# Patient Record
Sex: Male | Born: 1975 | Race: White | Hispanic: No | Marital: Married | Smoking: Never smoker
Health system: Southern US, Community
[De-identification: ages and names within clinical notes are randomized; demographics above are authoritative.]

## PROBLEM LIST (undated history)

## (undated) DIAGNOSIS — G4730 Sleep apnea, unspecified: Secondary | ICD-10-CM

## (undated) DIAGNOSIS — I2510 Atherosclerotic heart disease of native coronary artery without angina pectoris: Secondary | ICD-10-CM

## (undated) DIAGNOSIS — M5412 Radiculopathy, cervical region: Principal | ICD-10-CM

## (undated) DIAGNOSIS — R29898 Other symptoms and signs involving the musculoskeletal system: Secondary | ICD-10-CM

## (undated) DIAGNOSIS — I219 Acute myocardial infarction, unspecified: Secondary | ICD-10-CM

## (undated) DIAGNOSIS — Z87442 Personal history of urinary calculi: Secondary | ICD-10-CM

## (undated) DIAGNOSIS — E785 Hyperlipidemia, unspecified: Secondary | ICD-10-CM

## (undated) DIAGNOSIS — E781 Pure hyperglyceridemia: Secondary | ICD-10-CM

## (undated) DIAGNOSIS — F429 Obsessive-compulsive disorder, unspecified: Secondary | ICD-10-CM

## (undated) DIAGNOSIS — F32A Depression, unspecified: Secondary | ICD-10-CM

## (undated) DIAGNOSIS — K219 Gastro-esophageal reflux disease without esophagitis: Secondary | ICD-10-CM

## (undated) DIAGNOSIS — F329 Major depressive disorder, single episode, unspecified: Secondary | ICD-10-CM

## (undated) DIAGNOSIS — I10 Essential (primary) hypertension: Secondary | ICD-10-CM

## (undated) HISTORY — DX: Pure hyperglyceridemia: E78.1

## (undated) HISTORY — PX: TONSILLECTOMY: SUR1361

## (undated) HISTORY — DX: Hyperlipidemia, unspecified: E78.5

## (undated) HISTORY — DX: Radiculopathy, cervical region: M54.12

## (undated) HISTORY — DX: Other symptoms and signs involving the musculoskeletal system: R29.898

---

## 2014-03-08 ENCOUNTER — Encounter (HOSPITAL_COMMUNITY): Payer: Self-pay | Admitting: Emergency Medicine

## 2014-03-08 ENCOUNTER — Emergency Department (HOSPITAL_COMMUNITY): Payer: BC Managed Care – PPO

## 2014-03-08 ENCOUNTER — Emergency Department (HOSPITAL_COMMUNITY)
Admission: EM | Admit: 2014-03-08 | Discharge: 2014-03-09 | Disposition: A | Discharge status: Home / Self Care | Payer: BC Managed Care – PPO | Attending: Emergency Medicine | Admitting: Emergency Medicine

## 2014-03-08 DIAGNOSIS — Z880 Allergy status to penicillin: Secondary | ICD-10-CM | POA: Insufficient documentation

## 2014-03-08 DIAGNOSIS — N200 Calculus of kidney: Secondary | ICD-10-CM

## 2014-03-08 LAB — URINALYSIS, ROUTINE W REFLEX MICROSCOPIC
Bilirubin Urine: NEGATIVE
GLUCOSE, UA: NEGATIVE mg/dL
Hgb urine dipstick: NEGATIVE
LEUKOCYTES UA: NEGATIVE
NITRITE: NEGATIVE
PROTEIN: NEGATIVE mg/dL
Specific Gravity, Urine: 1.02 (ref 1.005–1.030)
Urobilinogen, UA: 0.2 mg/dL (ref 0.0–1.0)
pH: 7 (ref 5.0–8.0)

## 2014-03-08 LAB — CBC WITH DIFFERENTIAL/PLATELET
BASOS ABS: 0 10*3/uL (ref 0.0–0.1)
Basophils Relative: 0 % (ref 0–1)
EOS ABS: 0.1 10*3/uL (ref 0.0–0.7)
EOS PCT: 1 % (ref 0–5)
HCT: 44.6 % (ref 39.0–52.0)
Hemoglobin: 15.3 g/dL (ref 13.0–17.0)
Lymphocytes Relative: 14 % (ref 12–46)
Lymphs Abs: 1.6 10*3/uL (ref 0.7–4.0)
MCH: 27.6 pg (ref 26.0–34.0)
MCHC: 34.3 g/dL (ref 30.0–36.0)
MCV: 80.4 fL (ref 78.0–100.0)
MONO ABS: 0.5 10*3/uL (ref 0.1–1.0)
Monocytes Relative: 4 % (ref 3–12)
Neutro Abs: 9.2 10*3/uL — ABNORMAL HIGH (ref 1.7–7.7)
Neutrophils Relative %: 81 % — ABNORMAL HIGH (ref 43–77)
PLATELETS: 223 10*3/uL (ref 150–400)
RBC: 5.55 MIL/uL (ref 4.22–5.81)
RDW: 12.8 % (ref 11.5–15.5)
WBC: 11.4 10*3/uL — ABNORMAL HIGH (ref 4.0–10.5)

## 2014-03-08 LAB — COMPREHENSIVE METABOLIC PANEL
ALT: 53 U/L (ref 0–53)
AST: 31 U/L (ref 0–37)
Albumin: 4.5 g/dL (ref 3.5–5.2)
Alkaline Phosphatase: 84 U/L (ref 39–117)
Anion gap: 13 (ref 5–15)
BUN: 15 mg/dL (ref 6–23)
CALCIUM: 9.1 mg/dL (ref 8.4–10.5)
CO2: 25 mEq/L (ref 19–32)
Chloride: 104 mEq/L (ref 96–112)
Creatinine, Ser: 1 mg/dL (ref 0.50–1.35)
GFR calc non Af Amer: >90 mL/min (ref >90)
GLUCOSE: 118 mg/dL — AB (ref 70–99)
Potassium: 4 mEq/L (ref 3.7–5.3)
SODIUM: 142 meq/L (ref 137–147)
TOTAL PROTEIN: 7.1 g/dL (ref 6.0–8.3)
Total Bilirubin: 0.3 mg/dL (ref 0.3–1.2)

## 2014-03-08 LAB — LIPASE, BLOOD: LIPASE: 26 U/L (ref 11–59)

## 2014-03-08 MED ORDER — IOHEXOL 300 MG/ML  SOLN
50.0000 mL | Freq: Once | INTRAMUSCULAR | Status: AC | PRN
  Administered 2014-03-08: 50 mL via ORAL

## 2014-03-08 MED ORDER — ONDANSETRON HCL 4 MG PO TABS: 4.0000 mg | ORAL_TABLET | Freq: Four times a day (QID) | ORAL | Status: DC | PRN

## 2014-03-08 MED ORDER — HYDROMORPHONE HCL PF 1 MG/ML IJ SOLN
1.0000 mg | Freq: Once | INTRAMUSCULAR | Status: AC
  Administered 2014-03-08: 1 mg via INTRAVENOUS
  Filled 2014-03-08: qty 1

## 2014-03-08 MED ORDER — MORPHINE SULFATE 4 MG/ML IJ SOLN
4.0000 mg | Freq: Once | INTRAMUSCULAR | Status: AC
  Administered 2014-03-08: 4 mg via INTRAVENOUS
  Filled 2014-03-08: qty 1

## 2014-03-08 MED ORDER — KETOROLAC TROMETHAMINE 30 MG/ML IJ SOLN
30.0000 mg | Freq: Once | INTRAMUSCULAR | Status: AC
  Administered 2014-03-08: 30 mg via INTRAVENOUS
  Filled 2014-03-08: qty 1

## 2014-03-08 MED ORDER — OXYCODONE-ACETAMINOPHEN 5-325 MG PO TABS
1.0000 | ORAL_TABLET | Freq: Once | ORAL | Status: AC
  Administered 2014-03-09: 1 via ORAL
  Filled 2014-03-08: qty 1

## 2014-03-08 MED ORDER — IOHEXOL 300 MG/ML  SOLN
100.0000 mL | Freq: Once | INTRAMUSCULAR | Status: AC | PRN
  Administered 2014-03-08: 100 mL via INTRAVENOUS

## 2014-03-08 MED ORDER — SODIUM CHLORIDE 0.9 % IV BOLUS (SEPSIS)
1000.0000 mL | Freq: Once | INTRAVENOUS | Status: AC
  Administered 2014-03-08: 1000 mL via INTRAVENOUS

## 2014-03-08 MED ORDER — ONDANSETRON HCL 4 MG/2ML IJ SOLN
4.0000 mg | Freq: Once | INTRAMUSCULAR | Status: AC
  Administered 2014-03-08: 4 mg via INTRAMUSCULAR
  Filled 2014-03-08: qty 2

## 2014-03-08 MED ORDER — OXYCODONE-ACETAMINOPHEN 5-325 MG PO TABS: 1.0000 | ORAL_TABLET | Freq: Four times a day (QID) | ORAL | Status: DC | PRN

## 2014-03-08 NOTE — ED Notes (Signed)
Dr. Horton at bedside at this time.  

## 2014-03-08 NOTE — ED Provider Notes (Signed)
CSN: [id number]     Arrival date & time 03/08/14  2019 History   First MD Initiated Contact with Patient 03/08/14 2128     Chief Complaint  Patient presents with  . Abdominal Pain  . Emesis     (Consider location/radiation/quality/duration/timing/severity/associated sxs/prior Treatment) HPI  Patient presents with 2 hours of right sided flank and RLQ pain.  Patient reports acute onset of sharp, radiating right sided flank and RLQ pain.  Rates pain at 10/10.  Nothing makes it better or worse.  States "I can't get comfortable."  Reports nonbilious, nonbloody emesis.  No diarrhea.  Denies dysuria or hematuria.  Denies fevers.  Hx of kidney stones but thinks this is worse.  History reviewed. No pertinent past medical history. Past Surgical History  Procedure Laterality Date  . Tonsillectomy     History reviewed. No pertinent family history. History  Substance Use Topics  . Smoking status: Never Smoker   . Smokeless tobacco: Not on file  . Alcohol Use: No    Review of Systems  Constitutional: Negative.  Negative for fever.  Respiratory: Negative.  Negative for chest tightness and shortness of breath.   Cardiovascular: Negative.  Negative for chest pain.  Gastrointestinal: Positive for nausea, vomiting and abdominal pain. Negative for diarrhea.  Genitourinary: Positive for flank pain. Negative for dysuria and hematuria.  Musculoskeletal: Negative for back pain.  Neurological: Negative for headaches.  All other systems reviewed and are negative.     Allergies  Amoxicillin and Penicillins  Home Medications   Prior to Admission medications   Medication Sig Start Date End Date Taking? Authorizing Provider  ondansetron (ZOFRAN) 4 MG tablet Take 1 tablet (4 mg total) by mouth every 6 (six) hours as needed for nausea or vomiting. 03/08/14   Shon Baton, MD  oxyCODONE-acetaminophen (PERCOCET/ROXICET) 5-325 MG per tablet Take 1-2 tablets by mouth every 6 (six) hours as needed  for moderate pain or severe pain. 03/08/14   Shon Baton, MD   BP 162/104  Pulse 90  Temp(Src) 97.9 F (36.6 C) (Oral)  Resp 16  Ht 5\' 9"  (1.753 m)  Wt 210 lb (95.255 kg)  BMI 31.00 kg/m2  SpO2 97% Physical Exam  Nursing note and vitals reviewed. Constitutional: He is oriented to person, place, and time. He appears well-developed and well-nourished.  Uncomfortable appearing  HENT:  Head: Normocephalic and atraumatic.  Cardiovascular: Normal rate, regular rhythm and normal heart sounds.   No murmur heard. Pulmonary/Chest: Effort normal and breath sounds normal. No respiratory distress. He has no wheezes.  Abdominal: Soft. Bowel sounds are normal. There is tenderness. There is no rebound and no guarding.  RLQ tenderness  Musculoskeletal: He exhibits no edema.  Lymphadenopathy:    He has no cervical adenopathy.  Neurological: He is alert and oriented to person, place, and time.  Skin: Skin is warm and dry.  Psychiatric: He has a normal mood and affect.    ED Course  Procedures (including critical care time) Labs Review Labs Reviewed  CBC WITH DIFFERENTIAL - Abnormal; Notable for the following:    WBC 11.4 (*)    Neutrophils Relative % 81 (*)    Neutro Abs 9.2 (*)    All other components within normal limits  COMPREHENSIVE METABOLIC PANEL - Abnormal; Notable for the following:    Glucose, Bld 118 (*)    All other components within normal limits  URINALYSIS, ROUTINE W REFLEX MICROSCOPIC - Abnormal; Notable for the following:    Ketones, ur  TRACE (*)    All other components within normal limits  URINE CULTURE  LIPASE, BLOOD    Imaging Review Ct Abdomen Pelvis W Contrast  03/08/2014   CLINICAL DATA:  Sudden onset of right flank pain and right lower quadrant abdominal pain. Vomiting.  EXAM: CT ABDOMEN AND PELVIS WITH CONTRAST  TECHNIQUE: Multidetector CT imaging of the abdomen and pelvis was performed using the standard protocol following bolus administration of  intravenous contrast.  CONTRAST:  100 mL of Omnipaque 300 IV contrast  COMPARISON:  None.  FINDINGS: The visualized lung bases are clear.  There appears to be mild fatty infiltration within the liver, with mild sparing seen anteriorly. The spleen is unremarkable in appearance. The liver is otherwise grossly unremarkable. The gallbladder is within normal limits. The pancreas and adrenal glands are unremarkable.  There is mild right-sided hydronephrosis, with diffuse right-sided perinephric fluid and mildly decreased right renal enhancement, and prominence of the right ureter to the level of an obstructing 5 mm stone in the distal right ureter, just above the right vesicoureteral junction. Decreased renal enhancement raises question for underlying pyelonephritis. The left kidney is unremarkable in appearance. No nonobstructing renal stones are identified.  No free fluid is identified. The small bowel is unremarkable in appearance. The stomach is within normal limits. No acute vascular abnormalities are seen.  The appendix is normal in caliber and contains air, without evidence for appendicitis. The colon is unremarkable in appearance.  The bladder is mildly distended and grossly unremarkable. The prostate remains normal in size. No inguinal lymphadenopathy is seen.  No acute osseous abnormalities are identified.  IMPRESSION: 1. Mild right-sided hydronephrosis, with diffuse right-sided perinephric fluid, and an obstructing 5 mm stone in the distal right ureter, just above the right vesicoureteral junction. 2. Mildly decreased right renal enhancement raises question for underlying pyelonephritis. Suggest clinical correlation. 3. Mild fatty infiltration within the liver.   Electronically Signed   By: Roanna RaiderJeffery  Chang M.D.   On: 03/08/2014 23:23     EKG Interpretation None      MDM   Final diagnoses:  Kidney stones    Patient presents with RLQ and right flank pain.  Uncomfortable on exam but nontoxic.  Given  abrupt onset, hight suspicion for kidney stone.  However, appendicitis also a consideration.  Patient given fluids and pain meds.  Labs reassruing.  CT shows 5mm stone in the right distal ureter.  Patient able to orally hydrate and transitioned to PO pain control.  Will refer to urology and given expectant management.  After history, exam, and medical workup I feel the patient has been appropriately medically screened and is safe for discharge home. Pertinent diagnoses were discussed with the patient. Patient was given return precautions.     Shon Batonourtney F Ceonna Frazzini, MD 03/10/14 [phone]

## 2014-03-08 NOTE — ED Notes (Signed)
Radiology tech at bedside giving instructions on oral contrast

## 2014-03-08 NOTE — ED Notes (Signed)
Pt returned from ct scan

## 2014-03-08 NOTE — ED Notes (Signed)
Patient complaining of right lower abdominal pain that started suddenly approximately an hour ago. Also reports vomited x 2.

## 2014-03-08 NOTE — Discharge Instructions (Signed)

## 2014-03-08 NOTE — ED Notes (Signed)
Pt transported to ct scan at this time.

## 2014-03-10 LAB — URINE CULTURE
Colony Count: NO GROWTH
Culture: NO GROWTH

## 2015-08-21 DIAGNOSIS — I219 Acute myocardial infarction, unspecified: Secondary | ICD-10-CM

## 2015-08-21 DIAGNOSIS — I2510 Atherosclerotic heart disease of native coronary artery without angina pectoris: Secondary | ICD-10-CM

## 2015-08-21 HISTORY — DX: Atherosclerotic heart disease of native coronary artery without angina pectoris: I25.10

## 2015-08-21 HISTORY — DX: Acute myocardial infarction, unspecified: I21.9

## 2015-09-15 ENCOUNTER — Encounter (HOSPITAL_COMMUNITY)
Admission: EM | Disposition: A | Discharge status: Home / Self Care | Payer: BLUE CROSS/BLUE SHIELD | Source: Home / Self Care | Attending: Cardiovascular Disease

## 2015-09-15 ENCOUNTER — Inpatient Hospital Stay (HOSPITAL_COMMUNITY)
Admission: EM | Admit: 2015-09-15 | Discharge: 2015-09-18 | DRG: 247 | Disposition: A | Discharge status: Home / Self Care | Payer: BLUE CROSS/BLUE SHIELD | Attending: Cardiovascular Disease | Admitting: Cardiovascular Disease

## 2015-09-15 ENCOUNTER — Encounter (HOSPITAL_COMMUNITY): Payer: Self-pay

## 2015-09-15 ENCOUNTER — Emergency Department (HOSPITAL_COMMUNITY): Payer: BLUE CROSS/BLUE SHIELD

## 2015-09-15 ENCOUNTER — Encounter (HOSPITAL_COMMUNITY)
Admission: EM | Disposition: A | Discharge status: Home / Self Care | Payer: Self-pay | Source: Home / Self Care | Attending: Cardiovascular Disease

## 2015-09-15 DIAGNOSIS — R7303 Prediabetes: Secondary | ICD-10-CM | POA: Diagnosis present

## 2015-09-15 DIAGNOSIS — I213 ST elevation (STEMI) myocardial infarction of unspecified site: Secondary | ICD-10-CM | POA: Diagnosis present

## 2015-09-15 DIAGNOSIS — Z87442 Personal history of urinary calculi: Secondary | ICD-10-CM | POA: Diagnosis not present

## 2015-09-15 DIAGNOSIS — I10 Essential (primary) hypertension: Secondary | ICD-10-CM | POA: Diagnosis not present

## 2015-09-15 DIAGNOSIS — Z6833 Body mass index (BMI) 33.0-33.9, adult: Secondary | ICD-10-CM

## 2015-09-15 DIAGNOSIS — Z955 Presence of coronary angioplasty implant and graft: Secondary | ICD-10-CM | POA: Insufficient documentation

## 2015-09-15 DIAGNOSIS — I119 Hypertensive heart disease without heart failure: Secondary | ICD-10-CM | POA: Diagnosis present

## 2015-09-15 DIAGNOSIS — E781 Pure hyperglyceridemia: Secondary | ICD-10-CM | POA: Diagnosis present

## 2015-09-15 DIAGNOSIS — I2119 ST elevation (STEMI) myocardial infarction involving other coronary artery of inferior wall: Secondary | ICD-10-CM | POA: Diagnosis not present

## 2015-09-15 DIAGNOSIS — R739 Hyperglycemia, unspecified: Secondary | ICD-10-CM | POA: Diagnosis not present

## 2015-09-15 DIAGNOSIS — Z8249 Family history of ischemic heart disease and other diseases of the circulatory system: Secondary | ICD-10-CM | POA: Diagnosis not present

## 2015-09-15 DIAGNOSIS — I2510 Atherosclerotic heart disease of native coronary artery without angina pectoris: Secondary | ICD-10-CM | POA: Diagnosis present

## 2015-09-15 DIAGNOSIS — I425 Other restrictive cardiomyopathy: Secondary | ICD-10-CM | POA: Diagnosis not present

## 2015-09-15 DIAGNOSIS — I1A0 Resistant hypertension: Secondary | ICD-10-CM | POA: Diagnosis present

## 2015-09-15 DIAGNOSIS — E785 Hyperlipidemia, unspecified: Secondary | ICD-10-CM | POA: Diagnosis not present

## 2015-09-15 DIAGNOSIS — Z880 Allergy status to penicillin: Secondary | ICD-10-CM

## 2015-09-15 DIAGNOSIS — I2111 ST elevation (STEMI) myocardial infarction involving right coronary artery: Secondary | ICD-10-CM | POA: Diagnosis present

## 2015-09-15 DIAGNOSIS — E668 Other obesity: Secondary | ICD-10-CM | POA: Diagnosis present

## 2015-09-15 DIAGNOSIS — R7989 Other specified abnormal findings of blood chemistry: Secondary | ICD-10-CM | POA: Diagnosis present

## 2015-09-15 HISTORY — PX: CARDIAC CATHETERIZATION: SHX172

## 2015-09-15 HISTORY — DX: Atherosclerotic heart disease of native coronary artery without angina pectoris: I25.10

## 2015-09-15 HISTORY — DX: Essential (primary) hypertension: I10

## 2015-09-15 HISTORY — DX: Acute myocardial infarction, unspecified: I21.9

## 2015-09-15 LAB — I-STAT TROPONIN, ED: TROPONIN I, POC: 0.03 ng/mL (ref 0.00–0.08)

## 2015-09-15 LAB — COMPREHENSIVE METABOLIC PANEL WITH GFR
ALT: 54 U/L (ref 17–63)
AST: 36 U/L (ref 15–41)
Albumin: 4 g/dL (ref 3.5–5.0)
Alkaline Phosphatase: 66 U/L (ref 38–126)
Anion gap: 14 (ref 5–15)
BUN: 13 mg/dL (ref 6–20)
CO2: 25 mmol/L (ref 22–32)
Calcium: 9.3 mg/dL (ref 8.9–10.3)
Chloride: 101 mmol/L (ref 101–111)
Creatinine, Ser: 0.84 mg/dL (ref 0.61–1.24)
GFR calc Af Amer: >60 mL/min
GFR calc non Af Amer: >60 mL/min
Glucose, Bld: 144 mg/dL — ABNORMAL HIGH (ref 65–99)
Potassium: 4.5 mmol/L (ref 3.5–5.1)
Sodium: 140 mmol/L (ref 135–145)
Total Bilirubin: 0.8 mg/dL (ref 0.3–1.2)
Total Protein: 6.4 g/dL — ABNORMAL LOW (ref 6.5–8.1)

## 2015-09-15 LAB — RAPID URINE DRUG SCREEN, HOSP PERFORMED
Amphetamines: NOT DETECTED
Barbiturates: NOT DETECTED
Benzodiazepines: POSITIVE — AB
Cocaine: NOT DETECTED
Opiates: NOT DETECTED
Tetrahydrocannabinol: NOT DETECTED

## 2015-09-15 LAB — CBC
HEMATOCRIT: 48.6 % (ref 39.0–52.0)
HEMOGLOBIN: 16.3 g/dL (ref 13.0–17.0)
MCH: 27.4 pg (ref 26.0–34.0)
MCHC: 33.5 g/dL (ref 30.0–36.0)
MCV: 81.8 fL (ref 78.0–100.0)
Platelets: 249 10*3/uL (ref 150–400)
RBC: 5.94 MIL/uL — ABNORMAL HIGH (ref 4.22–5.81)
RDW: 13.6 % (ref 11.5–15.5)
WBC: 14.1 10*3/uL — ABNORMAL HIGH (ref 4.0–10.5)

## 2015-09-15 LAB — TROPONIN I: TROPONIN I: 16.7 ng/mL — AB (ref <0.031)

## 2015-09-15 LAB — POCT ACTIVATED CLOTTING TIME: ACTIVATED CLOTTING TIME: 322 s

## 2015-09-15 LAB — POCT I-STAT 3, ART BLOOD GAS (G3+)
ACID-BASE DEFICIT: 3 mmol/L — AB (ref 0.0–2.0)
Bicarbonate: 22.4 mEq/L (ref 20.0–24.0)
O2 SAT: 99 %
PCO2 ART: 40.3 mmHg (ref 35.0–45.0)
TCO2: 24 mmol/L (ref 0–100)
pH, Arterial: 7.352 (ref 7.350–7.450)
pO2, Arterial: 158 mmHg — ABNORMAL HIGH (ref 80.0–100.0)

## 2015-09-15 LAB — PROTIME-INR
INR: 0.97 (ref 0.00–1.49)
Prothrombin Time: 13.1 s (ref 11.6–15.2)

## 2015-09-15 LAB — MRSA PCR SCREENING: MRSA by PCR: NEGATIVE

## 2015-09-15 LAB — APTT: aPTT: 28 seconds (ref 24–37)

## 2015-09-15 SURGERY — LEFT HEART CATH AND CORONARY ANGIOGRAPHY
Anesthesia: LOCAL

## 2015-09-15 MED ORDER — MORPHINE SULFATE (PF) 2 MG/ML IV SOLN
2.0000 mg | INTRAVENOUS | Status: DC | PRN
  Administered 2015-09-15 (×2): 2 mg via INTRAVENOUS
  Filled 2015-09-15 (×3): qty 1

## 2015-09-15 MED ORDER — METOPROLOL TARTRATE 12.5 MG HALF TABLET: 12.5000 mg | ORAL_TABLET | Freq: Four times a day (QID) | ORAL | Status: DC

## 2015-09-15 MED ORDER — METOPROLOL TARTRATE 12.5 MG HALF TABLET
12.5000 mg | ORAL_TABLET | Freq: Two times a day (BID) | ORAL | Status: DC
  Administered 2015-09-15 (×2): 12.5 mg via ORAL
  Filled 2015-09-15 (×2): qty 1

## 2015-09-15 MED ORDER — SODIUM CHLORIDE 0.9 % IV SOLN
INTRAVENOUS | Status: DC
  Administered 2015-09-15 (×2): via INTRAVENOUS

## 2015-09-15 MED ORDER — LIDOCAINE HCL (PF) 1 % IJ SOLN
INTRAMUSCULAR | Status: AC
  Filled 2015-09-15: qty 30

## 2015-09-15 MED ORDER — NITROGLYCERIN IN D5W 200-5 MCG/ML-% IV SOLN
0.0000 ug/min | INTRAVENOUS | Status: DC
  Administered 2015-09-15: 33 ug/min via INTRAVENOUS
  Administered 2015-09-15: 26 ug/min via INTRAVENOUS
  Administered 2015-09-15: 36 ug/min via INTRAVENOUS
  Administered 2015-09-15: 20 ug/min via INTRAVENOUS
  Filled 2015-09-15: qty 250

## 2015-09-15 MED ORDER — TICAGRELOR 90 MG PO TABS
ORAL_TABLET | ORAL | Status: DC | PRN
  Administered 2015-09-15: 180 mg via ORAL

## 2015-09-15 MED ORDER — MIDAZOLAM HCL 2 MG/2ML IJ SOLN
INTRAMUSCULAR | Status: DC | PRN
  Administered 2015-09-15: 2 mg via INTRAVENOUS

## 2015-09-15 MED ORDER — HEPARIN SODIUM (PORCINE) 5000 UNIT/ML IJ SOLN
4000.0000 [IU] | INTRAMUSCULAR | Status: AC
  Administered 2015-09-15: 4000 [IU] via INTRAVENOUS

## 2015-09-15 MED ORDER — MIDAZOLAM HCL 2 MG/2ML IJ SOLN
INTRAMUSCULAR | Status: AC
  Filled 2015-09-15: qty 2

## 2015-09-15 MED ORDER — SODIUM CHLORIDE 0.9 % IV SOLN: 250.0000 mL | INTRAVENOUS | Status: DC | PRN

## 2015-09-15 MED ORDER — TICAGRELOR 90 MG PO TABS
ORAL_TABLET | ORAL | Status: AC
  Filled 2015-09-15: qty 1

## 2015-09-15 MED ORDER — TICAGRELOR 90 MG PO TABS
90.0000 mg | ORAL_TABLET | Freq: Two times a day (BID) | ORAL | Status: DC
  Administered 2015-09-15 – 2015-09-17 (×4): 90 mg via ORAL
  Filled 2015-09-15 (×4): qty 1

## 2015-09-15 MED ORDER — ASPIRIN EC 81 MG PO TBEC
81.0000 mg | DELAYED_RELEASE_TABLET | Freq: Every day | ORAL | Status: DC
  Administered 2015-09-15 – 2015-09-18 (×4): 81 mg via ORAL
  Filled 2015-09-15 (×4): qty 1

## 2015-09-15 MED ORDER — VERAPAMIL HCL 2.5 MG/ML IV SOLN
INTRAVENOUS | Status: AC
  Filled 2015-09-15: qty 2

## 2015-09-15 MED ORDER — ACETAMINOPHEN 325 MG PO TABS
650.0000 mg | ORAL_TABLET | ORAL | Status: DC | PRN
  Administered 2015-09-16 (×2): 650 mg via ORAL
  Filled 2015-09-15 (×2): qty 2

## 2015-09-15 MED ORDER — FENTANYL CITRATE (PF) 100 MCG/2ML IJ SOLN
INTRAMUSCULAR | Status: AC
  Filled 2015-09-15: qty 2

## 2015-09-15 MED ORDER — NITROGLYCERIN 1 MG/10 ML FOR IR/CATH LAB
INTRA_ARTERIAL | Status: AC
  Filled 2015-09-15: qty 10

## 2015-09-15 MED ORDER — METOPROLOL TARTRATE 1 MG/ML IV SOLN
5.0000 mg | Freq: Once | INTRAVENOUS | Status: AC
  Administered 2015-09-15: 5 mg via INTRAVENOUS
  Filled 2015-09-15: qty 5

## 2015-09-15 MED ORDER — NITROGLYCERIN 0.4 MG SL SUBL
0.4000 mg | SUBLINGUAL_TABLET | SUBLINGUAL | Status: DC | PRN
  Administered 2015-09-15 (×2): 0.4 mg via SUBLINGUAL

## 2015-09-15 MED ORDER — HEPARIN SODIUM (PORCINE) 5000 UNIT/ML IJ SOLN
INTRAMUSCULAR | Status: AC
  Filled 2015-09-15: qty 1

## 2015-09-15 MED ORDER — ATORVASTATIN CALCIUM 80 MG PO TABS
80.0000 mg | ORAL_TABLET | Freq: Every day | ORAL | Status: DC
  Administered 2015-09-15 – 2015-09-17 (×3): 80 mg via ORAL
  Filled 2015-09-15 (×3): qty 1

## 2015-09-15 MED ORDER — LISINOPRIL 2.5 MG PO TABS
2.5000 mg | ORAL_TABLET | Freq: Every day | ORAL | Status: DC
  Administered 2015-09-15 – 2015-09-16 (×2): 2.5 mg via ORAL
  Filled 2015-09-15 (×2): qty 1

## 2015-09-15 MED ORDER — SODIUM CHLORIDE 0.9 % IV SOLN
250.0000 mg | INTRAVENOUS | Status: DC | PRN
  Administered 2015-09-15: 1.75 mg/kg/h via INTRAVENOUS

## 2015-09-15 MED ORDER — SODIUM CHLORIDE 0.9 % IV SOLN
INTRAVENOUS | Status: DC
  Administered 2015-09-15: 999 mL via INTRAVENOUS

## 2015-09-15 MED ORDER — SODIUM CHLORIDE 0.9 % IV SOLN
1.7500 mg/kg/h | INTRAVENOUS | Status: AC
  Filled 2015-09-15: qty 250

## 2015-09-15 MED ORDER — BIVALIRUDIN BOLUS VIA INFUSION - CUPID
INTRAVENOUS | Status: DC | PRN
  Administered 2015-09-15: 76.575 mg via INTRAVENOUS

## 2015-09-15 MED ORDER — VERAPAMIL HCL 2.5 MG/ML IV SOLN
INTRA_ARTERIAL | Status: DC | PRN
  Administered 2015-09-15: 06:00:00 via INTRA_ARTERIAL

## 2015-09-15 MED ORDER — HEPARIN (PORCINE) IN NACL 2-0.9 UNIT/ML-% IJ SOLN
INTRAMUSCULAR | Status: AC
  Filled 2015-09-15: qty 1000

## 2015-09-15 MED ORDER — LIDOCAINE HCL (PF) 1 % IJ SOLN
INTRAMUSCULAR | Status: DC | PRN
  Administered 2015-09-15: 07:00:00

## 2015-09-15 MED ORDER — ONDANSETRON HCL 4 MG/2ML IJ SOLN: 4.0000 mg | Freq: Four times a day (QID) | INTRAMUSCULAR | Status: DC | PRN

## 2015-09-15 MED ORDER — BIVALIRUDIN 250 MG IV SOLR
INTRAVENOUS | Status: AC
  Filled 2015-09-15: qty 250

## 2015-09-15 MED ORDER — NITROGLYCERIN IN D5W 200-5 MCG/ML-% IV SOLN
10.0000 ug/min | INTRAVENOUS | Status: DC
  Administered 2015-09-15: 10 ug/min via INTRAVENOUS

## 2015-09-15 MED ORDER — ASPIRIN 81 MG PO CHEW
324.0000 mg | CHEWABLE_TABLET | Freq: Once | ORAL | Status: DC
Start: 2015-09-15 — End: 2015-09-15

## 2015-09-15 MED ORDER — SODIUM CHLORIDE 0.9% FLUSH
3.0000 mL | Freq: Two times a day (BID) | INTRAVENOUS | Status: DC
  Administered 2015-09-15: 10:00:00 via INTRAVENOUS
  Administered 2015-09-15 – 2015-09-16 (×2): 3 mL via INTRAVENOUS

## 2015-09-15 MED ORDER — NITROGLYCERIN IN D5W 200-5 MCG/ML-% IV SOLN
INTRAVENOUS | Status: AC
  Filled 2015-09-15: qty 250

## 2015-09-15 MED ORDER — FENTANYL CITRATE (PF) 100 MCG/2ML IJ SOLN
INTRAMUSCULAR | Status: DC | PRN
  Administered 2015-09-15 (×2): 50 ug via INTRAVENOUS

## 2015-09-15 MED ORDER — TICAGRELOR 90 MG PO TABS
ORAL_TABLET | ORAL | Status: AC
Start: 2015-09-15 — End: 2015-09-15
  Filled 2015-09-15: qty 1

## 2015-09-15 MED ORDER — SODIUM CHLORIDE 0.9% FLUSH: 3.0000 mL | INTRAVENOUS | Status: DC | PRN

## 2015-09-15 SURGICAL SUPPLY — 20 items
BALLN EUPHORA RX 2.0X12 (BALLOONS) ×2
BALLN ~~LOC~~ TREK RX 3.75X12 (BALLOONS) ×2
BALLOON EUPHORA RX 2.0X12 (BALLOONS) ×1 IMPLANT
BALLOON ~~LOC~~ TREK RX 3.75X12 (BALLOONS) ×1 IMPLANT
CATH INFINITI 5 FR JL3.5 (CATHETERS) ×2 IMPLANT
CATH INFINITI 5FR MULTPACK ANG (CATHETERS) ×2 IMPLANT
CATH VISTA GUIDE 6FR JR4 (CATHETERS) ×2 IMPLANT
DEVICE RAD COMP TR BAND LRG (VASCULAR PRODUCTS) ×2 IMPLANT
GLIDESHEATH SLEND A-KIT 6F 22G (SHEATH) ×2 IMPLANT
KIT ENCORE 26 ADVANTAGE (KITS) ×2 IMPLANT
KIT HEART LEFT (KITS) ×2 IMPLANT
PACK CARDIAC CATHETERIZATION (CUSTOM PROCEDURE TRAY) ×2 IMPLANT
SHEATH PINNACLE 6F 10CM (SHEATH) ×2 IMPLANT
STENT XIENCE ALPINE RX 3.5X15 (Permanent Stent) ×2 IMPLANT
SYR MEDRAD MARK V 150ML (SYRINGE) ×2 IMPLANT
TRANSDUCER W/STOPCOCK (MISCELLANEOUS) ×2 IMPLANT
TUBING CIL FLEX 10 FLL-RA (TUBING) ×2 IMPLANT
WIRE COUGAR XT STRL 190CM (WIRE) ×2 IMPLANT
WIRE EMERALD 3MM-J .035X150CM (WIRE) IMPLANT
WIRE SAFE-T 1.5MM-J .035X260CM (WIRE) ×2 IMPLANT

## 2015-09-15 NOTE — ED Notes (Signed)
Code stemi ([facility] called @ [phone]).

## 2015-09-15 NOTE — Progress Notes (Signed)
Called regarding CP Pt given IV MSO4 and Lopressor IV Pain now almost gone BP 120  HR 110    Lungs CTA   Cardiac  RRR  No rub  No S3 Ext:  Feet warm  No edema  Will continue lopressor  Increase to 12.5 qid   Continue NTG.

## 2015-09-15 NOTE — Progress Notes (Signed)
At shift change, pt complained of 7/10 chest pain. EKG was performed and MD notified. Titrated nitroglycerin and administered morphine. Pt commented decreased pain 3/10. MD called back and recommended metoprolol to be given.

## 2015-09-15 NOTE — ED Notes (Signed)
To cath lab.

## 2015-09-15 NOTE — Care Management Note (Signed)
Case Management Note  Patient Details  Name: WOODIE LINDENMEYER MRN: [id number] Date of Birth: [birth year]  Subjective/Objective:      Adm w mi              Action/Plan: lives at home   Expected Discharge Date:                  Expected Discharge Plan:  Home/Self Care  In-House Referral:     Discharge planning Services  CM Consult, Medication Assistance  Post Acute Care Choice:    Choice offered to:     DME Arranged:    DME Agency:     HH Arranged:    HH Agency:     Status of Service:     Medicare Important Message Given:    Date Medicare IM Given:    Medicare IM give by:    Date Additional Medicare IM Given:    Additional Medicare Important Message give by:     If discussed at Long Length of Stay Meetings, dates discussed:    Additional Comments: ur review done, gave pt 30day free and copay card for brilinta. Pt has bc[employer] ins.  Hanley Hays, RN 09/15/2015, 9:55 AM

## 2015-09-15 NOTE — ED Notes (Signed)
Pt here for cp, arms and neck pain, hx of htn, 205/130 today, pt denies sob, dizzy or nausea ekg reads stemi, md notified.

## 2015-09-15 NOTE — ED Notes (Signed)
Pt reports onset of pain 1.5 hours ago,

## 2015-09-15 NOTE — Progress Notes (Signed)
CRITICAL VALUE ALERT  Critical value received:  Troponin 16.70  Date of notification:  09/15/2015  Time of notification:  1035  Critical value read back:Yes.    Nurse who received alert:  Morrie Sheldon  MD notified (1st page):  Expected results s/p STEMI Cath  Time of first page:  1030  MD notified (2nd page):  Time of second page:  Responding MD:  Expected results s/p STEMI Cath  Time MD responded:  1030

## 2015-09-15 NOTE — H&P (Addendum)
HPI:  40 y/o male with h/o obesity, HTN, hypertriglyceridemia and kidney stones. No known h/o CAD.   Woke up at approximately 345a with severe chest pain radiating to his back and both arms. No N/V or HF symptoms. Presented to [facility] ER and ECG c/w acute inferior STEMI. On arrival, SBP 210/115. Treated with ASA, heparin, NTG. POC troponin 0.03.  Denies recent bleeding.   Review of Systems:     Cardiac Review of Systems: {Y] = yes [ ]  = no  Chest Pain [ y   ]  Resting SOB [   ] Exertional SOB  [  ]  Orthopnea [  ]   Pedal Edema [   ]    Palpitations [  ] Syncope  [  ]   Presyncope [   ]  General Review of Systems: [Y] = yes [  ]=no Constitional: recent weight change [  ]; anorexia [  ]; fatigue [  ]; nausea [  ]; night sweats [  ]; fever [  ]; or chills [  ];                                                                      Dental: poor dentition[  ];   Eye : blurred vision [  ]; diplopia [   ]; vision changes [  ];  Amaurosis fugax[  ]; Resp: cough [  ];  wheezing[  ];  hemoptysis[  ]; shortness of breath[  ]; paroxysmal nocturnal dyspnea[  ]; dyspnea on exertion[  ]; or orthopnea[  ];  GI:  gallstones[  ], vomiting[  ];  dysphagia[  ]; melena[  ];  hematochezia [  ]; heartburn[  ];   GU: kidney stones [  ]; hematuria[  ];   dysuria [  ];  nocturia[  ];               Skin: rash [  ], swelling[  ];, hair loss[  ];  peripheral edema[  ];  or itching[  ]; Musculosketetal: myalgias[  ];  joint swelling[  ];  joint erythema[  ];  joint pain[  ];  back pain[  ];  Heme/Lymph: bruising[  ];  bleeding[  ];  anemia[  ];  Neuro: TIA[  ];  headaches[  ];  stroke[  ];  vertigo[  ];  seizures[  ];   paresthesias[  ];  difficulty walking[  ];  Psych:depression[  ]; anxiety[  ];  Endocrine: diabetes[  ];  thyroid dysfunction[  ];  Other:  Past Medical History  Diagnosis Date  . Hypertension    Home Meds:  - NONE  Allergies  Allergen Reactions  . Amoxicillin Rash  . Penicillins Rash     Social History   Social History  . Marital Status: Single    Spouse Name: N/A  . Number of Children: N/A  . Years of Education: N/A   Occupational History  . Not on file.   Social History Main Topics  . Smoking status: Never Smoker   . Smokeless tobacco: Not on file  . Alcohol Use: No  . Drug Use: No  . Sexual Activity: Not on file   Other Topics Concern  .  Not on file   Social History Narrative    Family History:  +CAD in father (stents)   PHYSICAL EXAM: Filed Vitals:   09/15/15 0450 09/15/15 0452  BP: 181/110 191/121  Pulse: 125 123  Temp:    Resp: 20 13   General: Obese young male uncomfortable from CP HEENT: normal Neck: supple. Thick. No obvious  JVD. Carotids 2+ bilat; no bruits. No lymphadenopathy or thryomegaly appreciated. Cor: PMI nondisplaced. Regular rate & rhythm. No rubs, gallops or murmurs. Lungs: clear Abdomen: obese soft, nontender, nondistended. No hepatosplenomegaly. No bruits or masses. Good bowel sounds. Extremities: no cyanosis, clubbing, rash, edema Neuro: alert & oriented x 3, cranial nerves grossly intact. moves all 4 extremities w/o difficulty. Affect pleasant.  ECG: Sinus tach 115 inferior ST elevation  Results for orders placed or performed during the hospital encounter of 09/15/15 (from the past 24 hour(s))  CBC     Status: Abnormal   Collection Time: 09/15/15  4:43 AM  Result Value Ref Range   WBC 14.1 (H) 4.0 - 10.5 K/uL   RBC 5.94 (H) 4.22 - 5.81 MIL/uL   Hemoglobin 16.3 13.0 - 17.0 g/dL   HCT 16.1 09.6 - 04.5 %   MCV 81.8 78.0 - 100.0 fL   MCH 27.4 26.0 - 34.0 pg   MCHC 33.5 30.0 - 36.0 g/dL   RDW 40.9 81.1 - 91.4 %   Platelets 249 150 - 400 K/uL  I-Stat Troponin, ED (not at [facility])     Status: None   Collection Time: 09/15/15  4:43 AM  Result Value Ref Range   Troponin i, poc 0.03 0.00 - 0.08 ng/mL   Comment 3           Dg Chest Portable 1 View  09/15/2015  CLINICAL DATA:  Acute onset of mid chest pain.   Initial encounter. EXAM: PORTABLE CHEST 1 VIEW COMPARISON:  None. FINDINGS: The lungs are mildly hypoexpanded. Mild vascular crowding and vascular congestion are noted. There is no evidence of focal opacification, pleural effusion or pneumothorax. The cardiomediastinal silhouette is within normal limits. No acute osseous abnormalities are seen. IMPRESSION: Lungs mildly hypoexpanded. Mild vascular congestion noted. Lungs remain grossly clear. Electronically Signed   By: Roanna Raider M.D.   On: 09/15/2015 04:58     ASSESSMENT:  1. Acute inferior ST elevation MI 2. Severe HTN, likely worsened by #1 3. Morbid obesity  PLAN/DISCUSSION:  ECG is consistent with acute inferior STEMI. He has been treated with ASA, heparin and NTG in ER. We will take to cath lab for emergent PCI. Suspect severe HTN due in part to pain, Start IV b-blocker and IV NTG gtt.. Check lipids. Will check UDS. Routine post-STEMI care to follow. With severe HTN, aortic dissection involving RCA also in the differential. Will assess at cath.   Richard Pizzo,MD 5:04 AM     Patient seen and examined. [name] is a 40 year old white male with a history of mild obesity.  In the past, he has been told to have marked upper triglyceridemia with triglycerides >1200, but was not started on medication and he wanted to initially  pursue a change in diet.  With diet change his triglycerides improved to  the 300 range.  He also has a history of hypertension but has not been on any medications.  There is a family history for coronary artery disease with his father having had several stents and as well as paternal grandfather.  He denies any history of  prior chest pain.  This morning approximately 3:30 AM he was awakened with severe substernal chest pressure.  Upon arrival to [facility] emergency room his ECG revealed sinus tachycardia with early ST segment inferior elevation and a code STEMI was activated. In the ER  the patient was treated with IV  nitroglycerin and heparin.  His chest pain improved.  He is taken acutely to the catheterization laboratory for emergent cardiac catheterization.  Social history is notable in that he is married and has 3 children.  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  There is no tobacco history.  Family history is notable that both parents are alive, father at age 18 and mother at 6.  His father had suffered several cardiac events and has had several stents.  The paternal grandfather also had myocardial infarction.  He has one sister age 41 who is alive and well.  ROS are otherwise negative. On physical exam, he was hypertensive on arrival.  He was mildly obese.  HEENT was unremarkable except for a Mallinpatti scale of 3.  His lungs were clear.  Rhythm was regular.  There was no gallop.  There was no murmur.  Abdomen was soft and nontender.  Pulses were 2+.  Distal pulses are 2+.  There was no clubbing cyanosis or edema.  Neurologic exam was nonfocal.  I discussed emergent catheterization with the patient who agrees to his procedure in an emergent fashion.   Lennette Bihari, MD, [facility] 09/15/2015 6:57 AM

## 2015-09-15 NOTE — ED Provider Notes (Signed)
CSN: [id number]     Arrival date & time 09/15/15  0424 History   First MD Initiated Contact with Patient 09/15/15 [phone]     Chief Complaint  Patient presents with  . Code STEMI      Patient is a 40 y.o. male presenting with chest pain. The history is provided by the patient.  Chest Pain Pain location:  Substernal area Pain quality: pressure   Radiates to: back. Pain radiates to the back: yes   Pain severity:  Severe Onset quality:  Sudden Duration:  1 hour Timing:  Constant Progression:  Worsening Chronicity:  New Relieved by:  Nothing Worsened by:  Nothing tried Ineffective treatments:  Aspirin Associated symptoms: shortness of breath   Associated symptoms: no fever   Risk factors: hypertension and male sex   Risk factors: no prior DVT/PE   patient reports he woke up one hour with chest pain.  It seems to radiate into back and he also has pain in both arms.  He has never had this before.    Family history - CAD Past Medical History  Diagnosis Date  . Hypertension    Past Surgical History  Procedure Laterality Date  . Tonsillectomy     History reviewed. No pertinent family history. Social History  Substance Use Topics  . Smoking status: Never Smoker   . Smokeless tobacco: None  . Alcohol Use: No    Review of Systems  Constitutional: Negative for fever.  Respiratory: Positive for shortness of breath.   Cardiovascular: Positive for chest pain.  Gastrointestinal: Negative for blood in stool.  Neurological: Negative for syncope.  All other systems reviewed and are negative.     Allergies  Amoxicillin and Penicillins  Home Medications   Prior to Admission medications   Medication Sig Start Date End Date Taking? Authorizing Provider  ondansetron (ZOFRAN) 4 MG tablet Take 1 tablet (4 mg total) by mouth every 6 (six) hours as needed for nausea or vomiting. 03/08/14   Shon Baton, MD  oxyCODONE-acetaminophen (PERCOCET/ROXICET) 5-325 MG per tablet Take  1-2 tablets by mouth every 6 (six) hours as needed for moderate pain or severe pain. 03/08/14   Shon Baton, MD   BP 205/130 mmHg  Pulse 124  Temp(Src) 98.8 F (37.1 C) (Oral)  Resp 21  Ht 5\' 9"  (1.753 m)  Wt 102.059 kg  BMI 33.21 kg/m2  SpO2 99% Physical Exam CONSTITUTIONAL: Well developed/well nourished, ill appearing HEAD: Normocephalic/atraumatic EYES: EOMI ENMT: Mucous membranes moist NECK: supple no meningeal signs SPINE/BACK:entire spine nontender CV: S1/S2 noted, no murmurs/rubs/gallops noted, tachycardic LUNGS: Lungs are clear to auscultation bilaterally, no apparent distress ABDOMEN: soft, nontender, no rebound or guarding, bowel sounds noted throughout abdomen GU:no cva tenderness NEURO: Pt is awake/alert/appropriate, moves all extremitiesx4.  No facial droop.   EXTREMITIES: pulses normal/equalx4, full ROM, no LE edema or tenderness SKIN: warm, color normal PSYCH: mildly anxious  ED Course  Procedures  CRITICAL CARE Performed by: Joya Gaskins Total critical care time: 35 minutes Critical care time was exclusive of separately billable procedures and treating other patients. Critical care was necessary to treat or prevent imminent or life-threatening deterioration. Critical care was time spent personally by me on the following activities: development of treatment plan with patient and/or surrogate as well as nursing, discussions with consultants, evaluation of patient's response to treatment, examination of patient, obtaining history from patient or surrogate, ordering and performing treatments and interventions, ordering and review of laboratory studies, ordering and review of radiographic  studies, pulse oximetry and re-evaluation of patient's condition.  Labs Review Labs Reviewed  CBC - Abnormal; Notable for the following:    WBC 14.1 (*)    RBC 5.94 (*)    All other components within normal limits  APTT  PROTIME-INR  COMPREHENSIVE METABOLIC PANEL   URINE RAPID DRUG SCREEN, HOSP PERFORMED  I-STAT TROPOININ, ED    Imaging Review Dg Chest Portable 1 View  09/15/2015  CLINICAL DATA:  Acute onset of mid chest pain.  Initial encounter. EXAM: PORTABLE CHEST 1 VIEW COMPARISON:  None. FINDINGS: The lungs are mildly hypoexpanded. Mild vascular crowding and vascular congestion are noted. There is no evidence of focal opacification, pleural effusion or pneumothorax. The cardiomediastinal silhouette is within normal limits. No acute osseous abnormalities are seen. IMPRESSION: Lungs mildly hypoexpanded. Mild vascular congestion noted. Lungs remain grossly clear. Electronically Signed   By: Roanna Raider M.D.   On: 09/15/2015 04:58   I have personally reviewed and evaluated these images and lab results as part of my medical decision-making.   EKG Interpretation   Date/Time:  Thursday September 15 2015 04:35:23 EST Ventricular Rate:  115 PR Interval:  153 QRS Duration: 108 QT Interval:  347 QTC Calculation: 480 R Axis:   96 Text Interpretation:  Sinus or ectopic atrial tachycardia Inferior  infarct, acute (LCx) Borderline ST elevation, anterior leads Lateral leads  are also involved ** ** ACUTE MI / STEMI ** ** Confirmed by Bebe Shaggy  MD,  Dorinda Hill ([id number]) on 09/15/2015 4:38:04 AM     As soon as I saw the EKG I ordered STEMI workup and asked secretary to page code stemi I spoke to Dr Tresa Endo, STEMI physician at 0444am and informed him of this patient  Cath lab has been activated 5:17 AM Pt with some improved in blood pressure and also his CP He is now going off to cardiac cath lab BP 155/103 mmHg  Pulse 117  Temp(Src) 98.8 F (37.1 C) (Oral)  Resp 20  Ht  (1.753 m)  Wt 102.059 kg  BMI 33.21 kg/m2  SpO2 97%   Medications  0.9 %  sodium chloride infusion (999 mLs Intravenous New Bag/Given 09/15/15 0449)  aspirin chewable tablet 324 mg (324 mg Oral Not Given 09/15/15 0450)  nitroGLYCERIN (NITROSTAT) SL tablet 0.4 mg (0.4 mg Sublingual  Given 09/15/15 0447)  nitroGLYCERIN 50 mg in dextrose 5 % 250 mL (0.2 mg/mL) infusion (10 mcg/min Intravenous New Bag/Given 09/15/15 0512)  nitroGLYCERIN 0.2 mg/mL in dextrose 5 % infusion (not administered)  heparin injection 4,000 Units (4,000 Units Intravenous Given 09/15/15 0447)  metoprolol (LOPRESSOR) injection 5 mg (5 mg Intravenous Given 09/15/15 0509)    MDM   Final diagnoses:  ST elevation myocardial infarction (STEMI), unspecified artery [facility])    Nursing notes including past medical history and social history reviewed and considered in documentation xrays/imaging reviewed by myself and considered during evaluation Labs/vital reviewed myself and considered during evaluation     Zadie Rhine, MD 09/15/15 [phone]

## 2015-09-16 ENCOUNTER — Encounter (HOSPITAL_COMMUNITY): Payer: Self-pay | Admitting: Physician Assistant

## 2015-09-16 DIAGNOSIS — R739 Hyperglycemia, unspecified: Secondary | ICD-10-CM

## 2015-09-16 DIAGNOSIS — E785 Hyperlipidemia, unspecified: Secondary | ICD-10-CM | POA: Diagnosis present

## 2015-09-16 LAB — BASIC METABOLIC PANEL
ANION GAP: 13 (ref 5–15)
BUN: 8 mg/dL (ref 6–20)
CHLORIDE: 104 mmol/L (ref 101–111)
CO2: 20 mmol/L — AB (ref 22–32)
Calcium: 8.7 mg/dL — ABNORMAL LOW (ref 8.9–10.3)
Creatinine, Ser: 0.81 mg/dL (ref 0.61–1.24)
GFR calc non Af Amer: >60 mL/min (ref >60)
GLUCOSE: 133 mg/dL — AB (ref 65–99)
Potassium: 3.9 mmol/L (ref 3.5–5.1)
Sodium: 137 mmol/L (ref 135–145)

## 2015-09-16 LAB — CBC
HEMATOCRIT: 40.8 % (ref 39.0–52.0)
HEMOGLOBIN: 13.7 g/dL (ref 13.0–17.0)
MCH: 27.6 pg (ref 26.0–34.0)
MCHC: 33.6 g/dL (ref 30.0–36.0)
MCV: 82.1 fL (ref 78.0–100.0)
Platelets: 223 10*3/uL (ref 150–400)
RBC: 4.97 MIL/uL (ref 4.22–5.81)
RDW: 13.9 % (ref 11.5–15.5)
WBC: 10.9 10*3/uL — ABNORMAL HIGH (ref 4.0–10.5)

## 2015-09-16 LAB — POCT I-STAT, CHEM 8
BUN: 14 mg/dL (ref 6–20)
CALCIUM ION: 1.16 mmol/L (ref 1.12–1.23)
CREATININE: 0.7 mg/dL (ref 0.61–1.24)
Chloride: 105 mmol/L (ref 101–111)
Glucose, Bld: 166 mg/dL — ABNORMAL HIGH (ref 65–99)
HEMATOCRIT: 47 % (ref 39.0–52.0)
Hemoglobin: 16 g/dL (ref 13.0–17.0)
Potassium: 3.9 mmol/L (ref 3.5–5.1)
Sodium: 139 mmol/L (ref 135–145)
TCO2: 22 mmol/L (ref 0–100)

## 2015-09-16 LAB — LIPID PANEL
CHOL/HDL RATIO: 6.8 ratio
Cholesterol: 216 mg/dL — ABNORMAL HIGH (ref 0–200)
HDL: 32 mg/dL — ABNORMAL LOW (ref >40)
LDL Cholesterol: UNDETERMINED mg/dL (ref 0–99)
Triglycerides: 635 mg/dL — ABNORMAL HIGH (ref <150)
VLDL: UNDETERMINED mg/dL (ref 0–40)

## 2015-09-16 MED ORDER — LISINOPRIL 2.5 MG PO TABS
2.5000 mg | ORAL_TABLET | Freq: Once | ORAL | Status: AC
  Administered 2015-09-16: 2.5 mg via ORAL
  Filled 2015-09-16: qty 1

## 2015-09-16 MED ORDER — METOPROLOL TARTRATE 1 MG/ML IV SOLN
2.5000 mg | Freq: Once | INTRAVENOUS | Status: AC
  Administered 2015-09-16: 2.5 mg via INTRAVENOUS
  Filled 2015-09-16: qty 5

## 2015-09-16 MED ORDER — METOPROLOL TARTRATE 25 MG PO TABS
25.0000 mg | ORAL_TABLET | Freq: Four times a day (QID) | ORAL | Status: DC
  Administered 2015-09-16: 25 mg via ORAL

## 2015-09-16 MED ORDER — LISINOPRIL 5 MG PO TABS
5.0000 mg | ORAL_TABLET | Freq: Every day | ORAL | Status: DC
  Administered 2015-09-17: 5 mg via ORAL
  Filled 2015-09-16: qty 1

## 2015-09-16 MED ORDER — ACETAMINOPHEN 325 MG PO TABS: 650.0000 mg | ORAL_TABLET | Freq: Four times a day (QID) | ORAL | Status: DC | PRN

## 2015-09-16 MED ORDER — METOPROLOL TARTRATE 25 MG PO TABS
25.0000 mg | ORAL_TABLET | Freq: Two times a day (BID) | ORAL | Status: DC
  Administered 2015-09-16: 25 mg via ORAL
  Filled 2015-09-16: qty 1

## 2015-09-16 MED ORDER — METOPROLOL TARTRATE 12.5 MG HALF TABLET
12.5000 mg | ORAL_TABLET | Freq: Four times a day (QID) | ORAL | Status: DC
  Administered 2015-09-16: 12.5 mg via ORAL
  Filled 2015-09-16 (×2): qty 1

## 2015-09-16 NOTE — Discharge Instructions (Signed)
IMPORTANT SAFETY INFORMATION FOR BRILINTA (ticagrelor) TABLETS  WARNING: (A) BLEEDING RISK, (B) ASPIRIN DOSE AND BRILINTA EFFECTIVENESS A. BLEEDING RISK BRILINTA, like other antiplatelet agents, can cause significant, sometimes fatal bleeding  Do not use BRILINTA in patients with active pathological bleeding or a history of intracranial hemorrhage  Do not start BRILINTA in patients undergoing urgent coronary artery bypass graft surgery  If possible, manage bleeding without discontinuing BRILINTA. Stopping BRILINTA increases the risk of subsequent cardiovascular events B. ASPIRIN DOSE AND BRILINTA EFFECTIVENESS Maintenance doses of aspirin above 100 mg reduce the effectiveness of BRILINTA and should be avoided CONTRAINDICATIONS BRILINTA is contraindicated in patients with a history of intracranial hemorrhage or active pathological bleeding such as peptic ulcer or intracranial hemorrhage. BRILINTA is also contraindicated in patients with hypersensitivity (eg, angioedema) to ticagrelor or any component of the product WARNINGS AND PRECAUTIONS Dyspnea was reported in about 14% of patients treated with BRILINTA, more frequently than in patients treated with control agents. Dyspnea resulting from BRILINTA is often self-limiting  Discontinuation of BRILINTA will increase the risk of MI, stroke, and death. When possible, interrupt therapy with BRILINTA for 5 days prior to surgery that has a major risk of bleeding. If BRILINTA must be temporarily discontinued, restart as soon as possible  Ticagrelor can cause ventricular pauses. Bradyarrhythmias including AV block have been reported in the post-marketing setting.  Avoid use of BRILINTA in patients with severe hepatic impairment. Severe hepatic impairment is likely to increase serum concentration of ticagrelor and there are no studies of BRILINTA in these patients ADVERSE REACTIONS The most common adverse reactions associated with the use of BRILINTA included  bleeding and dyspnea (shortness of breath) DRUG INTERACTIONS Avoid use with strong CYP3A inhibitors and strong CYP3A inducers. BRILINTA is metabolized by CYP3A4/5. Strong inhibitors substantially increase ticagrelor exposure and so increase the risk of adverse events. Strong inducers substantially reduce ticagrelor exposure and so decrease the efficacy of ticagrelor. Please check with your doctor or pharmacist prior to taking new or over the counter medications. Patients receiving more than 40 mg per day of simvastatin or lovastatin may be at increased risk of statin-related adverse events  Monitor digoxin levels with initiation of, or change in, BRILINTA therapy  INDICATIONS BRILINTA is indicated to reduce the rate of cardiovascular death, myocardial infarction (MI), and stroke in patients with acute coronary syndrome (ACS) or a history of myocardial infarction. For at least the first 12 months following ACS, it is superior to clopidogrel. BRILINTA also reduces the rate of stent thrombosis in patients who have been stented for treatment of ACS.

## 2015-09-16 NOTE — Progress Notes (Addendum)
Patient Name: Richard Hull Date of Encounter: 09/16/2015  Principal Problem:   ST elevation (STEMI) myocardial infarction involving right coronary artery [facility]) Active Problems:   ST elevation myocardial infarction (STEMI) of inferior wall, initial episode of care (HCC)   Hypertension, malignant   Hyperglycemia   Hyperlipidemia LDL goal <70   Primary Cardiologist: New  Patient Profile: 40 yo male w/ hx HTN, hypertriglycerides and nephrolithiasis was admitted 01/26 w/ STEMI, ruptured plaque in RCA w/ distal embolization, s/p DES PDA.  SUBJECTIVE: Chest pain greatly improved, no SOB, no palpitations.   OBJECTIVE Filed Vitals:   09/16/15 0500 09/16/15 0530 09/16/15 0600 09/16/15 0630  BP: 135/86 124/86 137/86 131/83  Pulse: 100 98 94 97  Temp:      TempSrc:      Resp: 26 27 25 19   Height:      Weight:      SpO2: 96% 96% 96% 97%    Intake/Output Summary (Last 24 hours) at 09/16/15 0726 Last data filed at 09/16/15 0600  Gross per 24 hour  Intake 2988.09 ml  Output   2775 ml  Net 213.09 ml   Filed Weights   09/15/15 0440  Weight: 225 lb (102.059 kg)    PHYSICAL EXAM General: Well developed, well nourished, male in no acute distress. Head: Normocephalic, atraumatic.  Neck: Supple without bruits, JVD not elevated. Lungs:  Resp regular and unlabored, CTA. Heart: RRR, S1, S2, no S3, S4, or murmur; no rub. Abdomen: Soft, non-tender, non-distended, BS + x 4.  Extremities: No clubbing, cyanosis, edema. R radial cath site without ecchymosis or hematoma Neuro: Alert and oriented X 3. Moves all extremities spontaneously. Psych: Normal affect.  LABS: CBC:  Recent Labs  09/15/15 0443 09/16/15 0335  WBC 14.1* 10.9*  HGB 16.3 13.7  HCT 48.6 40.8  MCV 81.8 82.1  PLT 249 223   INR:  Recent Labs  09/15/15 0443  INR 0.97   Basic Metabolic Panel:  Recent Labs  13/14/38 0443 09/16/15 0335  NA 140 137  K 4.5 3.9  CL 101 104  CO2 25 20*  GLUCOSE 144*  133*  BUN 13 8  CREATININE 0.84 0.81  CALCIUM 9.3 8.7*   Liver Function Tests:  Recent Labs  09/15/15 0443  AST 36  ALT 54  ALKPHOS 66  BILITOT 0.8  PROT 6.4*  ALBUMIN 4.0   Cardiac Enzymes:  Recent Labs  09/15/15 0855  TROPONINI 16.70*    Recent Labs  09/15/15 0443  TROPIPOC 0.03   Drugs of Abuse     Component Value Date/Time   LABOPIA NONE DETECTED 09/15/2015 1155   COCAINSCRNUR NONE DETECTED 09/15/2015 1155   LABBENZ POSITIVE* 09/15/2015 1155   AMPHETMU NONE DETECTED 09/15/2015 1155   THCU NONE DETECTED 09/15/2015 1155   LABBARB NONE DETECTED 09/15/2015 1155    TELE:   SR, ST w/ 4 bt run NSVT     ECG: 09/16/2015 ST, rate 101 Inferior Q waves  CATH: 09/15/2015  Mid LAD to Dist LAD lesion, 30% stenosed.  Ost RCA to Prox RCA lesion, 30% stenosed.  Dist RCA lesion, 30% stenosed. Post intervention, there is a 0% residual stenosis.  RPDA lesion, 99% stenosed. Post intervention, there is a 5% residual stenosis.  The left ventricular systolic function is normal. Acute inferior ST segment elevation myocardial infarction secondary to an ulcerated plaque in the distal RCA with evidence for distal embolization to the mid PDA vessel. Coronary anatomy demonstrating a normal left main  coronary artery, large LAD vessel with 30% smooth mid narrowing after the second diagonal branch; normal ramus intermediate vessel; normal left circumflex coronary artery; and large dominant RCA with mild 30% narrowing, an ulcerated plaque beyond the acute margin before the PDA takeoff and 99% distal PDA stenosis. Successful acute coronary intervention to the RCA with PTCA of the PDA vessel with a 99% stenosis being reduced to 5%, and primary stenting of the ulcerated plaque being reduced to 0% with insertion of a 3.515 mm Xience Alpine DES stent postdilated to 3.71 mm. RECOMMENDATION: The patient will be on dual antiplatelet therapy for minimum of 1 year. He will need to be treated  with aggressive lipid lowering therapy as well as blood pressure control with beta blocker and ACE-I.   Radiology/Studies: Dg Chest Portable 1 View 09/15/2015  CLINICAL DATA:  Acute onset of mid chest pain.  Initial encounter. EXAM: PORTABLE CHEST 1 VIEW COMPARISON:  None. FINDINGS: The lungs are mildly hypoexpanded. Mild vascular crowding and vascular congestion are noted. There is no evidence of focal opacification, pleural effusion or pneumothorax. The cardiomediastinal silhouette is within normal limits. No acute osseous abnormalities are seen. IMPRESSION: Lungs mildly hypoexpanded. Mild vascular congestion noted. Lungs remain grossly clear. Electronically Signed   By: Roanna Raider M.D.   On: 09/15/2015 04:58     Current Medications:  . aspirin EC  81 mg Oral Daily  . atorvastatin  80 mg Oral q1800  . lisinopril  2.5 mg Oral Daily  . metoprolol tartrate  12.5 mg Oral 4 times per day  . sodium chloride flush  3 mL Intravenous Q12H  . ticagrelor  90 mg Oral BID   . sodium chloride 999 mL (09/15/15 0449)  . sodium chloride Stopped (09/16/15 0500)  . nitroGLYCERIN 36 mcg/min (09/15/15 2304)    ASSESSMENT AND PLAN: Principal Problem:   ST elevation (STEMI) myocardial infarction involving right coronary artery (HCC) - Distal RCA plaque rupture w/ embolization to the PDA, s/p DES - minimal other dz, med rx - on ASA, BB, high-dose statin, Brilinta - had recurrent chest pain last pm, so is on IV NTG, wean and d/c today - cardiac rehab to see  Active Problems:   ST elevation myocardial infarction (STEMI) of inferior wall, initial episode of care (HCC) - see above    Hypertension, malignant - all rx is new - increase metoprolol    Hyperglycemia -  A1c pending    Hyperlipidemia LDL goal <70 - hx elevated triglycerides - needs heart-healthy diet - on high-dose statin - profile pending  Signed, Leanna Battles 7:26 AM 09/16/2015  The patient was seen, examined and  discussed with Theodore Demark, PA-C and I agree with the above.   40 yo male w/ hx HTN, hypertriglycerides and nephrolithiasis was admitted 01/26 w/ STEMI, ruptured plaque in RCA w/ distal embolization, s/p DES PDA. Doing well, chest pain improved, but still ongoing, also chest pain, and overall muscle pain, on NTG drip,we will try to wean off, increase metoprolol to 25 mg po BID for tachycardia and reperfusion nsVTs on telemetry. Continue ASA, Brilinta, atorvastatin, metoprolol, lisinopril. We will keep in CCU for another day.  Lars Masson 09/16/2015

## 2015-09-16 NOTE — Progress Notes (Signed)
CARDIAC REHAB PHASE I   PRE:  Rate/Rhythm: 99 SR    BP: sitting 140/96    SaO2:   MODE:  Ambulation: 350 ft   POST:  Rate/Rhythm: 110 ST    BP: sitting 157/110     SaO2:   Pt c/o sensation in chest, hands and feet when he moves. However he actually felt better getting up and walking. BP very elevated. Ed completed with pt and wife. He generally prefers holistic approach to meds. He was a vegan for 4 years trying to control his triglycerides and cholesterol. It was hard on his family though. Encouraged pt to have good communication with his MD and be serious about meds. Understands importance of Brilinta. Will send referral to [facility]. Pt would eventually like to get back to running.  [phone]   Elissa Lovett Hamlin CES, ACSM 09/16/2015 12:37 PM

## 2015-09-16 NOTE — Plan of Care (Signed)
Problem: Phase I Progression Outcomes Goal: Pain controlled with appropriate interventions Outcome: Progressing Tylenol for HA. Morphine for chest pain. Repositioning non-pharmacological.  Goal: Hemodynamically stable Outcome: Progressing Maintaining SBP < 140 and HR < 110 with meds.

## 2015-09-17 LAB — HEMOGLOBIN A1C
HEMOGLOBIN A1C: 6.2 % — AB (ref 4.8–5.6)
Mean Plasma Glucose: 131 mg/dL

## 2015-09-17 LAB — TSH: TSH: 5.921 u[IU]/mL — ABNORMAL HIGH (ref 0.350–4.500)

## 2015-09-17 MED ORDER — METOPROLOL TARTRATE 50 MG PO TABS
50.0000 mg | ORAL_TABLET | Freq: Two times a day (BID) | ORAL | Status: DC
  Administered 2015-09-17 – 2015-09-18 (×3): 50 mg via ORAL
  Filled 2015-09-17 (×3): qty 1

## 2015-09-17 NOTE — Progress Notes (Signed)
       Patient Name: Richard Hull Date of Encounter: 09/17/2015    SUBJECTIVE: Acute inferior myocardial infarction treated with intervention that included distal RCA stent and PDA angioplasty. Significant underlying history of hypertension. Also known hypertriglyceridemia. Patient does not smoke but has a significant family history of coronary disease. He denies residual chest discomfort and has not had dyspnea. He has no complaints concerning access sites.  TELEMETRY:  Normal sinus rhythm without significant arrhythmia Filed Vitals:   09/17/15 0500 09/17/15 0600 09/17/15 0700 09/17/15 0739  BP: 163/117 154/115 169/114   Pulse:      Temp:    98.9 F (37.2 C)  TempSrc:    Oral  Resp:      Height:      Weight:      SpO2:        Intake/Output Summary (Last 24 hours) at 09/17/15 0803 Last data filed at 09/17/15 0500  Gross per 24 hour  Intake 1084.75 ml  Output   3150 ml  Net -2065.25 ml   LABS: Basic Metabolic Panel:  Recent Labs  63/89/37 0443 09/15/15 0544 09/16/15 0335  NA 140 139 137  K 4.5 3.9 3.9  CL 101 105 104  CO2 25  --  20*  GLUCOSE 144* 166* 133*  BUN 13 14 8   CREATININE 0.84 0.70 0.81  CALCIUM 9.3  --  8.7*   CBC:  Recent Labs  09/15/15 0443 09/15/15 0544 09/16/15 0335  WBC 14.1*  --  10.9*  HGB 16.3 16.0 13.7  HCT 48.6 47.0 40.8  MCV 81.8  --  82.1  PLT 249  --  223   Cardiac Enzymes:  Recent Labs  09/15/15 0855  TROPONINI 16.70*   BNP: Invalid input(s): POCBNP Hemoglobin A1C:  Recent Labs  09/16/15 0335  HGBA1C 6.2*   Fasting Lipid Panel:  Recent Labs  09/16/15 0335  CHOL 216*  HDL 32*  LDLCALC UNABLE TO CALCULATE IF TRIGLYCERIDE OVER 400 mg/dL  TRIG 342*  CHOLHDL 6.8    Radiology/Studies:  Chest x-ray on admission revealed mild vascular congestion.  Physical Exam: Blood pressure 169/114, pulse 99, temperature 98.9 F (37.2 C), temperature source Oral, resp. rate 20, height 5\' 9"  (1.753 m), weight 225 lb  (102.059 kg), SpO2 97 %. Weight change:   Wt Readings from Last 3 Encounters:  09/15/15 225 lb (102.059 kg)  03/08/14 210 lb (95.255 kg)   S4 gallop on cardiac exam Lungs are clear to auscultation and percussion Extremities reveal no edema. Right radial access site is unremarkable HEENT exam is unremarkable Neurological exam is normal  ASSESSMENT:  1. Low risk acute inferior wall myocardial infarction, with excellent postintervention result. 2. Normal left ventricular systolic function 3. Hypertension not well controlled  Plan:  1. Echocardiogram to assess for left ventricular hypertrophy 2. Aggressive blood pressure control 3. Dual antiplatelet therapy 4. Aggressive antilipid therapy 5. Probable discharge in a.m. 6. Cardiac rehabilitation  Signed, Lesleigh Noe 09/17/2015, 8:03 AM

## 2015-09-17 NOTE — Plan of Care (Signed)
Problem: Phase I Progression Outcomes Goal: Hemodynamically stable Outcome: Progressing Continuing to monitor elevated BP.

## 2015-09-18 ENCOUNTER — Inpatient Hospital Stay (HOSPITAL_COMMUNITY): Payer: BLUE CROSS/BLUE SHIELD

## 2015-09-18 DIAGNOSIS — R7989 Other specified abnormal findings of blood chemistry: Secondary | ICD-10-CM | POA: Diagnosis present

## 2015-09-18 DIAGNOSIS — Z955 Presence of coronary angioplasty implant and graft: Secondary | ICD-10-CM | POA: Insufficient documentation

## 2015-09-18 DIAGNOSIS — I425 Other restrictive cardiomyopathy: Secondary | ICD-10-CM

## 2015-09-18 LAB — T4, FREE: Free T4: 0.91 ng/dL (ref 0.61–1.12)

## 2015-09-18 MED ORDER — LISINOPRIL 20 MG PO TABS
20.0000 mg | ORAL_TABLET | Freq: Every day | ORAL | Status: DC
  Administered 2015-09-18: 20 mg via ORAL
  Filled 2015-09-18: qty 1

## 2015-09-18 MED ORDER — TICAGRELOR 90 MG PO TABS: 90.0000 mg | ORAL_TABLET | Freq: Two times a day (BID) | ORAL | Status: DC

## 2015-09-18 MED ORDER — METOPROLOL TARTRATE 50 MG PO TABS: 50.0000 mg | ORAL_TABLET | Freq: Two times a day (BID) | ORAL | Status: DC

## 2015-09-18 MED ORDER — LISINOPRIL 20 MG PO TABS: 20.0000 mg | ORAL_TABLET | Freq: Every day | ORAL | Status: DC

## 2015-09-18 MED ORDER — ASPIRIN 81 MG PO TBEC: 81.0000 mg | DELAYED_RELEASE_TABLET | Freq: Every day | ORAL | Status: AC

## 2015-09-18 MED ORDER — NITROGLYCERIN 0.4 MG SL SUBL: 0.4000 mg | SUBLINGUAL_TABLET | SUBLINGUAL | Status: DC | PRN

## 2015-09-18 MED ORDER — ATORVASTATIN CALCIUM 80 MG PO TABS: 80.0000 mg | ORAL_TABLET | Freq: Every day | ORAL | Status: DC

## 2015-09-18 MED ORDER — TICAGRELOR 90 MG PO TABS
90.0000 mg | ORAL_TABLET | Freq: Two times a day (BID) | ORAL | Status: DC
  Administered 2015-09-18: 90 mg via ORAL
  Filled 2015-09-18: qty 1

## 2015-09-18 NOTE — Progress Notes (Signed)
Echocardiogram 2D Echocardiogram has been performed.  Richard Hull 09/18/2015, 10:16 AM

## 2015-09-18 NOTE — Progress Notes (Signed)
    Subjective: Some chest discomfort from coughing.   Objective: Vital signs in last 24 hours: Temp:  [97.9 F (36.6 C)-98.9 F (37.2 C)] 97.9 F (36.6 C) (01/29 0412) Pulse Rate:  [83-101] 83 (01/29 0412) Resp:  [16-20] 19 (01/29 0412) BP: (135-176)/(79-111) 136/79 mmHg (01/29 0412) SpO2:  [96 %-99 %] 98 % (01/29 0412) Weight:  [229 lb 15 oz (104.3 kg)-230 lb 6.4 oz (104.509 kg)] 229 lb 15 oz (104.3 kg) (01/29 0412) Last BM Date: 09/16/15  Intake/Output from previous day: 01/28 0701 - 01/29 0700 In: 180 [P.O.:180] Out: -  Intake/Output this shift:    Medications Scheduled Meds: . aspirin EC  81 mg Oral Daily  . atorvastatin  80 mg Oral q1800  . lisinopril  5 mg Oral Daily  . metoprolol tartrate  50 mg Oral BID   Continuous Infusions:  PRN Meds:.acetaminophen, nitroGLYCERIN, ondansetron (ZOFRAN) IV  PE: General appearance: alert, cooperative and no distress Lungs: clear to auscultation bilaterally Heart: regular rate and rhythm, S1, S2 normal, no murmur, click, rub or gallop Extremities: No LEE Pulses: 2+ and symmetric Skin: Warm and dry Neurologic: Grossly normal  Lab Results:   Recent Labs  09/16/15 0335  WBC 10.9*  HGB 13.7  HCT 40.8  PLT 223   BMET  Recent Labs  09/16/15 0335  NA 137  K 3.9  CL 104  CO2 20*  GLUCOSE 133*  BUN 8  CREATININE 0.81  CALCIUM 8.7*   PT/INR No results for input(s): LABPROT, INR in the last 72 hours. Cholesterol  Recent Labs  09/16/15 0335  CHOL 216*   Lipid Panel     Component Value Date/Time   CHOL 216* 09/16/2015 0335   TRIG 635* 09/16/2015 0335   HDL 32* 09/16/2015 0335   CHOLHDL 6.8 09/16/2015 0335   VLDL UNABLE TO CALCULATE IF TRIGLYCERIDE OVER 400 mg/dL 45/62/5638 9373   LDLCALC UNABLE TO CALCULATE IF TRIGLYCERIDE OVER 400 mg/dL 42/87/6811 5726     Cardiac Panel (last 3 results)  Recent Labs  09/15/15 0855  TROPONINI 16.70*     Assessment/Plan  Principal Problem:   ST elevation  (STEMI) myocardial infarction involving right coronary artery (HCC)   Hypertension, malignant   Hyperglycemia   Hyperlipidemia LDL goal <70   Elevated TSH  SP PCI to the distal RCA and angioplasty to the RPDA.  ASA, brilinta,  lipitor 80, lisinopril 5 and metoprolol 50 bid.  BP still elevated.  Increase lisinopril to 20mg .  Consider renal artery dopplers and other secondary causes for HTN.  Check Free T3/T4 as TSH is elevated.  Triglycerides 635.  Ambulate with Cardiac Rehab this morning. Prediabetes based on A1C(6.2) dietary modifications discussed.  Echo today.  DC to home after echo.    LOS: 3 days    Reida Hem PA-C 09/18/2015 7:26 AM

## 2015-09-18 NOTE — Discharge Summary (Signed)
Discharge Summary    Patient ID: Richard Hull,  MRN: [id number], DOB/AGE: 39-Jul-1977 39 y.o.  Admit date: 09/15/2015 Discharge date: 09/21/2015  Primary Care Provider: Gwen Pounds Primary Cardiologist: Tresa Endo  Discharge Diagnoses    Principal Problem:   ST elevation (STEMI) myocardial infarction involving right coronary artery [facility]) Active Problems:   Hypertension, malignant   Hyperglycemia   Hyperlipidemia LDL goal <70   Stented coronary artery   Elevated TSH    Allergies Allergies  Allergen Reactions  . Amoxicillin Rash  . Penicillins Rash    Diagnostic Studies/Procedures    Procedures    Coronary Stent Intervention   Left Heart Cath and Coronary Angiography    Conclusion     Mid LAD to Dist LAD lesion, 30% stenosed.  Ost RCA to Prox RCA lesion, 30% stenosed.  Dist RCA lesion, 30% stenosed. Post intervention, there is a 0% residual stenosis.  RPDA lesion, 99% stenosed. Post intervention, there is a 5% residual stenosis.  The left ventricular systolic function is normal.  Acute inferior ST segment elevation myocardial infarction secondary to an ulcerated plaque in the distal RCA with evidence for distal embolization to the mid PDA vessel.  Coronary anatomy demonstrating a normal left main coronary artery, large LAD vessel with 30% smooth mid narrowing after the second diagonal branch; normal ramus intermediate vessel; normal left circumflex coronary artery; and large dominant RCA with mild 30% narrowing, an ulcerated plaque beyond the acute margin before the PDA takeoff and 99% distal PDA stenosis.  Successful acute coronary intervention to the RCA with PTCA of the PDA vessel with a 99% stenosis being reduced to 5%, and primary stenting of the ulcerated plaque being reduced to 0% with insertion of a 3.515 mm Xience Alpine DES stent postdilated to 3.71 mm.  RECOMMENDATION: The patient will be on dual antiplatelet therapy for minimum of 1 year. He  will need to be treated with aggressive lipid lowering therapy as well as blood pressure control with beta blocker and ACE-I.    Diagnostic Diagram                                              Post-Intervention Diagram                   Echocardiogram Study Conclusions  - Left ventricle: The cavity size was normal. There was severe focal basal and moderate concentric hypertrophy of the let ventricle. Systolic function was vigorous. The estimated ejection fraction was in the range of 65% to 70%. Wall motion was normal; there were no regional wall motion abnormalities. Doppler parameters are consistent with abnormal left ventricular relaxation (grade 1 diastolic dysfunction). Doppler parameters are consistent with elevated ventricular end-diastolic filling pressure. - Aortic valve: There was no regurgitation. - Aortic root: The aortic root was normal in size. - Mitral valve: There was no regurgitation. - Left atrium: The atrium was normal in size. - Right ventricle: Systolic function was normal. - Right atrium: The atrium was normal in size. - Tricuspid valve: There was trivial regurgitation. - Pulmonic valve: There was no regurgitation. - Pulmonary arteries: Systolic pressure was within the normal range. - Inferior vena cava: The vessel was normal in size. - Pericardium, extracardiac: There was no pericardial effusion.  Impressions:  - The study more consistent with hypertensive heart disease rather than hypertrophic cardiomyopathy.  _____________   History of Present Illness     40 y/o male with h/o obesity, HTN, hypertriglyceridemia and kidney stones. No known h/o CAD.   Woke up at approximately 345a with severe chest pain radiating to his back and both arms. No N/V or HF symptoms. Presented to [facility] ER and ECG c/w acute inferior STEMI. On arrival, SBP 210/115. Treated with ASA, heparin, NTG. POC troponin 0.03.  Hospital Course       Consultants: Cardiac rehab   Patient was admitted and taken directly to the Cath Lab. It should with aspirin, heparin and nitroglycerin in the emergency room. He was started on IV beta blocker in addition to nitroglycerin for severe hypertension.  He was also started high-dose statin, ACE inhibitor, which was subsequently increased to 20 mg. He was transitioned to by mouth lopressor, which was later increased to 50 mg twice daily.  His left heart catheterization revealed Mid LAD to Dist LAD lesion, 30% stenosed. Ost RCA to Prox RCA lesion, 30% stenosed. Dist RCA lesion, 30% stenosed. He underwent stent placement of the distal ulcerated plaque in the RCA.   Post intervention, there is a 0% residual stenosis.  RPDA lesion, 99% stenosed. Post angioplasty, there was a 5% residual stenosis.  The left ventricular systolic function is normal.  He was seen by cardiac rehabilitation. He did have an echocardiogram to assess the degree of LV hypertrophy.  It revealed ejection fraction of 65-70% with normal wall motion. Severe focal basal and moderate concentric hypertrophy of the LV. Grade 1 diastolic dysfunction. Trivial TR.  He did miss the PM dose of brilinta on 1/28 which was given at 0817hrs the following day.  Dietary modifications were discussed.  The patient reports he was vegan for about five years, but in the last few months was not adhering to the diet.  Patient's TSH was mildly elevated.  We checked a free T3 and T4. The patient was seen by Dr. Katrinka Blazing who felt he was stable for DC home.  Outpatient needs:  LFTs and lipids in 6 weeks  _____________  Discharge Vitals Blood pressure 162/101, pulse 98, temperature 97.9 F (36.6 C), temperature source Oral, resp. rate 19, height 5\' 9"  (1.753 m), weight 229 lb 15 oz (104.3 kg), SpO2 98 %.  Filed Weights   09/15/15 0440 09/17/15 0948 09/18/15 0412  Weight: 225 lb (102.059 kg) 230 lb 6.4 oz (104.509 kg) 229 lb 15 oz (104.3 kg)    Labs & Radiologic Studies       Dg Chest Portable 1 View  09/15/2015  CLINICAL DATA:  Acute onset of mid chest pain.  Initial encounter. EXAM: PORTABLE CHEST 1 VIEW COMPARISON:  None. FINDINGS: The lungs are mildly hypoexpanded. Mild vascular crowding and vascular congestion are noted. There is no evidence of focal opacification, pleural effusion or pneumothorax. The cardiomediastinal silhouette is within normal limits. No acute osseous abnormalities are seen. IMPRESSION: Lungs mildly hypoexpanded. Mild vascular congestion noted. Lungs remain grossly clear. Electronically Signed   By: Roanna Raider M.D.   On: 09/15/2015 04:58    Disposition   Pt is being discharged home today in good condition.  Follow-up Plans & Appointments    Follow-up Information    Follow up with Lennette Bihari, MD.   Specialty:  Cardiology   Why:  The office scheduler will call you with the appt date and time.    Contact information:   [address] [place] [address] [phone]  Discharge Instructions    AMB Referral to Cardiac Rehabilitation - Phase II    Complete by:  As directed   Diagnosis:  Myocardial Infarction     Amb Referral to Cardiac Rehabilitation    Complete by:  As directed   Diagnosis:   Myocardial Infarction PCI       Diet - low sodium heart healthy    Complete by:  As directed      Increase activity slowly    Complete by:  As directed            Discharge Medications   Discharge Medication List as of 09/18/2015 12:23 PM    START taking these medications   Details  aspirin EC 81 MG EC tablet Take 1 tablet (81 mg total) by mouth daily., Starting 09/18/2015, Until Discontinued, No Print    atorvastatin (LIPITOR) 80 MG tablet Take 1 tablet (80 mg total) by mouth daily at 6 PM., Starting 09/18/2015, Until Discontinued, Normal    lisinopril (PRINIVIL,ZESTRIL) 20 MG tablet Take 1 tablet (20 mg total) by mouth daily., Starting 09/18/2015, Until Discontinued, Normal    metoprolol  (LOPRESSOR) 50 MG tablet Take 1 tablet (50 mg total) by mouth 2 (two) times daily., Starting 09/18/2015, Until Discontinued, Normal    nitroGLYCERIN (NITROSTAT) 0.4 MG SL tablet Place 1 tablet (0.4 mg total) under the tongue every 5 (five) minutes as needed for chest pain (CP or SOB)., Starting 09/18/2015, Until Discontinued, Normal      CONTINUE these medications which have CHANGED   Details  ticagrelor (BRILINTA) 90 MG TABS tablet Take 1 tablet (90 mg total) by mouth 2 (two) times daily., Starting 09/18/2015, Until Discontinued, Normal      STOP taking these medications     ondansetron (ZOFRAN) 4 MG tablet      oxyCODONE-acetaminophen (PERCOCET/ROXICET) 5-325 MG per tablet          Aspirin prescribed at discharge?  Yes High Intensity Statin Prescribed? (Lipitor 40-80mg  or Crestor 20-40mg ): Yes Beta Blocker Prescribed? Yes For EF 45% or less, Was ACEI/ARB Prescribed? No: EF normal ADP Receptor Inhibitor Prescribed? (i.e. Plavix etc.-Includes Medically Managed Patients): Yes For EF <40%, Aldosterone Inhibitor Prescribed? No:  Was EF assessed during THIS hospitalization? Yes Was Cardiac Rehab II ordered? (Included Medically managed Patients): Yes   Outstanding Labs/Studies   Free T3, free T4  Duration of Discharge Encounter   Greater than 30 minutes including physician time.  Risa Grill, Zuha Dejonge PAC 09/21/2015, 1:45 PM

## 2015-09-19 LAB — T3, FREE: T3, Free: 2.4 pg/mL (ref 2.0–4.4)

## 2015-09-20 ENCOUNTER — Telehealth: Payer: Self-pay | Admitting: Physician Assistant

## 2015-09-20 MED FILL — Bivalirudin For IV Soln 250 MG: INTRAVENOUS | Qty: 250 | Status: AC

## 2015-09-20 MED FILL — Sodium Chloride IV Soln 0.9%: INTRAVENOUS | Qty: 50 | Status: AC

## 2015-09-20 NOTE — Telephone Encounter (Signed)
New problem    Pt has 7-10 TCM w/Rhonda on 2.7.17.per staff message.

## 2015-09-20 NOTE — Telephone Encounter (Signed)
Patient contacted regarding discharge from  [facility] on January 29,2017.   Patient understands to follow up with provider  Yes. Noralyn Pick, PA on 09/27/15 at 9:00 at [facility] office.  Patient understands discharge instructions?  yes Patient understands medications and regiment?  yes Patient understands to bring all medications to this visit?  yes

## 2015-09-27 ENCOUNTER — Encounter: Payer: Self-pay | Admitting: Physician Assistant

## 2015-09-27 ENCOUNTER — Ambulatory Visit (INDEPENDENT_AMBULATORY_CARE_PROVIDER_SITE_OTHER): Payer: BLUE CROSS/BLUE SHIELD | Admitting: Physician Assistant

## 2015-09-27 ENCOUNTER — Other Ambulatory Visit: Payer: Self-pay | Admitting: Internal Medicine

## 2015-09-27 VITALS — BP 130/90 | HR 80 | Ht 69.0 in | Wt 231.0 lb

## 2015-09-27 DIAGNOSIS — R29898 Other symptoms and signs involving the musculoskeletal system: Secondary | ICD-10-CM

## 2015-09-27 DIAGNOSIS — I10 Essential (primary) hypertension: Secondary | ICD-10-CM

## 2015-09-27 DIAGNOSIS — I159 Secondary hypertension, unspecified: Secondary | ICD-10-CM | POA: Diagnosis not present

## 2015-09-27 DIAGNOSIS — E785 Hyperlipidemia, unspecified: Secondary | ICD-10-CM

## 2015-09-27 DIAGNOSIS — I2119 ST elevation (STEMI) myocardial infarction involving other coronary artery of inferior wall: Secondary | ICD-10-CM

## 2015-09-27 NOTE — Patient Instructions (Signed)
Medication Instructions:  Your physician recommends that you continue on your current medications as directed. Please refer to the Current Medication list given to you today.   Labwork: Your physician recommends that you return for lab work in: Before next appt FASTING (lipid/liver) The lab can be found on the FIRST FLOOR of out building in [address]   Testing/Procedures: none  Follow-Up: Your physician recommends that you schedule a follow-up appointment in: 3 months with Dr. Tresa Endo   Any Other Special Instructions Will Be Listed Below (If Applicable).     If you need a refill on your cardiac medications before your next appointment, please call your pharmacy.

## 2015-09-27 NOTE — Progress Notes (Signed)
Cardiology Office Note   Date:  09/27/2015   ID:  Richard Hull, DOB [birth year], MRN [id number]  PCP:  Gwen Pounds, MD  Cardiologist:  Dr Venora Maples, PA-C   History of Present Illness: Richard Hull is a 40 y.o. male with a history of STEMI w/ DES RCA (d/c 01/29), HTN, HLD w/ elev trig, elev TSH (mild)  Richard Hull presents for posthospital follow-up.  Since discharge from the hospital, Mr. Richard Hull has improved. For the first few days, all he did was sleep. He never felt rested. That improved and now he feels pretty normal. He does not feel that his activity level is back up to where it was, but he is doing better. He is tolerating his medications well. He has not had chest pain. He has not had shortness of breath. He is not lightheaded or dizzy. He is trying to be compliant with low cholesterol diet. He was no fecal increase his activity. He wants to know when he can return to work. He was to know when he can drive.  He has an issue that involves his left arm (cath was done from the right radial). His left arm has been weak and movement is decreased as well since the cath. When he tries to lift over 10 pounds, he just cannot do it. He has seen his primary care physician for this, and MRI is planned to rule out embolic cause.  He has also had a dry cough. He was advised by his primary care physician that this could be secondary to the lisinopril. It is not bothering him very much right now and he will follow-up with Dr. Kortland Lasso if he feels he needs to change that medication.   Past Medical History  Diagnosis Date  . Hypertension   . Coronary artery disease 08/2015  . Myocardial infarction (HCC) 08/2015  . Hyperlipidemia LDL goal <70   . Hypertriglyceridemia     Past Surgical History  Procedure Laterality Date  . Tonsillectomy    . Cardiac catheterization N/A 09/15/2015    Procedure: Left Heart Cath and Coronary Angiography;  Surgeon: Lennette Bihari, MD; LAD 30%,  pRCA 30%, dRCA 30%, RPDA 99%, nl LV function   . Cardiac catheterization N/A 09/15/2015    Procedure: Coronary Stent Intervention;  Surgeon: Lennette Bihari, MD; 3.515 mm Xience Alpine DES to dRCA/RPDA>> 5% residual       Current Outpatient Prescriptions  Medication Sig Dispense Refill  . aspirin EC 81 MG EC tablet Take 1 tablet (81 mg total) by mouth daily.    Marland Kitchen atorvastatin (LIPITOR) 80 MG tablet Take 1 tablet (80 mg total) by mouth daily at 6 PM. 30 tablet 11  . lisinopril (PRINIVIL,ZESTRIL) 20 MG tablet Take 1 tablet (20 mg total) by mouth daily. 30 tablet 11  . metoprolol (LOPRESSOR) 50 MG tablet Take 1 tablet (50 mg total) by mouth 2 (two) times daily. 60 tablet 11  . nitroGLYCERIN (NITROSTAT) 0.4 MG SL tablet Place 1 tablet (0.4 mg total) under the tongue every 5 (five) minutes as needed for chest pain (CP or SOB). 25 tablet 12  . Omega-3 Fatty Acids (FISH OIL) 1000 MG CAPS Take 1,000 mg by mouth 2 (two) times daily.    . Saw Palmetto, Serenoa repens, (SAW PALMETTO PO) Take 150 mg by mouth daily.    . ticagrelor (BRILINTA) 90 MG TABS tablet Take 1 tablet (90 mg total) by mouth 2 (two) times daily. 60  tablet 11  . vitamin C (ASCORBIC ACID) 500 MG tablet Take 500 mg by mouth 2 (two) times daily.     No current facility-administered medications for this visit.    Allergies:   Amoxicillin and Penicillins    Social History:  The patient  reports that he has never smoked. He does not have any smokeless tobacco history on file. He reports that he does not drink alcohol or use illicit drugs.   Family History:  The patient's family history includes Heart attack (age of onset: 41) in his father; Hypertension in his father, maternal grandfather, maternal grandmother, paternal grandfather, and paternal grandmother; Stroke in his father and paternal grandmother.    ROS:  Please see the history of present illness. All other systems are reviewed and negative.    PHYSICAL EXAM: VS:  BP 130/90  mmHg  Pulse 80  Ht 5\' 9"  (1.753 m)  Wt 231 lb (104.781 kg)  BMI 34.10 kg/m2 , BMI Body mass index is 34.1 kg/(m^2). GEN: Well nourished, well developed, male in no acute distress HEENT: normal for age  Neck: no JVD, no carotid bruit, no masses Cardiac: RRR; no murmur, no rubs, or gallops Respiratory:  clear to auscultation bilaterally, normal work of breathing GI: soft, nontender, nondistended, + BS MS: no deformity or atrophy; no edema; distal pulses are 2+ in all 4 extremities  Skin: warm and dry, no rash Neuro:  Strength and sensation are intact, except in the left arm which has weakness. Psych: euthymic mood, full affect   EKG:  EKG is ordered today. The ekg ordered today demonstrates sinus rhythm, inferior Q waves are noted Inferior T waves are inverted, consistent with recent MI  ECHO: 09/18/2015 - Left ventricle: The cavity size was normal. There was severe focal basal and moderate concentric hypertrophy of the let ventricle. Systolic function was vigorous. The estimated ejection fraction was in the range of 65% to 70%. Wall motion was normal; there were no regional wall motion abnormalities. Doppler parameters are consistent with abnormal left ventricular relaxation (grade 1 diastolic dysfunction). Doppler parameters are consistent with elevated ventricular end-diastolic filling pressure. - Aortic valve: There was no regurgitation. - Aortic root: The aortic root was normal in size. - Mitral valve: There was no regurgitation. - Left atrium: The atrium was normal in size. - Right ventricle: Systolic function was normal. - Right atrium: The atrium was normal in size. - Tricuspid valve: There was trivial regurgitation. - Pulmonic valve: There was no regurgitation. - Pulmonary arteries: Systolic pressure was within the normal range. - Inferior vena cava: The vessel was normal in size. - Pericardium, extracardiac: There was no pericardial  effusion. Impressions: - The study more consistent with hypertensive heart disease rather than hypertrophic cardiomyopathy.  CATH: 09/15/2015  Mid LAD to Dist LAD lesion, 30% stenosed.  Ost RCA to Prox RCA lesion, 30% stenosed.  Dist RCA lesion, 30% stenosed. Post intervention, there is a 0% residual stenosis.  RPDA lesion, 99% stenosed. Post intervention, there is a 5% residual stenosis.  The left ventricular systolic function is normal. Acute inferior ST segment elevation myocardial infarction secondary to an ulcerated plaque in the distal RCA with evidence for distal embolization to the mid PDA vessel. Coronary anatomy demonstrating a normal left main coronary artery, large LAD vessel with 30% smooth mid narrowing after the second diagonal branch; normal ramus intermediate vessel; normal left circumflex coronary artery; and large dominant RCA with mild 30% narrowing, an ulcerated plaque beyond the acute margin before  the PDA takeoff and 99% distal PDA stenosis. Successful acute coronary intervention to the RCA with PTCA of the PDA vessel with a 99% stenosis being reduced to 5%, and primary stenting of the ulcerated plaque being reduced to 0% with insertion of a 3.515 mm Xience Alpine DES stent postdilated to 3.71 mm. RECOMMENDATION: The patient will be on dual antiplatelet therapy for minimum of 1 year. He will need to be treated with aggressive lipid lowering therapy as well as blood pressure control with beta blocker and ACE-I.   Recent Labs: 09/15/2015: ALT 54 09/16/2015: BUN 8; Creatinine, Ser 0.81; Hemoglobin 13.7; Platelets 223; Potassium 3.9; Sodium 137 09/17/2015: TSH 5.921*    Lipid Panel    Component Value Date/Time   CHOL 216* 09/16/2015 0335   TRIG 635* 09/16/2015 0335   HDL 32* 09/16/2015 0335   CHOLHDL 6.8 09/16/2015 0335   VLDL UNABLE TO CALCULATE IF TRIGLYCERIDE OVER 400 mg/dL 86/57/8469 6295   LDLCALC UNABLE TO CALCULATE IF TRIGLYCERIDE OVER 400 mg/dL  28/41/3244 0102     Wt Readings from Last 3 Encounters:  09/27/15 231 lb (104.781 kg)  09/18/15 229 lb 15 oz (104.3 kg)  03/08/14 210 lb (95.255 kg)     Other studies Reviewed: Additional studies/ records that were reviewed today include: Hospital records and testing.  ASSESSMENT AND PLAN:  1.  Inferior STEMI, subsequent episode of care: Mr. Yandell is recovering well from a cardiac standpoint. He is encouraged to continue the aspirin, statin, Brilinta, ACE inhibitor and beta blocker. He is encouraged to increase his activity as directed by inpatient cardiac rehabilitation and follow-up with outpatient cardiac rehabilitation.  History op involves a lot of walking around and he regularly has to lift boxes weighing approximately 50 pounds. Because of this, we will keep him out of work another 3 weeks. In the meantime, he is to work with cardiac rehabilitation on increasing his strength.  2. Left arm weakness: Dr. Anurag Lasso was evaluating him for possible embolic event related to his cardiac cath.  3. Hypertension: His systolic pressures under pretty good control. His diastolic pressure is generally 80-90. I suspect it runs higher when he is up and around and active. He is encouraged to follow a healthy lifestyle, losing weight and becoming fit. If he does this, he may not need any medication changes. Otherwise, consider adding amlodipine or diuretic to decrease his diastolic pressure.  4. Hyperlipidemia with hypertriglyceridemia: He is to remain on the medication and get a lipid profile prior to following up with Dr. Tresa Endo in 3 months. If dietary changes and a medications do not improve his profile, he may need fenofibrate.   Current medicines are reviewed at length with the patient today.  The patient does not have concerns regarding medicines.  The following changes have been made:  no change  Labs/ tests ordered today include:  Orders Placed This Encounter  Procedures  . Hepatic function  panel  . Lipid panel  . EKG 12-Lead     Disposition:   FU with Dr. Tresa Endo  Signed, Leanna Battles  09/27/2015 9:48 AM    [facility] [address] [place], [place], [place]  [address] Phone: [phone]; Fax: [phone]

## 2015-10-03 ENCOUNTER — Telehealth (HOSPITAL_COMMUNITY): Payer: Self-pay | Admitting: *Deleted

## 2015-10-03 NOTE — Telephone Encounter (Signed)
Received Referral for cardiac rehab. Noted in epic pt requested Richard Hull phase II cardiac rehab program.  Faxed referral form to [facility]. Alanson Aly, BSN

## 2015-10-05 ENCOUNTER — Ambulatory Visit
Admission: RE | Admit: 2015-10-05 | Discharge: 2015-10-05 | Disposition: A | Discharge status: Home / Self Care | Payer: BLUE CROSS/BLUE SHIELD | Source: Ambulatory Visit | Attending: Internal Medicine | Admitting: Internal Medicine

## 2015-10-05 ENCOUNTER — Other Ambulatory Visit: Payer: BLUE CROSS/BLUE SHIELD

## 2015-10-05 ENCOUNTER — Other Ambulatory Visit: Payer: Self-pay | Admitting: Internal Medicine

## 2015-10-05 DIAGNOSIS — R29898 Other symptoms and signs involving the musculoskeletal system: Secondary | ICD-10-CM

## 2015-10-05 MED ORDER — GADOBENATE DIMEGLUMINE 529 MG/ML IV SOLN
20.0000 mL | Freq: Once | INTRAVENOUS | Status: AC | PRN
  Administered 2015-10-05: 20 mL via INTRAVENOUS

## 2015-11-02 ENCOUNTER — Encounter (HOSPITAL_COMMUNITY): Payer: Self-pay | Admitting: Occupational Therapy

## 2015-11-02 ENCOUNTER — Ambulatory Visit (HOSPITAL_COMMUNITY): Payer: BLUE CROSS/BLUE SHIELD | Attending: Internal Medicine | Admitting: Occupational Therapy

## 2015-11-02 DIAGNOSIS — M25612 Stiffness of left shoulder, not elsewhere classified: Secondary | ICD-10-CM | POA: Diagnosis present

## 2015-11-02 DIAGNOSIS — M629 Disorder of muscle, unspecified: Secondary | ICD-10-CM | POA: Insufficient documentation

## 2015-11-02 DIAGNOSIS — M6289 Other specified disorders of muscle: Secondary | ICD-10-CM

## 2015-11-02 DIAGNOSIS — M25512 Pain in left shoulder: Secondary | ICD-10-CM

## 2015-11-02 DIAGNOSIS — R29898 Other symptoms and signs involving the musculoskeletal system: Secondary | ICD-10-CM | POA: Insufficient documentation

## 2015-11-02 NOTE — Patient Instructions (Signed)
  1) Flexion Wall Stretch    Face wall, place affected handon wall in front of you. Slide hand up the wall  and lean body in towards the wall. Hold for 5 seconds. Repeat 10 times. 1-2 times/day.     2) Towel Stretch with Internal Rotation   Gently pull up your affected arm  behind your back with the assist of a towel            3) Corner Stretch    Stand at a corner of a wall, place your arms on the walls with elbows bent. Lean into the corner until a stretch is felt along the front of your chest and/or shoulders. Hold for 5 seconds. Repeat 10X, 1-2 times/day.    4) Posterior Capsule Stretch    Bring the involved arm across chest. Grasp elbow and pull toward chest until you feel a stretch in the back of the upper arm and shoulder. Hold 5 seconds. Repeat 10X. Complete 1-2 times/day.    5) Scapular Retraction    Tuck chin back as you pinch shoulder blades together.  Hold 5 seconds. Repeat 10X. Complete 1-2 times/day.    6) External Rotation Stretch:     Place your affected hand on the wall with the elbow bent and gently turn your body the opposite direction until a stretch is felt.  

## 2015-11-02 NOTE — Therapy (Signed)
[facility] [address] [place], [place], [address] Phone: [phone]   Fax:  [phone]  Occupational Therapy Evaluation  Patient Details  Name: Richard Hull MRN: [id number] Date of Birth: 09-22-1975 Referring Provider: Dr. [name]  Encounter Date: 11/02/2015      OT End of Session - 11/02/15 1628    Visit Number 1   Number of Visits 12   Date for OT Re-Evaluation 12/17/15  mini reassess 12/02/2014   Authorization Type BCBS    Authorization Time Period 60 visit limit-OT/PT/SLP   Authorization - Visit Number 1   Authorization - Number of Visits 60   OT Start Time 1300   OT Stop Time 1344   OT Time Calculation (min) 44 min   Activity Tolerance Patient tolerated treatment well   Behavior During Therapy [facility] for tasks assessed/performed      Past Medical History  Diagnosis Date  . Hypertension   . Coronary artery disease 08/2015  . Myocardial infarction (HCC) 08/2015  . Hyperlipidemia LDL goal <70   . Hypertriglyceridemia     Past Surgical History  Procedure Laterality Date  . Tonsillectomy    . Cardiac catheterization N/A 09/15/2015    Procedure: Left Heart Cath and Coronary Angiography;  Surgeon: Lennette Bihari, MD; LAD 30%, pRCA 30%, dRCA 30%, RPDA 99%, nl LV function   . Cardiac catheterization N/A 09/15/2015    Procedure: Coronary Stent Intervention;  Surgeon: Lennette Bihari, MD; 3.515 mm Xience Alpine DES to dRCA/RPDA>> 5% residual       There were no vitals filed for this visit.  Visit Diagnosis:  Weakness of left upper extremity  Pain in left shoulder  Tight fascia  Shoulder stiffness, left      Subjective Assessment - 11/02/15 1355    Subjective  S: I can't lift a 10 pound weight.    Pertinent History Pt is a 40 y/o male presenting with left arm weakness that began in mid-January. Pt experiences intermittent pain in his neck and left shoulder, also tension headaches at times. Pt uses heat and has  recently started using ice for pain management as well. Dr. [name] referred pt to occupational therapy for evaluation and treatment.    Special Tests FOTO Score: 47/100 (53% impairment)   Patient Stated Goals To be able to lift weight normally    Currently in Pain? Yes   Pain Score 3    Pain Location Shoulder   Pain Orientation Left   Pain Descriptors / Indicators Aching   Pain Type Acute pain   Pain Radiating Towards neck to elbow   Pain Onset More than a month ago   Pain Frequency Intermittent   Aggravating Factors  use, lifting   Pain Relieving Factors heat, rest   Effect of Pain on Daily Activities limited ability to use LUE during work and daily tasks.    Multiple Pain Sites No           [facility] OT Assessment - 11/02/15 1257    Assessment   Diagnosis left arm weakness   Referring Provider Dr. [name]   Onset Date 09/04/15   Prior Therapy None   Precautions   Precautions None   Restrictions   Weight Bearing Restrictions No   Balance Screen   Has the patient fallen in the past 6 months No   Has the patient had a decrease in activity level because of a fear of falling?  No   Is  the patient reluctant to leave their home because of a fear of falling?  No   Prior Function   Level of Independence Independent with basic ADLs   Vocation Full time employment   Vocation Requirements [employer]-lifting, reaching   Leisure garden, play guitar, paint   ADL   ADL comments Pt is having difficulty reaching for a seatebelt, moving arm behind back to tuck in shirts, scratching back, putting on suspenders, reaching objects on high shelves, lifting weight above 5 pounds.    Written Expression   Dominant Hand Right   Vision - History   Baseline Vision No visual deficits   Cognition   Overall Cognitive Status Within Functional Limits for tasks assessed   ROM / Strength   AROM / PROM / Strength AROM;Strength;PROM   Palpation   Palpation comment Pt has mod fascial restrictions  along elbow, upper arm, trapezius, serratus anterior, and scapularis regions   AROM   Overall AROM Comments Assessed in sitting, ER/IR adducted    AROM Assessment Site Shoulder;Elbow   Right/Left Shoulder Left   Left Shoulder Flexion 162 Degrees   Left Shoulder ABduction 148 Degrees   Left Shoulder Internal Rotation 90 Degrees   Left Shoulder External Rotation 60 Degrees   Right/Left Elbow --   PROM   Overall PROM Comments Assessed supine, ER/IR adducted   PROM Assessment Site Shoulder   Right/Left Shoulder Left   Left Shoulder Flexion 180 Degrees   Left Shoulder ABduction 180 Degrees   Left Shoulder Internal Rotation 90 Degrees   Left Shoulder External Rotation 80 Degrees   Strength   Overall Strength Comments Assessed seated, ER/IR adducted   Strength Assessment Site Shoulder;Elbow   Right/Left Shoulder Left   Left Shoulder Flexion 4-/5   Left Shoulder ABduction 3+/5   Left Shoulder Internal Rotation 3+/5   Left Shoulder External Rotation 4-/5   Right/Left Elbow Left   Left Elbow Flexion 4-/5   Left Elbow Extension 4-/5                         OT Education - 11/02/15 1329    Education provided Yes   Education Details shoulder stretches   Person(s) Educated Patient   Methods Explanation;Demonstration;Handout   Comprehension Verbalized understanding;Returned demonstration          OT Short Term Goals - 11/02/15 1634    OT SHORT TERM GOAL #1   Title Pt will be educated on and independent in HEP.    Time 6   Period Weeks   Status New   OT SHORT TERM GOAL #2   Title Pt will return to prior level of functioning and independence in ADL and work tasks.    Time 6   Period Weeks   Status New   OT SHORT TERM GOAL #3   Title Pt will decrease fascial restrictions in LUE from mod to min amounts or less to increase LUE mobility.    Time 6   Period Weeks   Status New   OT SHORT TERM GOAL #4   Title Pt will decrease pain in LUE to 2/10 or less during  daily tasks.    Time 6   Period Weeks   Status New   OT SHORT TERM GOAL #5   Title Pt will increase LUE A/ROM to WNL to increase ability to reach overhead shelves at home and work.    Time 6   Period Weeks   Status New  Additional Short Term Goals   Additional Short Term Goals Yes   OT SHORT TERM GOAL #6   Title Pt will increase LUE strength to 4+/5 to increase ability to lift weighted containers and boxes at work.    Time 6   Period Weeks   Status New                  Plan - 11/02/15 1629    Clinical Impression Statement A: Pt is a 40 y/o male presenting with increase pain and fascial restrictions, decreased range of motion, strength, and functional use of LUE present since mid-January 2017, impacting the pt's ability to complete daily tasks at home and work at his highest functional level. During evaluation pt notes concern over elbow strength-was able to complete 10# bicep curl with gravity eliminated, and against gravity once elbow assisted to 90 degress. Provided pt with shoulder stretches for HEP.    Pt will benefit from skilled therapeutic intervention in order to improve on the following deficits (Retired) Decreased strength;Pain;Impaired UE functional use;Decreased range of motion;Increased fascial restricitons;Impaired flexibility   Rehab Potential Good   OT Frequency 2x / week   OT Duration 6 weeks   OT Treatment/Interventions Self-care/ADL training;DME and/or AE instruction;Passive range of motion;Patient/family education;Cryotherapy;Electrical Stimulation;Moist Heat;Therapeutic exercise;Manual Therapy;Therapeutic activities   Plan P: Pt will benefit from skilled OT services to decrease pain and fascial restrictions, increase range of motion and strength, and increase functional use of LUE during daily and work tasks. Treatment plan: myofascial release, manual stretching, P/ROM, A/ROM, LUE strengthening, scapular stability and strengthening, patient education   OT  Home Exercise Plan shoulder stretches   Consulted and Agree with Plan of Care Patient        Problem List Patient Active Problem List   Diagnosis Date Noted  . Elevated TSH 09/18/2015  . Stented coronary artery   . Hyperglycemia 09/16/2015  . Hyperlipidemia LDL goal <70 09/16/2015  . Hypertension, malignant 09/15/2015  . ST elevation (STEMI) myocardial infarction involving right coronary artery [facility]) 09/15/2015    Ezra Sites, OTR/L  [phone]  11/02/2015, 4:39 PM  [facility] [address] [place], [place], [address] Phone: [phone]   Fax:  [phone]  Name: Richard Hull MRN: [id number] Date of Birth: [birth year]

## 2015-11-08 ENCOUNTER — Ambulatory Visit (HOSPITAL_COMMUNITY): Payer: BLUE CROSS/BLUE SHIELD

## 2015-11-08 ENCOUNTER — Encounter (HOSPITAL_COMMUNITY): Payer: Self-pay

## 2015-11-08 DIAGNOSIS — M25612 Stiffness of left shoulder, not elsewhere classified: Secondary | ICD-10-CM

## 2015-11-08 DIAGNOSIS — R29898 Other symptoms and signs involving the musculoskeletal system: Secondary | ICD-10-CM | POA: Diagnosis not present

## 2015-11-08 DIAGNOSIS — M25512 Pain in left shoulder: Secondary | ICD-10-CM

## 2015-11-08 DIAGNOSIS — M629 Disorder of muscle, unspecified: Secondary | ICD-10-CM

## 2015-11-08 DIAGNOSIS — M6289 Other specified disorders of muscle: Secondary | ICD-10-CM

## 2015-11-08 NOTE — Patient Instructions (Signed)

## 2015-11-08 NOTE — Therapy (Signed)
[facility] [address] [place], [place], [address] Phone: [phone]   Fax:  [phone]  Occupational Therapy Treatment  Patient Details  Name: Richard Hull MRN: [id number] Date of Birth: [birth year] Referring Provider: Dr. [name]  Encounter Date: 11/08/2015      OT End of Session - 11/08/15 1430    Visit Number 2   Number of Visits 12   Date for OT Re-Evaluation 12/17/15   Authorization Type BCBS    Authorization Time Period 60 visit limit-OT/PT/SLP   Authorization - Visit Number 2   Authorization - Number of Visits 60   OT Start Time 1345   OT Stop Time 1430   OT Time Calculation (min) 45 min   Activity Tolerance Patient tolerated treatment well   Behavior During Therapy [facility] for tasks assessed/performed      Past Medical History  Diagnosis Date  . Hypertension   . Coronary artery disease 08/2015  . Myocardial infarction (HCC) 08/2015  . Hyperlipidemia LDL goal <70   . Hypertriglyceridemia     Past Surgical History  Procedure Laterality Date  . Tonsillectomy    . Cardiac catheterization N/A 09/15/2015    Procedure: Left Heart Cath and Coronary Angiography;  Surgeon: Lennette Bihari, MD; LAD 30%, pRCA 30%, dRCA 30%, RPDA 99%, nl LV function   . Cardiac catheterization N/A 09/15/2015    Procedure: Coronary Stent Intervention;  Surgeon: Lennette Bihari, MD; 3.515 mm Xience Alpine DES to dRCA/RPDA>> 5% residual       There were no vitals filed for this visit.  Visit Diagnosis:  Weakness of left upper extremity  Pain in left shoulder  Tight fascia  Shoulder stiffness, left      Subjective Assessment - 11/08/15 1353    Subjective  After my last session my bicep was really tight.   Pain Score 0-No pain            [facility] OT Assessment - 11/08/15 1351    Assessment   Diagnosis left arm weakness   Referring Provider Dr. [name]   Precautions   Precautions None                  OT  Treatments/Exercises (OP) - 11/08/15 1351    Exercises   Exercises Shoulder   Shoulder Exercises: Supine   Protraction AROM;10 reps   Horizontal ABduction PROM;5 reps;AROM;10 reps   External Rotation PROM;5 reps;AROM;10 reps   Internal Rotation PROM;5 reps;AROM;10 reps   Flexion PROM;5 reps;AROM;10 reps   ABduction PROM;5 reps;AROM;10 reps   Shoulder Exercises: Prone   Other Prone Exercises [place] Exercises 10X   Shoulder Exercises: Sidelying   External Rotation Strengthening;10 reps   External Rotation Weight (lbs) 1   Internal Rotation Strengthening;10 reps   Internal Rotation Weight (lbs) 1   Flexion Strengthening;10 reps   Flexion Weight (lbs) 1   ABduction Strengthening;10 reps   ABduction Weight (lbs) 1   Shoulder Exercises: Standing   Protraction Strengthening;10 reps   Protraction Weight (lbs) 1   Horizontal ABduction Strengthening;10 reps   Horizontal ABduction Weight (lbs) 1   External Rotation Strengthening;10 reps   External Rotation Weight (lbs) 1   Internal Rotation Strengthening;10 reps   Internal Rotation Weight (lbs) 1   Flexion Strengthening;10 reps   Shoulder Flexion Weight (lbs) 1   ABduction Strengthening;10 reps   Shoulder ABduction Weight (lbs) 1   Extension Theraband;12 reps   Theraband Level (Shoulder Extension) Level 2 (Red)  Row Theraband;12 reps   Theraband Level (Shoulder Row) Level 2 (Red)   Retraction Theraband;12 reps   Theraband Level (Shoulder Retraction) Level 2 (Red)   Shoulder Exercises: ROM/Strengthening   Proximal Shoulder Strengthening, Supine 10X 1# weight   Manual Therapy   Manual Therapy Myofascial release   Manual therapy comments Manual therapy completed prior to exercises.   Myofascial Release Myofascial release and manual stretching to left upper arm, trapezius, and scapularis region to decrease fascial restrictions and increase joint mobility in a pain free zone.                OT Education - 11/08/15 1429     Education provided Yes   Education Details Gave patient prinout of eval and reviewed goals. Added scapular exercises with theraband to HEP.    Person(s) Educated Patient   Methods Explanation;Demonstration;Handout;Verbal cues;Tactile cues   Comprehension Verbalized understanding;Returned demonstration          OT Short Term Goals - 11/08/15 1436    OT SHORT TERM GOAL #1   Title Pt will be educated on and independent in HEP.    Time 6   Period Weeks   Status On-going   OT SHORT TERM GOAL #2   Title Pt will return to prior level of functioning and independence in ADL and work tasks.    Time 6   Period Weeks   Status On-going   OT SHORT TERM GOAL #3   Title Pt will decrease fascial restrictions in LUE from mod to min amounts or less to increase LUE mobility.    Time 6   Period Weeks   Status On-going   OT SHORT TERM GOAL #4   Title Pt will decrease pain in LUE to 2/10 or less during daily tasks.    Time 6   Period Weeks   Status On-going   OT SHORT TERM GOAL #5   Title Pt will increase LUE A/ROM to WNL to increase ability to reach overhead shelves at home and work.    Time 6   Period Weeks   Status On-going   OT SHORT TERM GOAL #6   Title Pt will increase LUE strength to 4+/5 to increase ability to lift weighted containers and boxes at work.    Time 6   Period Weeks   Status On-going                  Plan - 11/08/15 1431    Clinical Impression Statement A: Initiated myofascial release, manual stretching, A/ROM supine, progressed to strengthening standing. Foscused on scapular strength. Therapist provided verbal cues for form and technique. Overall, patient did really well with reports of muscle fatigue.   Plan P: Follw up on updated HEP. Focus on form when standing, eliminate compensatory movements. Add X to V arms and W arms next session.         Problem List Patient Active Problem List   Diagnosis Date Noted  . Elevated TSH 09/18/2015  . Stented  coronary artery   . Hyperglycemia 09/16/2015  . Hyperlipidemia LDL goal <70 09/16/2015  . Hypertension, malignant 09/15/2015  . ST elevation (STEMI) myocardial infarction involving right coronary artery [facility]) 09/15/2015    [name], OTR/L,CBIS  [phone]  11/08/2015, 2:38 PM  [facility] [address] [place], [place], [address] Phone: [phone]   Fax:  [phone]  Name: ELDA SMUCKER MRN: [id number] Date of Birth: 09-28-1975

## 2015-11-10 ENCOUNTER — Ambulatory Visit (HOSPITAL_COMMUNITY): Payer: BLUE CROSS/BLUE SHIELD | Admitting: Specialist

## 2015-11-10 DIAGNOSIS — R29898 Other symptoms and signs involving the musculoskeletal system: Secondary | ICD-10-CM | POA: Diagnosis not present

## 2015-11-10 DIAGNOSIS — M25512 Pain in left shoulder: Secondary | ICD-10-CM

## 2015-11-10 DIAGNOSIS — M25612 Stiffness of left shoulder, not elsewhere classified: Secondary | ICD-10-CM

## 2015-11-10 NOTE — Therapy (Signed)
[facility] [address] [place], [place], [address] Phone: [phone]   Fax:  [phone]  Occupational Therapy Treatment  Patient Details  Name: Richard Hull MRN: [id number] Date of Birth: Jan 22, 1976 Referring Provider: Dr. [name]  Encounter Date: 11/10/2015      OT End of Session - 11/10/15 1514    Visit Number 3   Number of Visits 12   Date for OT Re-Evaluation 12/17/15   Authorization Type BCBS    Authorization Time Period 60 visit limit-OT/PT/SLP   Authorization - Visit Number 3   Authorization - Number of Visits 60   OT Start Time 1345   OT Stop Time 1435   OT Time Calculation (min) 50 min   Activity Tolerance Patient tolerated treatment well   Behavior During Therapy [facility] for tasks assessed/performed      Past Medical History  Diagnosis Date  . Hypertension   . Coronary artery disease 08/2015  . Myocardial infarction (HCC) 08/2015  . Hyperlipidemia LDL goal <70   . Hypertriglyceridemia     Past Surgical History  Procedure Laterality Date  . Tonsillectomy    . Cardiac catheterization N/A 09/15/2015    Procedure: Left Heart Cath and Coronary Angiography;  Surgeon: Lennette Bihari, MD; LAD 30%, pRCA 30%, dRCA 30%, RPDA 99%, nl LV function   . Cardiac catheterization N/A 09/15/2015    Procedure: Coronary Stent Intervention;  Surgeon: Lennette Bihari, MD; 3.515 mm Xience Alpine DES to dRCA/RPDA>> 5% residual       There were no vitals filed for this visit.  Visit Diagnosis:  Weakness of left upper extremity  Pain in left shoulder  Shoulder stiffness, left      Subjective Assessment - 11/10/15 1459    Subjective  S:  My shoulder is feeling better but still painful, but my bicep os sore   Currently in Pain? Yes   Pain Score 3    Pain Location Shoulder   Pain Orientation Left   Pain Descriptors / Indicators Aching   Pain Type Acute pain            [facility] OT Assessment - 11/10/15 0001    Assessment    Diagnosis left arm weakness   Prior Therapy None   Precautions   Precautions None   Restrictions   Weight Bearing Restrictions No                  OT Treatments/Exercises (OP) - 11/10/15 0001    Exercises   Exercises Shoulder;Elbow   Shoulder Exercises: Supine   Protraction PROM;10 reps   Horizontal ABduction PROM;10 reps   External Rotation PROM;10 reps   Internal Rotation PROM;10 reps   Flexion PROM;10 reps   ABduction PROM;10 reps   Shoulder Exercises: Seated   Protraction AROM;10 reps   Horizontal ABduction AROM;10 reps   External Rotation AROM;10 reps   Internal Rotation AROM;10 reps   Flexion AROM;10 reps   Abduction AROM;10 reps   Shoulder Exercises: ROM/Strengthening   UBE (Upper Arm Bike) 3' forward and 3' reverse at 1.0 resistance   Elbow Exercises   Theraband Level (Elbow Flexion) Level 2 (Red)   Elbow Flexion Limitations 10 times with supinated, pronated and hammer curl   Elbow Extension Strengthening;10 reps;Theraband   Theraband Level (Elbow Extension) Level 2 (Red)   Forearm Supination Strengthening;10 reps  5 pounds   Forearm Pronation Strengthening;10 reps  5 pounds   Other elbow exercises max lift rep for  elbow flexion with 5 pounds was 4 repetitions with supinated and hammer position.     Other elbow exercises completed 0 to 90 elbow flexion with 5# 3 times before total fatigue and 6 times 90 to 150 before fatigued   Additional Elbow Exercises   Wall Pushups/Modified Pushups 10 times   Manual Therapy   Manual Therapy Myofascial release   Manual therapy comments Manual therapy completed prior to exercises.   Myofascial Release Myofascial release and manual stretching to left upper arm, trapezius, and scapularis region to decrease fascial restrictions and increase joint mobility in a pain free zone.                OT Education - 11/10/15 1513    Education provided Yes   Education Details elbow flexion with supinated, pronated,  and hammer forearm position with green tband, completing elbow flexion with 5# recording max reps he can complete befoore arm fatigues          OT Short Term Goals - 11/08/15 1436    OT SHORT TERM GOAL #1   Title Pt will be educated on and independent in HEP.    Time 6   Period Weeks   Status On-going   OT SHORT TERM GOAL #2   Title Pt will return to prior level of functioning and independence in ADL and work tasks.    Time 6   Period Weeks   Status On-going   OT SHORT TERM GOAL #3   Title Pt will decrease fascial restrictions in LUE from mod to min amounts or less to increase LUE mobility.    Time 6   Period Weeks   Status On-going   OT SHORT TERM GOAL #4   Title Pt will decrease pain in LUE to 2/10 or less during daily tasks.    Time 6   Period Weeks   Status On-going   OT SHORT TERM GOAL #5   Title Pt will increase LUE A/ROM to WNL to increase ability to reach overhead shelves at home and work.    Time 6   Period Weeks   Status On-going   OT SHORT TERM GOAL #6   Title Pt will increase LUE strength to 4+/5 to increase ability to lift weighted containers and boxes at work.    Time 6   Period Weeks   Status On-going                  Plan - 11/10/15 1515    Clinical Impression Statement A:  Patient fatigues with elbow flexion with 5 pounds after 4 repetitions, weaker from full extension to 90 degrees flexion than 90 degrees flexion to full flexion.  Patient is also having difficulty and increased pain with full internal rotation with shoulder abducted.    Plan P:  Follow up on HEP, improve internal rotation in order to reach mid back to fasten suspenders, improve biceps strength in supinated, pronated, and hammer position through full range.        Problem List Patient Active Problem List   Diagnosis Date Noted  . Elevated TSH 09/18/2015  . Stented coronary artery   . Hyperglycemia 09/16/2015  . Hyperlipidemia LDL goal <70 09/16/2015  . Hypertension,  malignant 09/15/2015  . ST elevation (STEMI) myocardial infarction involving right coronary artery [facility]) 09/15/2015    Shirlean Mylar, OTR/L [phone]  11/10/2015, 3:18 PM  [facility] [address] [place], [place], [address] Phone: [phone]  Fax:  [phone]  Name: Richard Hull MRN: [id number] Date of Birth: 1976/01/25

## 2015-11-15 ENCOUNTER — Encounter (HOSPITAL_COMMUNITY): Payer: BLUE CROSS/BLUE SHIELD | Admitting: Occupational Therapy

## 2015-11-16 ENCOUNTER — Ambulatory Visit (HOSPITAL_COMMUNITY): Payer: BLUE CROSS/BLUE SHIELD | Admitting: Specialist

## 2015-11-16 DIAGNOSIS — M25612 Stiffness of left shoulder, not elsewhere classified: Secondary | ICD-10-CM

## 2015-11-16 DIAGNOSIS — R29898 Other symptoms and signs involving the musculoskeletal system: Secondary | ICD-10-CM | POA: Diagnosis not present

## 2015-11-16 DIAGNOSIS — M25512 Pain in left shoulder: Secondary | ICD-10-CM

## 2015-11-16 NOTE — Therapy (Signed)
[facility] [address] [place], [place], [address] Phone: [phone]   Fax:  [phone]  Occupational Therapy Treatment  Patient Details  Name: Richard Hull MRN: [id number] Date of Birth: Mar 15, 1976 Referring Provider: Dr. [name]  Encounter Date: 11/16/2015      OT End of Session - 11/16/15 1522    Visit Number 4   Number of Visits 12   Date for OT Re-Evaluation 12/17/15   Authorization Type BCBS    Authorization Time Period 60 visit limit-OT/PT/SLP   Authorization - Visit Number 4   Authorization - Number of Visits 60   OT Start Time 1440   OT Stop Time 1520   OT Time Calculation (min) 40 min   Activity Tolerance Patient tolerated treatment well   Behavior During Therapy [facility] for tasks assessed/performed      Past Medical History  Diagnosis Date  . Hypertension   . Coronary artery disease 08/2015  . Myocardial infarction (HCC) 08/2015  . Hyperlipidemia LDL goal <70   . Hypertriglyceridemia     Past Surgical History  Procedure Laterality Date  . Tonsillectomy    . Cardiac catheterization N/A 09/15/2015    Procedure: Left Heart Cath and Coronary Angiography;  Surgeon: Lennette Bihari, MD; LAD 30%, pRCA 30%, dRCA 30%, RPDA 99%, nl LV function   . Cardiac catheterization N/A 09/15/2015    Procedure: Coronary Stent Intervention;  Surgeon: Lennette Bihari, MD; 3.515 mm Xience Alpine DES to dRCA/RPDA>> 5% residual       There were no vitals filed for this visit.  Visit Diagnosis:  Weakness of left upper extremity  Pain in left shoulder  Shoulder stiffness, left      Subjective Assessment - 11/16/15 1513    Subjective  S:  After last session I sat through an entire lecture without any pain in my shoulder and neck   Currently in Pain? No/denies            [facility] OT Assessment - 11/16/15 0001    Assessment   Diagnosis left arm weakness   Precautions   Precautions None   Restrictions   Weight Bearing  Restrictions No                  OT Treatments/Exercises (OP) - 11/16/15 0001    Exercises   Exercises Shoulder;Elbow   Shoulder Exercises: Supine   Protraction PROM;10 reps   Horizontal ABduction PROM;10 reps   External Rotation PROM;10 reps   Internal Rotation PROM;10 reps   Flexion PROM;10 reps   ABduction PROM;10 reps   Shoulder Exercises: Prone   Retraction Strengthening;10 reps   Retraction Weight (lbs) 5   Extension Strengthening;10 reps   Extension Weight (lbs) 5   External Rotation Strengthening;10 reps   External Rotation Weight (lbs) 5   Internal Rotation Strengthening;10 reps   Internal Rotation Weight (lbs) 5   Horizontal ABduction 1 Strengthening;10 reps   Horizontal ABduction 1 Weight (lbs) 5   Horizontal ABduction 2 Strengthening;10 reps   Horizontal ABduction 2 Weight (lbs) 5   Shoulder Exercises: ROM/Strengthening   UBE (Upper Arm Bike) 3' forward and 3' reverse at 2.0 resistance   Other ROM/Strengthening Exercises segmented movements needed for reaching into internal rotation to fasten suspenders:  with 3# weight abducted shoulder to 90 10 times, then with shoulder abducted to 90, extended shoulder 10 times, then with shoulder abducted to 90 and extended, internally rotated 10 times iwth max difficulty  Elbow Exercises   Other elbow exercises max lift rep for elbow flexion with 5 pounds was 8 repetitions with supinated and hammer position.     Other elbow exercises 5# elbow curls:  cross body hammer curl, hammer curl, reverse grip barbell curl 10 times each   Manual Therapy   Manual Therapy Myofascial release   Manual therapy comments Manual therapy completed prior to exercises.   Myofascial Release Myofascial release and manual stretching to left upper arm, trapezius, and scapularis region to decrease fascial restrictions and increase joint mobility in a pain free zone.  added manual cervical traction and myofascial release to cervical region.                     OT Short Term Goals - 11/08/15 1436    OT SHORT TERM GOAL #1   Title Pt will be educated on and independent in HEP.    Time 6   Period Weeks   Status On-going   OT SHORT TERM GOAL #2   Title Pt will return to prior level of functioning and independence in ADL and work tasks.    Time 6   Period Weeks   Status On-going   OT SHORT TERM GOAL #3   Title Pt will decrease fascial restrictions in LUE from mod to min amounts or less to increase LUE mobility.    Time 6   Period Weeks   Status On-going   OT SHORT TERM GOAL #4   Title Pt will decrease pain in LUE to 2/10 or less during daily tasks.    Time 6   Period Weeks   Status On-going   OT SHORT TERM GOAL #5   Title Pt will increase LUE A/ROM to WNL to increase ability to reach overhead shelves at home and work.    Time 6   Period Weeks   Status On-going   OT SHORT TERM GOAL #6   Title Pt will increase LUE strength to 4+/5 to increase ability to lift weighted containers and boxes at work.    Time 6   Period Weeks   Status On-going                  Plan - 11/16/15 1523    Clinical Impression Statement A:  Patient able to complete 8 repetitions with 5# prior to fatiguing with biceps curl.  horizontal abduction in prone  with pinky leading is most painful and difficulty movement for patient.  segmented internal rotation and reaching behind back to strengthen each portion of each movement.    Plan P:  add partial elbow fleixon 0-90, 90-150 and reverse with bicep curl, hammer curl, and reverse hand bicep curl.  Focus on posterior deltoid strengthening.         Problem List Patient Active Problem List   Diagnosis Date Noted  . Elevated TSH 09/18/2015  . Stented coronary artery   . Hyperglycemia 09/16/2015  . Hyperlipidemia LDL goal <70 09/16/2015  . Hypertension, malignant 09/15/2015  . ST elevation (STEMI) myocardial infarction involving right coronary artery [facility]) 09/15/2015    Shirlean Mylar, OTR/L [phone]  11/16/2015, 3:30 PM   [facility] [address] [place], [place], [address] Phone: [phone]   Fax:  [phone]  Name: Richard Hull MRN: [id number] Date of Birth: 1976-03-28

## 2015-11-17 ENCOUNTER — Encounter (HOSPITAL_COMMUNITY): Payer: Self-pay

## 2015-11-17 ENCOUNTER — Telehealth: Payer: Self-pay

## 2015-11-17 ENCOUNTER — Ambulatory Visit (HOSPITAL_COMMUNITY): Payer: BLUE CROSS/BLUE SHIELD

## 2015-11-17 DIAGNOSIS — M6289 Other specified disorders of muscle: Secondary | ICD-10-CM

## 2015-11-17 DIAGNOSIS — M25512 Pain in left shoulder: Secondary | ICD-10-CM

## 2015-11-17 DIAGNOSIS — R29898 Other symptoms and signs involving the musculoskeletal system: Secondary | ICD-10-CM

## 2015-11-17 DIAGNOSIS — M25612 Stiffness of left shoulder, not elsewhere classified: Secondary | ICD-10-CM

## 2015-11-17 DIAGNOSIS — M629 Disorder of muscle, unspecified: Secondary | ICD-10-CM

## 2015-11-17 NOTE — Telephone Encounter (Signed)
Theodore Demark PA completed attending provider statement.Form faxed to [facility] at fax # [phone].Copy mailed to patient.

## 2015-11-17 NOTE — Therapy (Signed)
[facility] [address] [place], [place], [address] Phone: [phone]   Fax:  [phone]  Occupational Therapy Treatment  Patient Details  Name: Richard Hull MRN: [id number] Date of Birth: October 18, 1975 Referring Provider: Dr. [name]  Encounter Date: 11/17/2015      OT End of Session - 11/17/15 1623    Visit Number 5   Number of Visits 12   Date for OT Re-Evaluation 12/17/15  Mini reassess 12/02/15   Authorization Type BCBS    Authorization Time Period 60 visit limit-OT/PT/SLP   Authorization - Visit Number 5   Authorization - Number of Visits 60   OT Start Time 1520   OT Stop Time 1615   OT Time Calculation (min) 55 min   Activity Tolerance Patient tolerated treatment well   Behavior During Therapy [facility] for tasks assessed/performed      Past Medical History  Diagnosis Date  . Hypertension   . Coronary artery disease 08/2015  . Myocardial infarction (HCC) 08/2015  . Hyperlipidemia LDL goal <70   . Hypertriglyceridemia     Past Surgical History  Procedure Laterality Date  . Tonsillectomy    . Cardiac catheterization N/A 09/15/2015    Procedure: Left Heart Cath and Coronary Angiography;  Surgeon: Lennette Bihari, MD; LAD 30%, pRCA 30%, dRCA 30%, RPDA 99%, nl LV function   . Cardiac catheterization N/A 09/15/2015    Procedure: Coronary Stent Intervention;  Surgeon: Lennette Bihari, MD; 3.515 mm Xience Alpine DES to dRCA/RPDA>> 5% residual       There were no vitals filed for this visit.  Visit Diagnosis:  Weakness of left upper extremity  Pain in left shoulder  Shoulder stiffness, left  Tight fascia      Subjective Assessment - 11/17/15 1525    Subjective  S: More pain yesterday than today.   Currently in Pain? Yes   Pain Score 2    Pain Location Shoulder   Pain Orientation Left   Pain Descriptors / Indicators Aching   Pain Type Acute pain   Pain Onset Yesterday   Pain Frequency Intermittent    Aggravating Factors  Use, lifting   Pain Relieving Factors Rest   Effect of Pain on Daily Activities Limited ability to use LUE during work and daily tasks.   Multiple Pain Sites No            [facility] OT Assessment - 11/17/15 1529    Assessment   Diagnosis left arm weakness   Precautions   Precautions None                  OT Treatments/Exercises (OP) - 11/17/15 1528    Exercises   Exercises Shoulder;Elbow   Shoulder Exercises: Supine   Protraction PROM;10 reps   Horizontal ABduction PROM;10 reps   External Rotation PROM;10 reps   Internal Rotation PROM;10 reps   Flexion PROM;10 reps   ABduction PROM;10 reps   Shoulder Exercises: Prone   Retraction Strengthening;10 reps   Retraction Weight (lbs) 5   Extension Strengthening;10 reps   Extension Weight (lbs) 5   External Rotation Strengthening;10 reps   External Rotation Weight (lbs) 5   Internal Rotation Strengthening;10 reps   Internal Rotation Weight (lbs) 5   Horizontal ABduction 1 Strengthening;10 reps   Horizontal ABduction 1 Weight (lbs) 5   Horizontal ABduction 2 Strengthening;10 reps   Horizontal ABduction 2 Weight (lbs) 5   Shoulder Exercises: Standing   Other Standing Exercises 10  reps of elbow flexion from 0-90, 90-150, and 0-150 with 5lb weight pronated and supinated; 10 hammer curls; 10 reverse bicep curls   Elbow Exercises   Forearm Supination Strengthening;10 reps  5#   Forearm Pronation Strengthening;10 reps  5#   Modalities   Modalities Immunologist Parameters 40mA   Electrical Stimulation Goals Neuromuscular facilitation   Manual Therapy   Manual Therapy Myofascial release   Manual therapy comments Manual therapy completed prior to exercises.   Myofascial Release Myofascial release and manual stretching to left upper arm, trapezius, and scapularis  region to decrease fascial restrictions and increase joint mobility in a pain free zone.  added manual cervical traction and myofascial release to cervical region.                  OT Education - 11/17/15 1622    Education provided Yes   Education Details Explained reasons for use of ES as well as precautions and contraindications.   Person(s) Educated Patient   Methods Explanation   Comprehension Verbalized understanding          OT Short Term Goals - 11/08/15 1436    OT SHORT TERM GOAL #1   Title Pt will be educated on and independent in HEP.    Time 6   Period Weeks   Status On-going   OT SHORT TERM GOAL #2   Title Pt will return to prior level of functioning and independence in ADL and work tasks.    Time 6   Period Weeks   Status On-going   OT SHORT TERM GOAL #3   Title Pt will decrease fascial restrictions in LUE from mod to min amounts or less to increase LUE mobility.    Time 6   Period Weeks   Status On-going   OT SHORT TERM GOAL #4   Title Pt will decrease pain in LUE to 2/10 or less during daily tasks.    Time 6   Period Weeks   Status On-going   OT SHORT TERM GOAL #5   Title Pt will increase LUE A/ROM to WNL to increase ability to reach overhead shelves at home and work.    Time 6   Period Weeks   Status On-going   OT SHORT TERM GOAL #6   Title Pt will increase LUE strength to 4+/5 to increase ability to lift weighted containers and boxes at work.    Time 6   Period Weeks   Status On-going                  Plan - 11/17/15 1624    Clinical Impression Statement A: Introduced ES this session on [place] setting to increase bicep strength. Introduced elbow flexion from 0-90 and 90-150 as well as hammer curls, and reverse hand bicep curls.    Plan P: Follow up on ES, continue if effective. Focus on form during exercises, complete in sitting if necessary. Continue to strengthen bicep and deltoid for use in daily tasks.        Problem  List Patient Active Problem List   Diagnosis Date Noted  . Elevated TSH 09/18/2015  . Stented coronary artery   . Hyperglycemia 09/16/2015  . Hyperlipidemia LDL goal <70 09/16/2015  . Hypertension, malignant 09/15/2015  . ST elevation (STEMI) myocardial infarction involving right coronary artery (HCC) 09/15/2015   Herbert Moors, OTA student  [phone]. 11/17/2015, 4:33 PM  [facility] [address] [place], [place], [address] Phone: [phone]   Fax:  [phone]  Name: DELRICO MINEHART MRN: [id number] Date of Birth: January 31, 1976  [name], OTR/L,CBIS  [phone]  Note reviewed by clinical instructor and accurately reflects treatment session.

## 2015-11-22 ENCOUNTER — Ambulatory Visit (HOSPITAL_COMMUNITY): Payer: BLUE CROSS/BLUE SHIELD | Attending: Internal Medicine

## 2015-11-22 ENCOUNTER — Encounter (HOSPITAL_COMMUNITY): Payer: Self-pay

## 2015-11-22 DIAGNOSIS — M25512 Pain in left shoulder: Secondary | ICD-10-CM | POA: Insufficient documentation

## 2015-11-22 DIAGNOSIS — M25522 Pain in left elbow: Secondary | ICD-10-CM | POA: Insufficient documentation

## 2015-11-22 DIAGNOSIS — R29898 Other symptoms and signs involving the musculoskeletal system: Secondary | ICD-10-CM | POA: Insufficient documentation

## 2015-11-22 DIAGNOSIS — M25612 Stiffness of left shoulder, not elsewhere classified: Secondary | ICD-10-CM | POA: Diagnosis present

## 2015-11-22 DIAGNOSIS — M79602 Pain in left arm: Secondary | ICD-10-CM

## 2015-11-22 DIAGNOSIS — M6281 Muscle weakness (generalized): Secondary | ICD-10-CM

## 2015-11-22 NOTE — Therapy (Signed)
[facility] [address] [place], [place], [address] Phone: [phone]   Fax:  [phone]  Occupational Therapy Treatment  Patient Details  Name: Richard Hull MRN: [id number] Date of Birth: Apr 10, 1976 Referring Provider: Dr. [name]  Encounter Date: 11/22/2015      OT End of Session - 11/22/15 1608    Visit Number 6   Number of Visits 12   Date for OT Re-Evaluation 12/17/15  Mini reassess 12/02/15   Authorization Type BCBS    Authorization Time Period 60 visit limit-OT/PT/SLP   Authorization - Visit Number 6   Authorization - Number of Visits 60   OT Start Time 1515   OT Stop Time 1600   OT Time Calculation (min) 45 min   Activity Tolerance Patient tolerated treatment well   Behavior During Therapy [facility] for tasks assessed/performed      Past Medical History  Diagnosis Date  . Hypertension   . Coronary artery disease 08/2015  . Myocardial infarction (HCC) 08/2015  . Hyperlipidemia LDL goal <70   . Hypertriglyceridemia     Past Surgical History  Procedure Laterality Date  . Tonsillectomy    . Cardiac catheterization N/A 09/15/2015    Procedure: Left Heart Cath and Coronary Angiography;  Surgeon: Lennette Bihari, MD; LAD 30%, pRCA 30%, dRCA 30%, RPDA 99%, nl LV function   . Cardiac catheterization N/A 09/15/2015    Procedure: Coronary Stent Intervention;  Surgeon: Lennette Bihari, MD; 3.515 mm Xience Alpine DES to dRCA/RPDA>> 5% residual       There were no vitals filed for this visit.  Visit Diagnosis:  Muscle weakness (generalized)  Pain in left elbow  Pain In Left Arm      Subjective Assessment - 11/22/15 1515    Subjective  S: I had a 5 hour meeting I had to sit through today.   Currently in Pain? Yes   Pain Score 2    Pain Location Shoulder   Pain Orientation Left   Pain Descriptors / Indicators Aching   Pain Type Acute pain   Pain Onset Today   Pain Frequency Intermittent   Aggravating Factors   Sitting for long periods of time   Pain Relieving Factors Resting it   Effect of Pain on Daily Activities Limited ability to use LUE during work tasks.            [facility] OT Assessment - 11/22/15 1517    Assessment   Diagnosis left arm weakness   Precautions   Precautions None                  OT Treatments/Exercises (OP) - 11/22/15 0001    Exercises   Exercises Shoulder;Elbow   Shoulder Exercises: Supine   Protraction PROM;5 reps   Horizontal ABduction PROM;5 reps   External Rotation PROM;5 reps   Internal Rotation PROM;5 reps   Flexion PROM;5 reps   ABduction PROM;5 reps   Shoulder Exercises: Prone   Retraction Strengthening;10 reps   Retraction Weight (lbs) 5   Extension Strengthening;12 reps   Extension Weight (lbs) 5   External Rotation Strengthening;12 reps   External Rotation Weight (lbs) 5   Internal Rotation Strengthening;12 reps   Internal Rotation Weight (lbs) 5   Horizontal ABduction 1 Strengthening;12 reps   Horizontal ABduction 1 Weight (lbs) 5   Horizontal ABduction 2 Strengthening;12 reps   Horizontal ABduction 2 Weight (lbs) 5   Shoulder Exercises: Standing   Other Standing Exercises 10  reps of elbow flexion from 0-90, 90-150, and 0-150 with 5lb weight overhanded and underhanded; 10 hammer curls; 10 reverse bicep curls   Shoulder Exercises: ROM/Strengthening   UBE (Upper Arm Bike) 3' forward and 3' reverse at 3.0 resistance   Elbow Exercises   Forearm Supination Strengthening;10 reps  5lb   Forearm Pronation Strengthening;10 reps  5lb   Manual Therapy   Manual Therapy Myofascial release;Taping   Manual therapy comments Manual therapy completed prior to exercises.   Myofascial Release Myofascial release and manual stretching to left upper arm, trapezius, and scapularis region to decrease fascial restrictions and increase joint mobility in a pain free zone.  added manual cervical traction and myofascial release to cervical region.      Kinesiotex --  To treat trigger point pain in right forearm.                OT Education - 11/22/15 1605    Education provided Yes   Education Details Explained use of kinesiology tape and trigger point pain. Encouraged patient to do things that relieve his pain (ice, rest, positioning). Discussed precautions of kinesiology tape, how to remove, allergic reaction, etc.   Person(s) Educated Patient   Methods Explanation   Comprehension Verbalized understanding          OT Short Term Goals - 11/08/15 1436    OT SHORT TERM GOAL #1   Title Pt will be educated on and independent in HEP.    Time 6   Period Weeks   Status On-going   OT SHORT TERM GOAL #2   Title Pt will return to prior level of functioning and independence in ADL and work tasks.    Time 6   Period Weeks   Status On-going   OT SHORT TERM GOAL #3   Title Pt will decrease fascial restrictions in LUE from mod to min amounts or less to increase LUE mobility.    Time 6   Period Weeks   Status On-going   OT SHORT TERM GOAL #4   Title Pt will decrease pain in LUE to 2/10 or less during daily tasks.    Time 6   Period Weeks   Status On-going   OT SHORT TERM GOAL #5   Title Pt will increase LUE A/ROM to WNL to increase ability to reach overhead shelves at home and work.    Time 6   Period Weeks   Status On-going   OT SHORT TERM GOAL #6   Title Pt will increase LUE strength to 4+/5 to increase ability to lift weighted containers and boxes at work.    Time 6   Period Weeks   Status On-going                  Plan - 11/22/15 1608    Clinical Impression Statement A: Introduced kinesiology tape this session. Continued with exercises from last session. Patient is unable to complete full set of bicep curls, finished at 9 reps due to muscle activating but not producing the movement. Patient sees his doctor prior to his next session.   Plan P: Follow up on kinesiology tape and patient's visit to doctor.  Complete isometric exercises prior to strengthning to increase muscle activation.         Problem List Patient Active Problem List   Diagnosis Date Noted  . Elevated TSH 09/18/2015  . Stented coronary artery   . Hyperglycemia 09/16/2015  . Hyperlipidemia LDL goal <70 09/16/2015  .  Hypertension, malignant 09/15/2015  . ST elevation (STEMI) myocardial infarction involving right coronary artery [facility]) 09/15/2015    [name], OTR/L,CBIS  [phone]  11/22/2015, 4:20 PM  [facility] [address] [place], [place], [address] Phone: [phone]   Fax:  [phone]  Name: Richard Hull MRN: [id number] Date of Birth: Oct 23, 1975

## 2015-11-24 ENCOUNTER — Ambulatory Visit (HOSPITAL_COMMUNITY): Payer: BLUE CROSS/BLUE SHIELD

## 2015-11-24 ENCOUNTER — Encounter (HOSPITAL_COMMUNITY): Payer: Self-pay

## 2015-11-24 DIAGNOSIS — M25512 Pain in left shoulder: Secondary | ICD-10-CM

## 2015-11-24 DIAGNOSIS — M6281 Muscle weakness (generalized): Secondary | ICD-10-CM

## 2015-11-24 DIAGNOSIS — M25612 Stiffness of left shoulder, not elsewhere classified: Secondary | ICD-10-CM

## 2015-11-24 DIAGNOSIS — M25522 Pain in left elbow: Secondary | ICD-10-CM

## 2015-11-24 NOTE — Therapy (Signed)
[place] [facility] [address] [place], [place], [address] Phone: [phone]   Fax:  [phone]  Occupational Therapy Treatment  Patient Details  Name: Richard Hull MRN: [id number] Date of Birth: 19-Apr-1976 Referring Provider: Dr. [name]  Encounter Date: 11/24/2015      OT End of Session - 11/24/15 1650    Visit Number 7   Number of Visits 12   Date for OT Re-Evaluation 12/17/15  Mini reassess 12/02/15   Authorization Type BCBS    Authorization Time Period 60 visit limit-OT/PT/SLP   Authorization - Visit Number 7   Authorization - Number of Visits 60   OT Start Time 1520   OT Stop Time 1605   OT Time Calculation (min) 45 min   Activity Tolerance Patient tolerated treatment well   Behavior During Therapy [facility] for tasks assessed/performed      Past Medical History  Diagnosis Date  . Hypertension   . Coronary artery disease 08/2015  . Myocardial infarction (HCC) 08/2015  . Hyperlipidemia LDL goal <70   . Hypertriglyceridemia     Past Surgical History  Procedure Laterality Date  . Tonsillectomy    . Cardiac catheterization N/A 09/15/2015    Procedure: Left Heart Cath and Coronary Angiography;  Surgeon: Lennette Bihari, MD; LAD 30%, pRCA 30%, dRCA 30%, RPDA 99%, nl LV function   . Cardiac catheterization N/A 09/15/2015    Procedure: Coronary Stent Intervention;  Surgeon: Lennette Bihari, MD; 3.515 mm Xience Alpine DES to dRCA/RPDA>> 5% residual       There were no vitals filed for this visit.  Visit Diagnosis:  Muscle weakness (generalized)  Pain in left elbow  Shoulder stiffness, left  Pain in left shoulder      Subjective Assessment - 11/24/15 1520    Subjective  S: I was off today.   Currently in Pain? No/denies            [facility] OT Assessment - 11/24/15 1523    Assessment   Diagnosis left arm weakness   Precautions   Precautions None                  OT Treatments/Exercises (OP) - 11/24/15  1523    Exercises   Exercises Shoulder;Elbow   Shoulder Exercises: Supine   Protraction PROM;5 reps   Horizontal ABduction PROM;5 reps   External Rotation PROM;5 reps   Internal Rotation PROM;5 reps   Flexion PROM;5 reps   ABduction PROM;5 reps   Shoulder Exercises: Standing   Other Standing Exercises Zottman curls- 10# 1X, 8# 3X, 5# 6X; Half Hammer Curls- 10# 1X, 8# 5X, 5# 4X; Cross Body Hammer Curls- 8# 2X, 5# 6X; Overhand Curls- 10# 1X, 8# 1X, 5# 8X.   Shoulder Exercises: ROM/Strengthening   UBE (Upper Arm Bike) 2' forward and 2' reverse at 4.0 resistance   Cybex Press 10 reps  10 plate   Cybex Row 10 reps  7 plate   Shoulder Exercises: Isometric Strengthening   Flexion 3X3"  seated   Extension 3X3"  seated   External Rotation 3X3"  seated   Internal Rotation 3X3"  seated   ABduction 3X3"  seated   ADduction 3X3"  seated   Manual Therapy   Manual Therapy Myofascial release   Manual therapy comments Manual therapy completed prior to exercises.   Myofascial Release Myofascial release and manual stretching to left upper arm, trapezius, and scapularis region to decrease fascial restrictions and increase joint mobility in  a pain free zone.  added manual cervical traction and myofascial release to cervical region.                  OT Education - 11/24/15 1519    Education Details Provided HEP and explanation of bracialis muscle weakness.   Person(s) Educated Patient   Methods Explanation;Demonstration;Handout;Verbal cues;Tactile cues   Comprehension Verbalized understanding;Returned demonstration;Verbal cues required;Tactile cues required          OT Short Term Goals - 11/08/15 1436    OT SHORT TERM GOAL #1   Title Pt will be educated on and independent in HEP.    Time 6   Period Weeks   Status On-going   OT SHORT TERM GOAL #2   Title Pt will return to prior level of functioning and independence in ADL and work tasks.    Time 6   Period Weeks   Status  On-going   OT SHORT TERM GOAL #3   Title Pt will decrease fascial restrictions in LUE from mod to min amounts or less to increase LUE mobility.    Time 6   Period Weeks   Status On-going   OT SHORT TERM GOAL #4   Title Pt will decrease pain in LUE to 2/10 or less during daily tasks.    Time 6   Period Weeks   Status On-going   OT SHORT TERM GOAL #5   Title Pt will increase LUE A/ROM to WNL to increase ability to reach overhead shelves at home and work.    Time 6   Period Weeks   Status On-going   OT SHORT TERM GOAL #6   Title Pt will increase LUE strength to 4+/5 to increase ability to lift weighted containers and boxes at work.    Time 6   Period Weeks   Status On-going                  Plan - 11/24/15 1651    Clinical Impression Statement A: Provided education on brachialis muscle with HEP. Completed isometric exercises prior to strengthning. Initiated exercises with 10lb dumbell but patient was unable to complete more than 1 rep. Therapist decreased weight to 8lb and patient demonstrated increased success but was unable to finish set. Therapist decreased  weight to 5lbs and patient was successful in completing the sets.   Plan P: Continue with exercises added this session to isolate and strengthen bracialis muscle for increased use with daily tasks.        Problem List Patient Active Problem List   Diagnosis Date Noted  . Elevated TSH 09/18/2015  . Stented coronary artery   . Hyperglycemia 09/16/2015  . Hyperlipidemia LDL goal <70 09/16/2015  . Hypertension, malignant 09/15/2015  . ST elevation (STEMI) myocardial infarction involving right coronary artery (HCC) 09/15/2015    Essenmacher, Charisse March OTA student 11/24/2015, 5:03 PM  [place] [facility] [address] [place], [place], [address] Phone: [phone]   Fax:  [phone]  Name: Richard Hull MRN: [id number] Date of Birth: 1976-07-30  [name],  OTR/L,CBIS  [phone]  Note reviewed by clinical instructor and accurately reflects treatment session.

## 2015-11-24 NOTE — Patient Instructions (Signed)
The Brachialis - Often Neglected, And Limiting Factor in Arm Development [employer]  The brachialis is a relatively small elbow flexor that, when developed, adds a third dimension to one's upper arm development (along with the lateral head of the triceps), very much the same way a pair of developed rear deltoids give the shoulders a three dimensional appearance. When relaxed and viewed from the front, the brachialis ads thickness and width to the upper arm more so than any other local muscle (especially noticeable in bodybuilders in the front relaxed pose). Reigning Mr. Ivan Croft, and former [place] competitor and bodybuilding fan-favorite Nani Skillern, are perfect examples of this as they each display a pair of developed brachialis muscles that seemingly pop out of their arms in virtually every single pose.  Muscle Building Essentials To specifically target a muscle, with the goal of building it up, it's important to know a few critical pieces of information to provide direction when structuring a plan, and they are:  . Is the muscle fast twitch dominant, slow twitch dominant, or of a mixed fiber type? The answer to this should dictate the loading parameters used to build the muscle.  . What other muscles contribute to the same relative movement, and how can their contribution be limited, so by default the targeted muscle takes on the greatest percentage of the load? The answer to this should dictate how the body is positioned so that the synergistic muscle groups are limited in their contribution.  . What joint angles/ranges of motion place the targeted area under the greatest stress, and which place it under the least stress? The answer to this should dictate how the exercise is executed.  As it relates to the brachialis, in most cases it's predominantly fast twitch, and the primary muscles which contribute alongside it to create elbow flexion are both heads of the biceps, but they're  contribution can be limited by performing arm curl exercises with the elbow positioned in front of the body, or with a neutral/overhand grip. In terms of elbow flexion itself, the brachialis has a slightly better angle of pull in comparison to the biceps the more the elbow is extended (through the first 20-30 degrees of elbow flexion), after which the biceps take over as their angle of pull begins to improve. Therefore, the best way to prioritize the brachialis in most cases is with heavy weights, lower reps, higher sets to make up for the lack of volume per set, while performing movements with the elbows placed in front of the body, with the palms facing each other, or the floor, and by performing only the first half of the movement.  TIP: Regardless of whether the hand is supinated, pronated, or neutral when performing arm curls, pausing during the concentric phase of the lift between the beginning and middle phase (basically 90 degrees or less of elbow flexion) of the lift will increase the amount of work done by the brachialis, since this is the range in which it is most active.  The 15% Advantage Because the sum of the muscles responsible for elbow flexion is greatest when using a neutral grip, you should be able to lift roughly 15% more when performing a hammer curl per se, in comparison to using an underhand (supinated) grip. If this is not possible, it may suggest that there is a relative imbalance between the brachialis and the biceps, which can be further identified by comparing overhand (pronated) curl strength, to underhand (supinated) curl strength. If you  can't curl at least 80% with an overhand grip, as you can with an underhand grip, this would suggest that the brachialis is weak, and by correcting this imbalance through training, the potential for both strength and size gains is elevated.  TIP: If the elbows flare out when performing overhand curls, this indicates that the brachialis is  struggling to handle the load, and may be weak.   Exercises  Zottman Curls (Standing or Seated/Incline)   Hammer Curls (Standing or Seated/Incline)   Cross-Body Hammer Curls (leaning in, performed uni-laterally by curling the dumbell across the body towards the opposite shoulder)   Reverse-Grip Standing Barbell Curl   Single-Arm Overhand Curl _

## 2015-11-29 ENCOUNTER — Encounter (HOSPITAL_COMMUNITY): Payer: Self-pay

## 2015-11-29 ENCOUNTER — Ambulatory Visit (HOSPITAL_COMMUNITY): Payer: BLUE CROSS/BLUE SHIELD

## 2015-11-29 DIAGNOSIS — R29898 Other symptoms and signs involving the musculoskeletal system: Secondary | ICD-10-CM

## 2015-11-29 DIAGNOSIS — M6281 Muscle weakness (generalized): Secondary | ICD-10-CM | POA: Diagnosis not present

## 2015-11-29 NOTE — Therapy (Signed)
[place] [facility] [address] [place], [place], [address] Phone: [phone]   Fax:  [phone]  Occupational Therapy Treatment  Patient Details  Name: Richard Hull MRN: [id number] Date of Birth: 12/27/1975 Referring Provider: Dr. [name]  Encounter Date: 11/29/2015      OT End of Session - 11/29/15 1612    Visit Number 8   Number of Visits 12   Date for OT Re-Evaluation 12/17/15  Mini reassess 12/02/15   Authorization Type BCBS    Authorization Time Period 60 visit limit-OT/PT/SLP   Authorization - Visit Number 8   Authorization - Number of Visits 60   OT Start Time 1515   OT Stop Time 1600   OT Time Calculation (min) 45 min   Activity Tolerance Patient tolerated treatment well   Behavior During Therapy [facility] for tasks assessed/performed      Past Medical History  Diagnosis Date  . Hypertension   . Coronary artery disease 08/2015  . Myocardial infarction (HCC) 08/2015  . Hyperlipidemia LDL goal <70   . Hypertriglyceridemia     Past Surgical History  Procedure Laterality Date  . Tonsillectomy    . Cardiac catheterization N/A 09/15/2015    Procedure: Left Heart Cath and Coronary Angiography;  Surgeon: Lennette Bihari, MD; LAD 30%, pRCA 30%, dRCA 30%, RPDA 99%, nl LV function   . Cardiac catheterization N/A 09/15/2015    Procedure: Coronary Stent Intervention;  Surgeon: Lennette Bihari, MD; 3.515 mm Xience Alpine DES to dRCA/RPDA>> 5% residual       There were no vitals filed for this visit.          [facility] OT Assessment - 11/29/15 1520    Assessment   Diagnosis left arm weakness   Precautions   Precautions None                  OT Treatments/Exercises (OP) - 11/29/15 1519    Shoulder Exercises: Seated   Other Seated Exercises Bicep curls with elbow on table 10X 2 sets   Shoulder Exercises: ROM/Strengthening   Other ROM/Strengthening Exercises Shoulder abduction with elbow flexed at 90 degrees 10X   Other ROM/Strengthening Exercises 10 pushups with elbows in modified on knees.   Modalities   Modalities Immunologist Parameters 40 MaCC   Electrical Stimulation Goals Neuromuscular facilitation                  OT Short Term Goals - 11/08/15 1436    OT SHORT TERM GOAL #1   Title Pt will be educated on and independent in HEP.    Time 6   Period Weeks   Status On-going   OT SHORT TERM GOAL #2   Title Pt will return to prior level of functioning and independence in ADL and work tasks.    Time 6   Period Weeks   Status On-going   OT SHORT TERM GOAL #3   Title Pt will decrease fascial restrictions in LUE from mod to min amounts or less to increase LUE mobility.    Time 6   Period Weeks   Status On-going   OT SHORT TERM GOAL #4   Title Pt will decrease pain in LUE to 2/10 or less during daily tasks.    Time 6   Period Weeks   Status On-going   OT SHORT TERM  GOAL #5   Title Pt will increase LUE A/ROM to WNL to increase ability to reach overhead shelves at home and work.    Time 6   Period Weeks   Status On-going   OT SHORT TERM GOAL #6   Title Pt will increase LUE strength to 4+/5 to increase ability to lift weighted containers and boxes at work.    Time 6   Period Weeks   Status On-going                  Plan - 11/29/15 1612    Clinical Impression Statement A: Applied ES on [place] setting to facilitate bicep muscle contraction for seated bicep curls with 10lb weight. Introduced pushups with elbows in this session with good success.    Plan P: Complete manual therapy PRN. Continue with pushups. Add Cybex row and press as well as Cybex tower exercises.       Patient will benefit from skilled therapeutic intervention in order to improve the following deficits and impairments:     Visit Diagnosis: Other symptoms  and signs involving the musculoskeletal system    Problem List Patient Active Problem List   Diagnosis Date Noted  . Elevated TSH 09/18/2015  . Stented coronary artery   . Hyperglycemia 09/16/2015  . Hyperlipidemia LDL goal <70 09/16/2015  . Hypertension, malignant 09/15/2015  . ST elevation (STEMI) myocardial infarction involving right coronary artery (HCC) 09/15/2015    Herbert Moors OTA student 11/29/2015, 4:16 PM  [place] [facility] [address] [place], [place], [address] Phone: [phone]   Fax:  [phone]  Name: Richard Hull MRN: [id number] Date of Birth: [birth year]  [name], OTR/L,CBIS  [phone]  Note reviewed by clinical instructor and accurately reflects treatment session.

## 2015-12-01 ENCOUNTER — Ambulatory Visit (HOSPITAL_COMMUNITY): Payer: BLUE CROSS/BLUE SHIELD

## 2015-12-01 ENCOUNTER — Encounter (HOSPITAL_COMMUNITY): Payer: Self-pay

## 2015-12-01 DIAGNOSIS — M25512 Pain in left shoulder: Secondary | ICD-10-CM

## 2015-12-01 DIAGNOSIS — M25612 Stiffness of left shoulder, not elsewhere classified: Secondary | ICD-10-CM

## 2015-12-01 DIAGNOSIS — M25522 Pain in left elbow: Secondary | ICD-10-CM

## 2015-12-01 DIAGNOSIS — R29898 Other symptoms and signs involving the musculoskeletal system: Secondary | ICD-10-CM

## 2015-12-01 DIAGNOSIS — M6281 Muscle weakness (generalized): Secondary | ICD-10-CM | POA: Diagnosis not present

## 2015-12-01 NOTE — Therapy (Addendum)
[place], [place], [address] Phone: [phone]   Fax:  [phone]  Occupational Therapy Treatment  Patient Details  Name: Richard Hull MRN: [id number] Date of Birth: 1976/02/15 Referring Provider: Dr. Shon Baton  Encounter Date: 12/01/2015      OT End of Session - 12/01/15 1559    Visit Number 9   Number of Visits 12   Date for OT Re-Evaluation 12/17/15   Authorization Type BCBS    Authorization Time Period 7 visit limit-OT/PT/SLP   Authorization - Visit Number 9   Authorization - Number of Visits 60   OT Start Time 1520   OT Stop Time 1600   OT Time Calculation (min) 40 min   Activity Tolerance Patient tolerated treatment well   Behavior During Therapy [facility] for tasks assessed/performed      Past Medical History  Diagnosis Date  . Hypertension   . Coronary artery disease 08/2015  . Myocardial infarction ([place]) 08/2015  . Hyperlipidemia LDL goal <70   . Hypertriglyceridemia     Past Surgical History  Procedure Laterality Date  . Tonsillectomy    . Cardiac catheterization N/A 09/15/2015    Procedure: Left Heart Cath and Coronary Angiography;  Surgeon: Troy Sine, MD; LAD 30%, pRCA 30%, dRCA 30%, RPDA 99%, nl LV function   . Cardiac catheterization N/A 09/15/2015    Procedure: Coronary Stent Intervention;  Surgeon: Troy Sine, MD; 3.515 mm Xience Alpine DES to dRCA/RPDA>> 5% residual       There were no vitals filed for this visit.      Subjective Assessment - 12/01/15 1516    Subjective  S: I've beensitting most of the day, that's when it (the pain) happens.   Currently in Pain? Yes   Pain Score 2    Pain Location Shoulder   Pain Orientation Left   Pain Descriptors / Indicators Aching   Pain Type Acute pain   Pain Onset Today   Pain Frequency Constant   Aggravating Factors  Sitting   Pain Relieving Factors Movement/stretching   Effect of Pain on Daily Activities Limited ability  to use Lue during work tasks.            [facility] OT Assessment - 12/01/15 1521    Assessment   Diagnosis left arm weakness   Precautions   Precautions None   Sensation   Semmes Weinstein Monofilament Scale Normal  Patient cannot feel medial anterior aspect of wrist   AROM   AROM Assessment Site Shoulder  assessed seated, IR/er assessed with shoulder adducted   Right/Left Shoulder Left   Left Shoulder Flexion 180 Degrees  previous 162   Left Shoulder ABduction 160 Degrees  previous 148   Left Shoulder Internal Rotation 90 Degrees  previous 90   Left Shoulder External Rotation 75 Degrees  previous 60   Strength   Overall Strength Comments Assessed seated, er/IR adducted   Strength Assessment Site Shoulder;Hand;Elbow;Forearm   Right/Left Shoulder Left   Left Shoulder Flexion 4-/5   Left Shoulder ABduction 4-/5   Left Shoulder Internal Rotation 5/5   Left Shoulder External Rotation 4/5   Right/Left Elbow Left   Left Elbow Flexion 5/5   Left Elbow Extension 5/5   Right/Left hand Right;Left   Right Hand Grip (lbs) 130   Left Hand Grip (lbs) 80                  OT Treatments/Exercises (OP) - 12/01/15  1659    Exercises   Exercises Shoulder;Elbow   Shoulder Exercises: ROM/Strengthening   Cybex Press 10 reps  7 plate   Cybex Row 10 reps  5 plate                OT Education - 12/01/15 1653    Education provided Yes   Education Details Reviewed HEP, discontinued some exercises due to progression of program. Discussed following up with doctor to see if EMG or nerve conduction test would be appropriate due to therapy not increasing strength in brachialis muscle.   Person(s) Educated Patient   Methods Explanation   Comprehension Verbalized understanding          OT Short Term Goals - 12/01/15 1538    OT SHORT TERM GOAL #1   Title Pt will be educated on and independent in HEP.    Time 6   Period Weeks   Status Achieved   OT SHORT TERM GOAL #2    Title Pt will return to prior level of functioning and independence in ADL and work tasks.    Time 6   Period Weeks   Status On-going   OT SHORT TERM GOAL #3   Title Pt will decrease fascial restrictions in LUE from mod to min amounts or less to increase LUE mobility.    Time 6   Period Weeks   Status Achieved   OT SHORT TERM GOAL #4   Title Pt will decrease pain in LUE to 2/10 or less during daily tasks.    Time 6   Period Weeks   Status On-going   OT SHORT TERM GOAL #5   Title Pt will increase LUE A/ROM to WNL to increase ability to reach overhead shelves at home and work.    Time 6   Period Weeks   Status Achieved   OT SHORT TERM GOAL #6   Title Pt will increase LUE strength to 4+/5 to increase ability to lift weighted containers and boxes at work.    Time 6   Period Weeks   Status On-going                  Plan - 12/01/15 1609    Clinical Impression Statement A: Mini reassessment this date. Patient has increased ROM to WNL and met 3/6 STGs. Therapist suggests asking doctor for EMG and nerve conduction test because therapy has increased ROM and decreased pain but has not had an effect on his strength.   Plan P: Complete manual therapy PRN. Continue with pushups and Cybex row/press exercises. Introduce Cybex tower.      Patient will benefit from skilled therapeutic intervention in order to improve the following deficits and impairments:  Decreased strength, Pain, Impaired UE functional use, Decreased range of motion, Increased fascial restricitons, Impaired flexibility  Visit Diagnosis: Other symptoms and signs involving the musculoskeletal system  Pain in left elbow  Shoulder stiffness, left  Pain in left shoulder    Problem List Patient Active Problem List   Diagnosis Date Noted  . Elevated TSH 09/18/2015  . Stented coronary artery   . Hyperglycemia 09/16/2015  . Hyperlipidemia LDL goal <70 09/16/2015  . Hypertension, malignant 09/15/2015  . ST  elevation (STEMI) myocardial infarction involving right coronary artery ([place]) 09/15/2015    Marijo Conception OTA student 12/01/2015, 5:06 PM  [place] [address] [place], [place], [address] Phone: [phone]   Fax:  [phone]  Name: Richard Hull  MRN: [id number] Date of Birth: 1976-03-11  Ailene Ravel, OTR/L,CBIS  [phone]  Note reviewed by clinical instructor and accurately reflects treatment session.

## 2015-12-06 ENCOUNTER — Encounter (HOSPITAL_COMMUNITY): Payer: Self-pay

## 2015-12-06 ENCOUNTER — Ambulatory Visit (HOSPITAL_COMMUNITY): Payer: BLUE CROSS/BLUE SHIELD

## 2015-12-06 DIAGNOSIS — M6281 Muscle weakness (generalized): Secondary | ICD-10-CM | POA: Diagnosis not present

## 2015-12-06 DIAGNOSIS — M25522 Pain in left elbow: Secondary | ICD-10-CM

## 2015-12-06 DIAGNOSIS — M25512 Pain in left shoulder: Secondary | ICD-10-CM

## 2015-12-06 DIAGNOSIS — R29898 Other symptoms and signs involving the musculoskeletal system: Secondary | ICD-10-CM

## 2015-12-06 DIAGNOSIS — M25612 Stiffness of left shoulder, not elsewhere classified: Secondary | ICD-10-CM

## 2015-12-06 NOTE — Therapy (Signed)
[facility] [address] [place], [place], [address] Phone: [phone]   Fax:  [phone]  Occupational Therapy Treatment  Patient Details  Name: Richard Hull MRN: [id number] Date of Birth: 1976/01/18 Referring Provider: Dr. [name]  Encounter Date: 12/06/2015      OT End of Session - 12/06/15 1611    Visit Number 10   Number of Visits 12   Date for OT Re-Evaluation 12/17/15   Authorization Type BCBS    Authorization Time Period 60 visit limit-OT/PT/SLP   Authorization - Visit Number 10   Authorization - Number of Visits 60   OT Start Time 1510   OT Stop Time 1600   OT Time Calculation (min) 50 min   Activity Tolerance Patient tolerated treatment well   Behavior During Therapy [facility] for tasks assessed/performed      Past Medical History  Diagnosis Date  . Hypertension   . Coronary artery disease 08/2015  . Myocardial infarction (HCC) 08/2015  . Hyperlipidemia LDL goal <70   . Hypertriglyceridemia     Past Surgical History  Procedure Laterality Date  . Tonsillectomy    . Cardiac catheterization N/A 09/15/2015    Procedure: Left Heart Cath and Coronary Angiography;  Surgeon: Lennette Bihari, MD; LAD 30%, pRCA 30%, dRCA 30%, RPDA 99%, nl LV function   . Cardiac catheterization N/A 09/15/2015    Procedure: Coronary Stent Intervention;  Surgeon: Lennette Bihari, MD; 3.515 mm Xience Alpine DES to dRCA/RPDA>> 5% residual       There were no vitals filed for this visit.      Subjective Assessment - 12/06/15 1511    Subjective  S: I was fine until I sat here just now. I'll be at work until [employer].   Currently in Pain? No/denies                      OT Treatments/Exercises (OP) - 12/06/15 1522    Exercises   Exercises Shoulder;Elbow   Shoulder Exercises: ROM/Strengthening   Cybex Press 15 reps;25 reps;20 reps  6 plate   Cybex Row 20 reps;25 reps;15 reps  7 plate   Other ROM/Strengthening  Exercises 10 pushups with elbows in modified on knees.  3 sets   Manual Therapy   Manual Therapy Myofascial release   Manual therapy comments Manual therapy completed prior to exercises.   Myofascial Release Myofascial release and manual stretching to left upper arm, trapezius, and scapularis region to decrease fascial restrictions and increase joint mobility in a pain free zone.                     OT Short Term Goals - 12/01/15 1538    OT SHORT TERM GOAL #1   Title Pt will be educated on and independent in HEP.    Time 6   Period Weeks   Status On going   OT SHORT TERM GOAL #2   Title Pt will return to prior level of functioning and independence in ADL and work tasks.    Time 6   Period Weeks   Status On-going   OT SHORT TERM GOAL #3   Title Pt will decrease fascial restrictions in LUE from mod to min amounts or less to increase LUE mobility.    Time 6   Period Weeks   Status On-Going   OT SHORT TERM GOAL #4   Title Pt will decrease pain in LUE to 2/10  or less during daily tasks.    Time 6   Period Weeks   Status On-going   OT SHORT TERM GOAL #5   Title Pt will increase LUE A/ROM to WNL to increase ability to reach overhead shelves at home and work.    Time 6   Period Weeks   Status On-Going   OT SHORT TERM GOAL #6   Title Pt will increase LUE strength to 4+/5 to increase ability to lift weighted containers and boxes at work.    Time 6   Period Weeks   Status On-going                  Plan - 12/06/15 1612    Clinical Impression Statement A: Completed manual therapy at end of session to derease fascial restrictions caused by exercise with good results. Patient completed reps on Cybex press and row as well as pushups this session. Patient was able to complete more pushups this date than last session.   Plan P: Complete manual therapy at end of session. Continue with pushups and Cybex row/press exercises. Introduce Cybex tower if possible.       Patient will benefit from skilled therapeutic intervention in order to improve the following deficits and impairments:  Decreased strength, Pain, Impaired UE functional use, Decreased range of motion, Increased fascial restricitons, Impaired flexibility  Visit Diagnosis: Other symptoms and signs involving the musculoskeletal system  Pain in left elbow  Shoulder stiffness, left  Pain in left shoulder    Problem List Patient Active Problem List   Diagnosis Date Noted  . Elevated TSH 09/18/2015  . Stented coronary artery   . Hyperglycemia 09/16/2015  . Hyperlipidemia LDL goal <70 09/16/2015  . Hypertension, malignant 09/15/2015  . ST elevation (STEMI) myocardial infarction involving right coronary artery (HCC) 09/15/2015    Herbert Moors OTA student 12/06/2015, 4:14 PM  [place] [facility] [address] [place], [place], [address] Phone: [phone]   Fax:  [phone]  Name: Richard Hull MRN: [id number] Date of Birth: October 07, 1975  [name], OTR/L,CBIS  [phone] Note reviewed by clinical instructor and accurately reflects treatment session.

## 2015-12-08 ENCOUNTER — Ambulatory Visit (HOSPITAL_COMMUNITY): Payer: BLUE CROSS/BLUE SHIELD

## 2015-12-08 ENCOUNTER — Encounter (HOSPITAL_COMMUNITY): Payer: Self-pay

## 2015-12-08 DIAGNOSIS — M25512 Pain in left shoulder: Secondary | ICD-10-CM

## 2015-12-08 DIAGNOSIS — M6281 Muscle weakness (generalized): Secondary | ICD-10-CM | POA: Diagnosis not present

## 2015-12-08 DIAGNOSIS — M25522 Pain in left elbow: Secondary | ICD-10-CM

## 2015-12-08 DIAGNOSIS — R29898 Other symptoms and signs involving the musculoskeletal system: Secondary | ICD-10-CM

## 2015-12-08 DIAGNOSIS — M25612 Stiffness of left shoulder, not elsewhere classified: Secondary | ICD-10-CM

## 2015-12-08 NOTE — Therapy (Signed)
[facility] [address] [place], [place], [address] Phone: [phone]   Fax:  [phone]  Occupational Therapy Treatment  Patient Details  Name: Richard Hull MRN: [id number] Date of Birth: [birth year] Referring Provider: Dr. [name]  Encounter Date: 12/08/2015      OT End of Session - 12/08/15 1652    Visit Number 11   Number of Visits 13   Date for OT Re-Evaluation 12/17/15   Authorization Type BCBS    Authorization Time Period 60 visit limit-OT/PT/SLP   Authorization - Visit Number 11   Authorization - Number of Visits 60   OT Start Time 1515   OT Stop Time 1600   OT Time Calculation (min) 45 min   Activity Tolerance Patient tolerated treatment well   Behavior During Therapy [facility] for tasks assessed/performed      Past Medical History  Diagnosis Date  . Hypertension   . Coronary artery disease 08/2015  . Myocardial infarction (HCC) 08/2015  . Hyperlipidemia LDL goal <70   . Hypertriglyceridemia     Past Surgical History  Procedure Laterality Date  . Tonsillectomy    . Cardiac catheterization N/A 09/15/2015    Procedure: Left Heart Cath and Coronary Angiography;  Surgeon: Lennette Bihari, MD; LAD 30%, pRCA 30%, dRCA 30%, RPDA 99%, nl LV function   . Cardiac catheterization N/A 09/15/2015    Procedure: Coronary Stent Intervention;  Surgeon: Lennette Bihari, MD; 3.515 mm Xience Alpine DES to dRCA/RPDA>> 5% residual       There were no vitals filed for this visit.      Subjective Assessment - 12/08/15 1646    Subjective  S: I am in so much pain from my last session, my whole body hurts.   Currently in Pain? Yes   Pain Score 3    Pain Location Shoulder   Pain Orientation Left   Pain Descriptors / Indicators Aching;Sore   Pain Type Acute pain   Pain Onset In the past 7 days   Pain Frequency Intermittent   Aggravating Factors  Overuse   Pain Relieving Factors Resting it   Effect of Pain on Daily Activities  Limited ability to use LUE during work tasks.   Multiple Pain Sites No                      OT Treatments/Exercises (OP) - 12/08/15 0001    Exercises   Exercises Shoulder;Elbow   Shoulder Exercises: Standing   Row Strengthening;15 reps   Row Weight (lbs) 5   Other Standing Exercises Patient completes the following exercises with a 5# dumbell: 25 hammer curls, 9 cross body hammer curls, 27 reverse grip hammer curls, 13 Zottman curls, 13 1/2 hammer curls, and 8 1/2 hammer curls from 90 degrees of elbow flexion.   Shoulder Exercises: ROM/Strengthening   Pushups Other (comment)  17   Pushups Limitations on counter   "W" Arms 10X   Anterior Glide 10 second hold 3X   Shoulder Exercises: Stretch   Internal Rotation Stretch 3 reps  10 seconds   Elbow Exercises   Forearm Supination Strengthening;15 reps  5lb   Forearm Pronation Strengthening;15 reps  5lb   Manual Therapy   Manual Therapy Myofascial release   Manual therapy comments Manual therapy completed prior to exercises.   Myofascial Release Myofascial release and manual stretching to left upper arm, trapezius, and scapularis region to decrease fascial restrictions and increase joint mobility in a pain  free zone.  added manual cervical traction and myofascial release to cervical region.                    OT Short Term Goals - 12/01/15 1538    OT SHORT TERM GOAL #1   Title Pt will be educated on and independent in HEP.    Time 6   Period Weeks   Status Achieved   OT SHORT TERM GOAL #2   Title Pt will return to prior level of functioning and independence in ADL and work tasks.    Time 6   Period Weeks   Status On-going   OT SHORT TERM GOAL #3   Title Pt will decrease fascial restrictions in LUE from mod to min amounts or less to increase LUE mobility.    Time 6   Period Weeks   Status Achieved   OT SHORT TERM GOAL #4   Title Pt will decrease pain in LUE to 2/10 or less during daily tasks.    Time  6   Period Weeks   Status On-going   OT SHORT TERM GOAL #5   Title Pt will increase LUE A/ROM to WNL to increase ability to reach overhead shelves at home and work.    Time 6   Period Weeks   Status Achieved   OT SHORT TERM GOAL #6   Title Pt will increase LUE strength to 4+/5 to increase ability to lift weighted containers and boxes at work.    Time 6   Period Weeks   Status On-going                  Plan - 12/08/15 1652    Clinical Impression Statement A: Patient was able to complete dumbell exercises with decreased difficulty this session despite arriving to session with increased pain.   Plan P: Continue with manual therapy, pushups, Cybex row, and Cybex press if tolerated.       Patient will benefit from skilled therapeutic intervention in order to improve the following deficits and impairments:  Decreased strength, Pain, Impaired UE functional use, Decreased range of motion, Increased fascial restricitons, Impaired flexibility  Visit Diagnosis: Other symptoms and signs involving the musculoskeletal system  Pain in left elbow  Shoulder stiffness, left  Pain in left shoulder    Problem List Patient Active Problem List   Diagnosis Date Noted  . Elevated TSH 09/18/2015  . Stented coronary artery   . Hyperglycemia 09/16/2015  . Hyperlipidemia LDL goal <70 09/16/2015  . Hypertension, malignant 09/15/2015  . ST elevation (STEMI) myocardial infarction involving right coronary artery (HCC) 09/15/2015    Richard Hull OTA student 12/08/2015, 4:56 PM  [facility] [address] [place], [place], [address] Phone: [phone]   Fax:  [phone]  Name: Richard Hull MRN: [id number] Date of Birth: Jan 13, 1976  Richard Hull, OTR/L,CBIS  [phone] Note reviewed by clinical instructor and accurately reflects treatment session.

## 2015-12-13 ENCOUNTER — Ambulatory Visit (HOSPITAL_COMMUNITY): Payer: BLUE CROSS/BLUE SHIELD

## 2015-12-13 ENCOUNTER — Encounter (HOSPITAL_COMMUNITY): Payer: Self-pay

## 2015-12-13 DIAGNOSIS — M6281 Muscle weakness (generalized): Secondary | ICD-10-CM | POA: Diagnosis not present

## 2015-12-13 DIAGNOSIS — R29898 Other symptoms and signs involving the musculoskeletal system: Secondary | ICD-10-CM

## 2015-12-13 DIAGNOSIS — M25512 Pain in left shoulder: Secondary | ICD-10-CM

## 2015-12-13 DIAGNOSIS — M25612 Stiffness of left shoulder, not elsewhere classified: Secondary | ICD-10-CM

## 2015-12-13 DIAGNOSIS — M25522 Pain in left elbow: Secondary | ICD-10-CM

## 2015-12-13 NOTE — Therapy (Signed)
[facility] [address] [place], [place], [address] Phone: [phone]   Fax:  [phone]  Occupational Therapy Treatment  Patient Details  Name: Richard Hull MRN: [id number] Date of Birth: [birth year] Referring Provider: Dr. [name]  Encounter Date: 12/13/2015      OT End of Session - 12/13/15 1704    Visit Number 12   Number of Visits 13   Date for OT Re-Evaluation 12/17/15   Authorization Type BCBS    Authorization Time Period 60 visit limit-OT/PT/SLP   Authorization - Visit Number 12   Authorization - Number of Visits 60   OT Start Time 1605   OT Stop Time 1650   OT Time Calculation (min) 45 min   Activity Tolerance Patient tolerated treatment well   Behavior During Therapy [facility] for tasks assessed/performed      Past Medical History  Diagnosis Date  . Hypertension   . Coronary artery disease 08/2015  . Myocardial infarction (HCC) 08/2015  . Hyperlipidemia LDL goal <70   . Hypertriglyceridemia     Past Surgical History  Procedure Laterality Date  . Tonsillectomy    . Cardiac catheterization N/A 09/15/2015    Procedure: Left Heart Cath and Coronary Angiography;  Surgeon: Lennette Bihari, MD; LAD 30%, pRCA 30%, dRCA 30%, RPDA 99%, nl LV function   . Cardiac catheterization N/A 09/15/2015    Procedure: Coronary Stent Intervention;  Surgeon: Lennette Bihari, MD; 3.515 mm Xience Alpine DES to dRCA/RPDA>> 5% residual       There were no vitals filed for this visit.      Subjective Assessment - 12/13/15 1608    Subjective  S: I am laregley pain free since the last time I was here.   Currently in Pain? No/denies                      OT Treatments/Exercises (OP) - 12/13/15 1609    Exercises   Exercises Shoulder;Elbow   Manual Therapy   Manual Therapy Myofascial release   Manual therapy comments Manual therapy completed prior to exercises.   Myofascial Release Myofascial release and manual stretching  to left upper arm, trapezius, and scapularis region to decrease fascial restrictions and increase joint mobility in a pain free zone.  added manual cervical traction and myofascial release to cervical region.                    OT Short Term Goals - 12/08/15 1704    OT SHORT TERM GOAL #1   Title Pt will be educated on and independent in HEP.    Time 6   Period Weeks   OT SHORT TERM GOAL #2   Title Pt will return to prior level of functioning and independence in ADL and work tasks.    Time 6   Period Weeks   Status On-going   OT SHORT TERM GOAL #3   Title Pt will decrease fascial restrictions in LUE from mod to min amounts or less to increase LUE mobility.    Time 6   Period Weeks   OT SHORT TERM GOAL #4   Title Pt will decrease pain in LUE to 2/10 or less during daily tasks.    Time 6   Period Weeks   Status On-going   OT SHORT TERM GOAL #5   Title Pt will increase LUE A/ROM to WNL to increase ability to reach overhead shelves at home and work.  Time 6   Period Weeks   OT SHORT TERM GOAL #6   Title Pt will increase LUE strength to 4+/5 to increase ability to lift weighted containers and boxes at work.    Time 6   Period Weeks   Status On-going                  Plan - 12/13/15 1704    Clinical Impression Statement A: Patient presents with max fascial restrictions in brachioradialis, bicep, and anterior deltoid. Focused treatment session on decreasing fascial restrictions and decreasing pain.   Plan P: Reassess. Continue with manual therapy, pushups, Cybex row and press, Cybex tower, and dumbell exercises.      Patient will benefit from skilled therapeutic intervention in order to improve the following deficits and impairments:  Decreased strength, Pain, Impaired UE functional use, Decreased range of motion, Increased fascial restricitons, Impaired flexibility  Visit Diagnosis: Other symptoms and signs involving the musculoskeletal system  Pain in  left elbow  Shoulder stiffness, left  Pain in left shoulder    Problem List Patient Active Problem List   Diagnosis Date Noted  . Elevated TSH 09/18/2015  . Stented coronary artery   . Hyperglycemia 09/16/2015  . Hyperlipidemia LDL goal <70 09/16/2015  . Hypertension, malignant 09/15/2015  . ST elevation (STEMI) myocardial infarction involving right coronary artery (HCC) 09/15/2015    Herbert Moors OTA student 12/13/2015, 5:12 PM  [facility] [address] [place], [place], [address] Phone: [phone]   Fax:  [phone]  Name: Richard Hull MRN: [id number] Date of Birth: May 07, 1976  Note reviewed by clinical instructor and accurately reflects treatment session.   [name], OTR/L,CBIS  [phone]

## 2015-12-15 ENCOUNTER — Ambulatory Visit (HOSPITAL_COMMUNITY): Payer: BLUE CROSS/BLUE SHIELD | Admitting: Occupational Therapy

## 2015-12-15 DIAGNOSIS — R29898 Other symptoms and signs involving the musculoskeletal system: Secondary | ICD-10-CM

## 2015-12-15 DIAGNOSIS — M6281 Muscle weakness (generalized): Secondary | ICD-10-CM | POA: Diagnosis not present

## 2015-12-15 NOTE — Therapy (Signed)
[place], [place], [address] Phone: [phone]   Fax:  [phone]  Occupational Therapy Reassessment and Treatment  Patient Details  Name: Richard Hull MRN: [id number] Date of Birth: [birth year] Referring Provider: Dr. Shon Baton  Encounter Date: 12/15/2015      OT End of Session - 12/15/15 1602    Visit Number 13   Number of Visits 13   Date for OT Re-Evaluation 12/17/15   Authorization Type BCBS    Authorization Time Period 50 visit limit-OT/PT/SLP   Authorization - Visit Number 6   Authorization - Number of Visits 27   OT Start Time 3785   OT Stop Time 1602   OT Time Calculation (min) 47 min   Activity Tolerance Patient tolerated treatment well   Behavior During Therapy [facility] for tasks assessed/performed      Past Medical History  Diagnosis Date  . Hypertension   . Coronary artery disease 08/2015  . Myocardial infarction ([place]) 08/2015  . Hyperlipidemia LDL goal <70   . Hypertriglyceridemia     Past Surgical History  Procedure Laterality Date  . Tonsillectomy    . Cardiac catheterization N/A 09/15/2015    Procedure: Left Heart Cath and Coronary Angiography;  Surgeon: Troy Sine, MD; LAD 30%, pRCA 30%, dRCA 30%, RPDA 99%, nl LV function   . Cardiac catheterization N/A 09/15/2015    Procedure: Coronary Stent Intervention;  Surgeon: Troy Sine, MD; 3.515 mm Xience Alpine DES to dRCA/RPDA>> 5% residual       There were no vitals filed for this visit.         [facility] OT Assessment - 12/15/15 1527    Assessment   Diagnosis left arm weakness   Precautions   Precautions None   Palpation   Palpation comment Pt has min fascial restrictions along elbow, upper arm, trapezius, serratus anterior, and scapularis regions   AROM   Overall AROM Comments Assessed in sitting, ER/IR adducted    AROM Assessment Site Shoulder   Left Shoulder Flexion 180 Degrees  same as previous   Left Shoulder  ABduction 180 Degrees  previous 160   Left Shoulder Internal Rotation 90 Degrees  same as previous   Left Shoulder External Rotation 80 Degrees  previous 75   Strength   Overall Strength Comments Assessed seated, ER/IR adducted   Strength Assessment Site Shoulder   Right/Left Shoulder Left   Left Shoulder Flexion 5/5  4-/5 previous   Left Shoulder ABduction 5/5  4-/5 previous   Left Shoulder Internal Rotation 5/5  same as previous   Left Shoulder External Rotation 5/5  4/5 previous   Right/Left Elbow Left   Left Elbow Flexion 5/5  same as previous   Left Elbow Extension 5/5  same as previous   Right/Left hand Right;Left   Left Hand Grip (lbs) 80  same as previous                  OT Treatments/Exercises (OP) - 12/15/15 1539    Exercises   Exercises Shoulder;Elbow   Shoulder Exercises: ROM/Strengthening   Cybex Press Other (comment)  8 plate 2 sets 12 reps   Cybex Row Other (comment)  6 plate 2 sets 12 reps   Pushups 15 reps  Countertop pushups   Other ROM/Strengthening Exercises Bicep curls with Cybex tower 4 plate 12X, 1/2 bicep curls with Cybex tower 12X 4 plate. Shoudler flexion with Cybex tower 3 plate 2 sets 88F.  Other ROM/Strengthening Exercises 7 pushups at 17f, 6 pushups with shoulders adducted   Manual Therapy   Manual Therapy Myofascial release   Manual therapy comments Manual therapy completed prior to exercises.   Myofascial Release Myofascial release and manual stretching to left upper arm, trapezius, and scapularis region to decrease fascial restrictions and increase joint mobility in a pain free zone.  added manual cervical traction and myofascial release to cervical region.                   OT Short Term Goals - 12/15/15 1534    OT SHORT TERM GOAL #1   Title Pt will be educated on and independent in HEP.    Time 6   Period Weeks   OT SHORT TERM GOAL #2   Title Pt will return to prior level of functioning and independence in  ADL and work tasks.    Time 6   Period Weeks   Status On-going   OT SHORT TERM GOAL #3   Title Pt will decrease fascial restrictions in LUE from mod to min amounts or less to increase LUE mobility.    Time 6   Period Weeks   OT SHORT TERM GOAL #4   Title Pt will decrease pain in LUE to 2/10 or less during daily tasks.    Time 6   Period Weeks   Status On-going   OT SHORT TERM GOAL #5   Title Pt will increase LUE A/ROM to WNL to increase ability to reach overhead shelves at home and work.    Time 6   Period Weeks   OT SHORT TERM GOAL #6   Title Pt will increase LUE strength to 4+/5 to increase ability to lift weighted containers and boxes at work.    Time 6   Period Weeks   Status Achieved                  Plan - 12/15/15 1602    Clinical Impression Statement A: Reassessment completed today, pt has met 5/6 goals with improvements in the areas of shoulder A/ROM and strength, as well as elbow strength. Pt has improved LUE strength, however continues to have deficits with sustained activity tolerance and strengthening tasks, as well as heavy strengthening activities (over 10#s) limiting ability to complete daily and work tasks.  Pt is pursuing nerve conduction testing in the near future and has opted to hold therapy for the immediate future and will let rehab staff know what study is scheduled and/or completed, as well as informing rehab staff if he will continue therapy services or discharge.    Rehab Potential Good   OT Frequency 2x / week   OT Duration 6 weeks   OT Treatment/Interventions Self-care/ADL training;DME and/or AE instruction;Passive range of motion;Patient/family education;Cryotherapy;Electrical Stimulation;Moist Heat;Therapeutic exercise;Manual Therapy;Therapeutic activities   Plan P: Hold therapy services until after nerve conduction study. Follow up with pt on resuming therapy or discharging   Consulted and Agree with Plan of Care Patient      Patient will  benefit from skilled therapeutic intervention in order to improve the following deficits and impairments:  Decreased strength, Pain, Impaired UE functional use, Decreased range of motion, Increased fascial restricitons, Impaired flexibility  Visit Diagnosis: Other symptoms and signs involving the musculoskeletal system  Muscle weakness (generalized)    Problem List Patient Active Problem List   Diagnosis Date Noted  . Elevated TSH 09/18/2015  . Stented coronary artery   . Hyperglycemia 09/16/2015  .  Hyperlipidemia LDL goal <70 09/16/2015  . Hypertension, malignant 09/15/2015  . ST elevation (STEMI) myocardial infarction involving right coronary artery [facility]) 09/15/2015    Guadelupe Sabin, OTR/L  [phone]  12/15/2015, 4:08 PM  [place] [address] [place], [place], [address] Phone: [phone]   Fax:  [phone]  Name: Richard Hull MRN: [id number] Date of Birth: Dec 21, 1975

## 2015-12-20 ENCOUNTER — Encounter (HOSPITAL_COMMUNITY): Payer: BLUE CROSS/BLUE SHIELD

## 2015-12-22 ENCOUNTER — Encounter (HOSPITAL_COMMUNITY): Payer: BLUE CROSS/BLUE SHIELD

## 2015-12-27 ENCOUNTER — Encounter (HOSPITAL_COMMUNITY): Payer: BLUE CROSS/BLUE SHIELD

## 2015-12-29 ENCOUNTER — Ambulatory Visit: Payer: BLUE CROSS/BLUE SHIELD | Admitting: Cardiovascular Disease

## 2015-12-29 ENCOUNTER — Encounter (HOSPITAL_COMMUNITY): Payer: BLUE CROSS/BLUE SHIELD

## 2016-01-09 ENCOUNTER — Encounter: Payer: Self-pay | Admitting: Cardiovascular Disease

## 2016-01-09 ENCOUNTER — Ambulatory Visit (INDEPENDENT_AMBULATORY_CARE_PROVIDER_SITE_OTHER): Payer: BLUE CROSS/BLUE SHIELD | Admitting: Cardiovascular Disease

## 2016-01-09 VITALS — BP 160/94 | HR 73 | Ht 69.0 in | Wt 235.4 lb

## 2016-01-09 DIAGNOSIS — I2111 ST elevation (STEMI) myocardial infarction involving right coronary artery: Secondary | ICD-10-CM

## 2016-01-09 DIAGNOSIS — E782 Mixed hyperlipidemia: Secondary | ICD-10-CM

## 2016-01-09 DIAGNOSIS — R0989 Other specified symptoms and signs involving the circulatory and respiratory systems: Secondary | ICD-10-CM

## 2016-01-09 DIAGNOSIS — E781 Pure hyperglyceridemia: Secondary | ICD-10-CM | POA: Insufficient documentation

## 2016-01-09 DIAGNOSIS — E785 Hyperlipidemia, unspecified: Secondary | ICD-10-CM

## 2016-01-09 DIAGNOSIS — G4730 Sleep apnea, unspecified: Secondary | ICD-10-CM

## 2016-01-09 DIAGNOSIS — I10 Essential (primary) hypertension: Secondary | ICD-10-CM | POA: Diagnosis not present

## 2016-01-09 DIAGNOSIS — G478 Other sleep disorders: Secondary | ICD-10-CM

## 2016-01-09 DIAGNOSIS — R9431 Abnormal electrocardiogram [ECG] [EKG]: Secondary | ICD-10-CM

## 2016-01-09 DIAGNOSIS — G4733 Obstructive sleep apnea (adult) (pediatric): Secondary | ICD-10-CM

## 2016-01-09 MED ORDER — AMLODIPINE BESYLATE 5 MG PO TABS: 5.0000 mg | ORAL_TABLET | Freq: Every day | ORAL | Status: DC

## 2016-01-09 MED ORDER — ICOSAPENT ETHYL 1 G PO CAPS: 2.0000 | ORAL_CAPSULE | Freq: Two times a day (BID) | ORAL | Status: DC

## 2016-01-09 NOTE — Progress Notes (Signed)
Patient ID: Richard Hull, male   DOB: Sep 25, 1975, 40 y.o.   MRN: [id number]    Primary M.D.: Dr. Shon Baton  HPI: Richard Hull is a 40 y.o. male who presents to the office today for a  follow up cardiology evaluation following his recent inferior ST segment elevation MI.  Richard Hull has a history of hyperlipidemia as well as hypertension.  On the morning of 09/15/2015.  He was awakened from sleep with severe substernal chest pressure.  He presented to [facility] hospital emergency room where his ECG showed sinus tachycardia with inferior ST segment elevation.  A code STEMI was activated.  He underwent emergent cardiac catheterization by me, which revealed an ulcerated plaque in a large large dominant RCA with distal embolization to the PDA.  Intervention was performed and he ultimately underwent stenting of his RCA proximal to the PDA with insertion of a 3.515 mm Xience Alpine DES stent postdilated 3.75 mm and PTCA of the subtotal mid PDA, which was small caliber and dilated with a 20 balloon.  He was found to have mild concomitant CAD with 30% narrowing in the mid to distal LAD and proximal 30% stenosis.  A subsequent echo done on 09/18/2015 showed an EF of 65-70% without wall motion abnormality.  There was grade 1 diastolic dysfunction.  He was seen for post hospital initial evaluation with Richard Hull.  He had developed a dry cough, which was felt to be due to lisinopril, but this ultimately resolved and he has been maintained on lisinopril 40 mg daily, metoprolol 50 mg twice a day, atorvastatin 80 mg with Zetia 10 mg.  He continues to be on dual antiplatelet therapy with aspirin and Brilinta.  He has been taking over-the-counter fish oil.  He has a history of markedly riglycerides and was as high as 1200 several years ago.  He denies any recurrent episodes of chest pain.  Upon further questioning, the patient does snore.  Sleep is nonrestorative.  At times he awakens noticing his pulse is  going fast.  He presents for evaluation.   Past Medical History  Diagnosis Date  . Hypertension   . Coronary artery disease 08/2015  . Myocardial infarction ([place]) 08/2015  . Hyperlipidemia LDL goal <70   . Hypertriglyceridemia     Past Surgical History  Procedure Laterality Date  . Tonsillectomy    . Cardiac catheterization N/A 09/15/2015    Procedure: Left Heart Cath and Coronary Angiography;  Surgeon: Troy Sine, MD; LAD 30%, pRCA 30%, dRCA 30%, RPDA 99%, nl LV function   . Cardiac catheterization N/A 09/15/2015    Procedure: Coronary Stent Intervention;  Surgeon: Troy Sine, MD; 3.515 mm Xience Alpine DES to dRCA/RPDA>> 5% residual       Allergies  Allergen Reactions  . Amoxicillin Rash  . Penicillins Rash    Current Outpatient Prescriptions  Medication Sig Dispense Refill  . aspirin EC 81 MG EC tablet Take 1 tablet (81 mg total) by mouth daily.    Marland Kitchen atorvastatin (LIPITOR) 80 MG tablet Take 1 tablet (80 mg total) by mouth daily at 6 PM. 30 tablet 11  . ezetimibe (ZETIA) 10 MG tablet Take 10 mg by mouth daily.    . metoprolol (LOPRESSOR) 50 MG tablet Take 1 tablet (50 mg total) by mouth 2 (two) times daily. 60 tablet 11  . nitroGLYCERIN (NITROSTAT) 0.4 MG SL tablet Place 1 tablet (0.4 mg total) under the tongue every 5 (five) minutes as needed for chest  pain (CP or SOB). 25 tablet 12  . ticagrelor (BRILINTA) 90 MG TABS tablet Take 1 tablet (90 mg total) by mouth 2 (two) times daily. 60 tablet 11  . vitamin C (ASCORBIC ACID) 500 MG tablet Take 500 mg by mouth 2 (two) times daily.    Marland Kitchen amLODipine (NORVASC) 5 MG tablet Take 1 tablet (5 mg total) by mouth daily. 90 tablet 3  . Icosapent Ethyl (VASCEPA) 1 g CAPS Take 2 capsules by mouth 2 (two) times daily. 360 capsule 3  . lisinopril (PRINIVIL,ZESTRIL) 40 MG tablet Take 40 mg by mouth daily.  11   No current facility-administered medications for this visit.    Social History   Social History  . Marital Status:  Single    Spouse Name: N/A  . Number of Children: N/A  . Years of Education: N/A   Occupational History  .  Food [employer]   Social History Main Topics  . Smoking status: Never Smoker   . Smokeless tobacco: Not on file  . Alcohol Use: No  . Drug Use: No  . Sexual Activity: Yes   Other Topics Concern  . Not on file   Social History Narrative   Multiple family members on his father's side have/had CAD at a young age, uncles and grandparents.   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  He is married and has 3 children  Family History  Problem Relation Age of Onset  . Heart attack Father 63    approx age of first stent  . Stroke Paternal Grandmother   . Stroke Father   . Hypertension Father   . Hypertension Maternal Grandmother   . Hypertension Maternal Grandfather   . Hypertension Paternal Grandmother   . Hypertension Paternal Grandfather     ROS General: Negative; No fevers, chills, or night sweats HEENT: Negative; No changes in vision or hearing, sinus congestion, difficulty swallowing Pulmonary: Negative; No cough, wheezing, shortness of breath, hemoptysis Cardiovascular: See HPI: No chest pain, presyncope, syncope, palpatations GI: Negative; No nausea, vomiting, diarrhea, or abdominal pain GU: Negative; No dysuria, hematuria, or difficulty voiding Musculoskeletal: Negative; no myalgias, joint pain, or weakness Hematologic: Negative; no easy bruising, bleeding Endocrine: Negative; no heat/cold intolerance; no diabetes, Neuro: Negative; no changes in balance, headaches Skin: Negative; No rashes or skin lesions Psychiatric: Negative; No behavioral problems, depression Sleep: Positive for snoring, and occasional daytime sleepiness, no bruxism, restless legs, hypnogognic hallucinations. Other comprehensive 14 point system review is negative   Physical Exam BP 160/94 mmHg  Pulse 73  Ht 5' 9"  (1.753 m)  Wt 235 lb 6.4 oz (106.777 kg)   BMI 34.75 kg/m2  SpO2 98%   Repeat blood pressure by me 160/90  Wt Readings from Last 3 Encounters:  01/09/16 235 lb 6.4 oz (106.777 kg)  09/27/15 231 lb (104.781 kg)  09/18/15 229 lb 15 oz (104.3 kg)   General: Alert, oriented, no distress.  Skin: normal turgor, no rashes, warm and dry HEENT: Normocephalic, atraumatic. Pupils equal round and reactive to light; sclera anicteric; extraocular muscles intact, No lid lag; Nose without nasal septal hypertrophy; Mouth/Parynx benign; Mallinpatti scale 3 Neck: No JVD, no carotid bruits; normal carotid upstroke Lungs: clear to ausculatation and percussion bilaterally; no wheezing or rales, normal inspiratory and expiratory effort Chest wall: without tenderness to palpitation Heart: PMI not displaced, RRR, s1 s2 normal, Faint 1/6 systolic murmur, No diastolic murmur, no rubs, gallops, thrills, or heaves Abdomen: soft, nontender;  no hepatosplenomehaly, BS+; abdominal aorta nontender and not dilated by palpation. Back: no CVA tenderness Pulses: 2+ ;right wrist catheterization site stable Musculoskeletal: full range of motion, normal strength, no joint deformities Extremities: Pulses 2+, no clubbing cyanosis or edema, Homan's sign negative  Neurologic: grossly nonfocal; Cranial nerves grossly wnl Psychologic: Normal mood and affect   ECG (independently read by me): Normal sinus rhythm at 73 bpm.  Small nondiagnostic inferior Q waves with T-wave inversion.  LABS:  BMP Latest Ref Rng 09/16/2015 09/15/2015 09/15/2015  Glucose 65 - 99 mg/dL 133(H) 166(H) 144(H)  BUN 6 - 20 mg/dL 8 14 13   Creatinine 0.61 - 1.24 mg/dL 0.81 0.70 0.84  Sodium 135 - 145 mmol/L 137 139 140  Potassium 3.5 - 5.1 mmol/L 3.9 3.9 4.5  Chloride 101 - 111 mmol/L 104 105 101  CO2 22 - 32 mmol/L 20(L) - 25  Calcium 8.9 - 10.3 mg/dL 8.7(L) - 9.3     Hepatic Function Latest Ref Rng 09/15/2015 03/08/2014  Total Protein 6.5 - 8.1 g/dL 6.4(L) 7.1  Albumin 3.5 - 5.0 g/dL 4.0 4.5    AST 15 - 41 U/L 36 31  ALT 17 - 63 U/L 54 53  Alk Phosphatase 38 - 126 U/L 66 84  Total Bilirubin 0.3 - 1.2 mg/dL 0.8 0.3    CBC Latest Ref Rng 09/16/2015 09/15/2015 09/15/2015  WBC 4.0 - 10.5 K/uL 10.9(H) - 14.1(H)  Hemoglobin 13.0 - 17.0 g/dL 13.7 16.0 16.3  Hematocrit 39.0 - 52.0 % 40.8 47.0 48.6  Platelets 150 - 400 K/uL 223 - 249   Lab Results  Component Value Date   MCV 82.1 09/16/2015   MCV 81.8 09/15/2015   MCV 80.4 03/08/2014    Lab Results  Component Value Date   TSH 5.921* 09/17/2015    BNP No results found for: BNP  ProBNP No results found for: PROBNP   Lipid Panel     Component Value Date/Time   CHOL 216* 09/16/2015 0335   TRIG 635* 09/16/2015 0335   HDL 32* 09/16/2015 0335   CHOLHDL 6.8 09/16/2015 0335   VLDL UNABLE TO CALCULATE IF TRIGLYCERIDE OVER 400 mg/dL 09/16/2015 0335   LDLCALC UNABLE TO CALCULATE IF TRIGLYCERIDE OVER 400 mg/dL 09/16/2015 0335     RADIOLOGY: No results found.    ASSESSMENT AND PLAN: Richard Hull is a 40 year old male who had a history of previously untreated.  Marked hyperlipidemia, as well as hypertension.  He was awakened from sleep with chest pain and was found to have inferior ST elevation.  He underwent successful emergent catheterization and intervention to an ulcerated plaque.  His RCA proximal to the PDA takeoff and there was evidence for distal embolization of clot to the PDA.  He underwent successful stenting at the site of the ulcerated plaque and PTCA in the distal PDA.  I'm concerned that with his MI occurring in the middle of the night.  He may well have a component of sleep apnea which has been contributory to his symptomatology.  This may have contributed to increased inflammation with ultimate plaque rupture during potential hypoxemia.  His blood pressure today continues to be elevated and I am adding amlodipine 5 mg to his lisinopril 40 mg, metoprolol 50 mg twice a day.  I reviewed recent laboratory which was  done by [name] which demonstrated total cholesterol166, triglycerides 309, HDL 35 and LDL 69, on his present therapy. I am changing his over-the-counter fish oil to the Vascepa 2 capsules twice a  day for aggressive reduction of triglycerides, and he will continue taking atorvastatin 80 mg and Zetia 10 mg.  In 6-8 weeks I will check an NMR profile for more aggressive evaluation of his lipid status.  Due to my strong concerns for probable sleep apnea, I am scheduling him for a sleep study.  I discussed with him potential need for CPAP therapy.  I will see him in 2-3 months for reevaluation.  Time spent: 30 minutes  Troy Sine, MD, [facility]  01/09/2016 7:05 PM

## 2016-01-09 NOTE — Patient Instructions (Signed)
Your physician has recommended you make the following change in your medication:   1.) STOP the over the counter fish oil. This has been replaced with Vascepa.  2.) start new prescription for amlodipine 5 mg. This has been sent to your pharmacy.  Your physician has recommended that you have a sleep study. This test records several body functions during sleep, including: brain activity, eye movement, oxygen and carbon dioxide blood levels, heart rate and rhythm, breathing rate and rhythm, the flow of air through your mouth and nose, snoring, body muscle movements, and chest and belly movement. This will be done at [facility].  Your physician recommends that you return for lab work in: 2 MONTHS.  Your physician recommends that you schedule a follow-up appointment in: 2-3 months.

## 2016-01-25 ENCOUNTER — Ambulatory Visit (INDEPENDENT_AMBULATORY_CARE_PROVIDER_SITE_OTHER): Payer: Self-pay | Admitting: Neurology

## 2016-01-25 ENCOUNTER — Encounter: Payer: Self-pay | Admitting: Neurology

## 2016-01-25 ENCOUNTER — Ambulatory Visit (INDEPENDENT_AMBULATORY_CARE_PROVIDER_SITE_OTHER): Payer: BLUE CROSS/BLUE SHIELD | Admitting: Neurology

## 2016-01-25 DIAGNOSIS — M50121 Cervical disc disorder at C4-C5 level with radiculopathy: Secondary | ICD-10-CM

## 2016-01-25 DIAGNOSIS — R946 Abnormal results of thyroid function studies: Secondary | ICD-10-CM

## 2016-01-25 DIAGNOSIS — R7989 Other specified abnormal findings of blood chemistry: Secondary | ICD-10-CM

## 2016-01-25 DIAGNOSIS — M5010 Cervical disc disorder with radiculopathy, unspecified cervical region: Secondary | ICD-10-CM

## 2016-01-25 NOTE — Progress Notes (Signed)
Please refer to EMG and nerve conduction study procedure note. 

## 2016-01-25 NOTE — Progress Notes (Signed)
Spoke to pt and he agreed to keep previously scheduled appt.

## 2016-01-25 NOTE — Procedures (Signed)
     HISTORY:  Richard Hull is a 40 year old gentleman with a history of onset of some left arm fatigable weakness that began in January 2017. The patient has noted that he has difficulty lifting more than 10 pounds with the left arm. He has gone through physical therapy without benefit. He denies any significant discomfort in the neck or arm. He denies any numbness in the left arm.  NERVE CONDUCTION STUDIES:  Nerve conduction studies were performed on the left upper extremity. The distal motor latencies and motor amplitudes for the median and ulnar nerves were within normal limits. The F wave latencies and nerve conduction velocities for these nerves were also normal. The sensory latencies for the median and ulnar nerves were normal.   EMG STUDIES:  EMG study was performed on the left upper extremity:  The first dorsal interosseous muscle reveals 2 to 4 K units with full recruitment. No fibrillations or positive waves were noted. The abductor pollicis brevis muscle reveals 2 to 4 K units with full recruitment. No fibrillations or positive waves were noted. The extensor indicis proprius muscle reveals 1 to 3 K units with full recruitment. No fibrillations or positive waves were noted. The pronator teres muscle reveals 2 to 3 K units with full recruitment. No fibrillations or positive waves were noted. The biceps muscle reveals up to 6 K units with decreased recruitment. No fibrillations or positive waves were noted. The triceps muscle reveals 2 to 4 K units with full recruitment. No fibrillations or positive waves were noted. The anterior deltoid muscle reveals 2 to 5 K units with decreased recruitment. 1 plus fibrillations and positive waves were noted. The supraspinatus muscle reveals up to 7 K units with decreased recruitment. No fibrillations or positive waves were seen. The cervical paraspinal muscles were tested at 2 levels. No abnormalities of insertional activity were seen at the  lower level tested. One run of positive waves seen in the upper level. There was good relaxation.   IMPRESSION:  Nerve conduction studies done on the left upper extremity were within normal limits, no evidence of a neuropathy is seen. EMG evaluation of the left upper extremity is most consistent with a C5 radiculopathy with acute and chronic features.  Marlan Palau MD 01/25/2016 11:22 AM  [facility] [address] [place], [place] [address]  Phone [phone] Fax [phone]

## 2016-01-27 ENCOUNTER — Ambulatory Visit (INDEPENDENT_AMBULATORY_CARE_PROVIDER_SITE_OTHER): Payer: BLUE CROSS/BLUE SHIELD | Admitting: Neurology

## 2016-01-27 ENCOUNTER — Encounter: Payer: Self-pay | Admitting: Neurology

## 2016-01-27 VITALS — BP 144/93 | HR 74 | Ht 69.0 in | Wt 234.5 lb

## 2016-01-27 DIAGNOSIS — M5412 Radiculopathy, cervical region: Secondary | ICD-10-CM | POA: Diagnosis not present

## 2016-01-27 HISTORY — DX: Radiculopathy, cervical region: M54.12

## 2016-01-27 NOTE — Progress Notes (Signed)
Reason for visit: Arm weakness  Referring physician: Dr. [name] is a 40 y.o. male  History of present illness:  Mr. Richard Hull is a 40 year old right-handed white male with a history of onset of left arm weakness in January 2017. He has been relatively stable with the strength over the last couple months, he believes that physical therapy may have helped him some. The patient has not noted any weakness in the right arm. He denies any numbness of the extremities with exception that occasionally his right arm will get numb when he is operating a motor vehicle. The patient denies any discomfort in the neck or shoulders. Occasionally with heavy strenuous activities, he may get a shock sensation across the upper chest. He denies any balance issues or difficulty controlling the bowels or the bladder. He has full mobility of the neck without discomfort, he denies any headaches. The patient has undergone an EMG and nerve conduction study that shows acute and chronic changes in the left C5 distribution. Clinical examination has shown bilateral symmetric C5 distribution weakness in the arms.  Past Medical History  Diagnosis Date  . Hypertension   . Coronary artery disease 08/2015  . Myocardial infarction ([facility]) 08/2015  . Hyperlipidemia LDL goal <70   . Hypertriglyceridemia   . Left arm weakness   . Cervical radiculopathy at C5 01/27/2016    left    Past Surgical History  Procedure Laterality Date  . Tonsillectomy    . Cardiac catheterization N/A 09/15/2015    Procedure: Left Heart Cath and Coronary Angiography;  Surgeon: Lennette Bihari, MD; LAD 30%, pRCA 30%, dRCA 30%, RPDA 99%, nl LV function   . Cardiac catheterization N/A 09/15/2015    Procedure: Coronary Stent Intervention;  Surgeon: Lennette Bihari, MD; 3.515 mm Xience Alpine DES to dRCA/RPDA>> 5% residual       Family History  Problem Relation Age of Onset  . Heart attack Father 13    approx age of first stent  . Stroke  Father   . Hypertension Father   . Stroke Paternal Grandmother   . Hypertension Paternal Grandmother   . Hypertension Maternal Grandmother   . Hypertension Maternal Grandfather   . Diabetes Maternal Grandfather   . Hypertension Paternal Grandfather   . Diabetes Paternal Grandfather   . Heart disease Paternal Grandfather   . Skin cancer Mother   . Hyperlipidemia Sister     Social history:  reports that he has never smoked. He does not have any smokeless tobacco history on file. He reports that he drinks alcohol. He reports that he does not use illicit drugs.  Medications:  Prior to Admission medications   Medication Sig Start Date End Date Taking? Authorizing Provider  amLODipine (NORVASC) 5 MG tablet Take 1 tablet (5 mg total) by mouth daily. 01/09/16  Yes Lennette Bihari, MD  aspirin EC 81 MG EC tablet Take 1 tablet (81 mg total) by mouth daily. 09/18/15  Yes Dwana Melena, PA-C  atorvastatin (LIPITOR) 80 MG tablet Take 1 tablet (80 mg total) by mouth daily at 6 PM. 09/18/15  Yes Dwana Melena, PA-C  ezetimibe (ZETIA) 10 MG tablet Take 10 mg by mouth daily.   Yes Historical Provider, MD  Icosapent Ethyl (VASCEPA) 1 g CAPS Take 2 capsules by mouth 2 (two) times daily. 01/09/16  Yes Lennette Bihari, MD  lisinopril (PRINIVIL,ZESTRIL) 40 MG tablet Take 40 mg by mouth daily. 11/24/15  Yes Historical Provider, MD  metoprolol (LOPRESSOR) 50 MG tablet Take 1 tablet (50 mg total) by mouth 2 (two) times daily. 09/18/15  Yes Dwana Melena, PA-C  nitroGLYCERIN (NITROSTAT) 0.4 MG SL tablet Place 1 tablet (0.4 mg total) under the tongue every 5 (five) minutes as needed for chest pain (CP or SOB). 09/18/15  Yes Dwana Melena, PA-C  ticagrelor (BRILINTA) 90 MG TABS tablet Take 1 tablet (90 mg total) by mouth 2 (two) times daily. 09/18/15  Yes Dwana Melena, PA-C  vitamin C (ASCORBIC ACID) 500 MG tablet Take 500 mg by mouth 2 (two) times daily.   Yes Historical Provider, MD      Allergies  Allergen Reactions    . Amoxicillin Rash  . Penicillins Rash    ROS:  Out of a complete 14 system review of symptoms, the patient complains only of the following symptoms, and all other reviewed systems are negative.  Weight gain Shortness of breath Easy bruising Joint pain, achy muscles Moles Allergies, skin sensitivity Headache, weakness Depression, decreased energy, disinterest in activities Snoring, restless legs  Blood pressure 144/93, pulse 74, height 5\' 9"  (1.753 m), weight 234 lb 8 oz (106.369 kg).  Physical Exam  General: The patient is alert and cooperative at the time of the examination.  Eyes: Pupils are equal, round, and reactive to light. Discs are flat bilaterally.  Neck: The neck is supple, no carotid bruits are noted.  Respiratory: The respiratory examination is clear.  Cardiovascular: The cardiovascular examination reveals a regular rate and rhythm, no obvious murmurs or rubs are noted.  Neuromuscular: Range of movement cervical spine is full.  Skin: Extremities are without significant edema.  Neurologic Exam  Mental status: The patient is alert and oriented x 3 at the time of the examination. The patient has apparent normal recent and remote memory, with an apparently normal attention span and concentration ability.  Cranial nerves: Facial symmetry is present. There is good sensation of the face to pinprick and soft touch bilaterally. The strength of the facial muscles and the muscles to head turning and shoulder shrug are normal bilaterally. Speech is well enunciated, no aphasia or dysarthria is noted. Extraocular movements are full. Visual fields are full. The tongue is midline, and the patient has symmetric elevation of the soft palate. No obvious hearing deficits are noted.  Motor: The motor testing reveals 5 over 5 strength of all 4 extremities, with exception of bilaterally symmetric weakness with biceps, supination, external rotation of the arms, and with the deltoid  muscles with 4+/5 strength. Good symmetric motor tone is noted throughout.  Sensory: Sensory testing is intact to pinprick, soft touch, vibration sensation, and position sense on all 4 extremities. No evidence of extinction is noted.  Coordination: Cerebellar testing reveals good finger-nose-finger and heel-to-shin bilaterally.  Gait and station: Gait is normal. Tandem gait is normal. Romberg is negative. No drift is seen.  Reflexes: Deep tendon reflexes are symmetric and normal bilaterally, with exception of some decrease in biceps reflexes bilaterally. Toes are downgoing bilaterally.   Assessment/Plan:  1. Bilateral C5 distribution weakness  The patient has had relatively painless onset of bilateral arm weakness, EMG has shown clear evidence of a C5 radiculopathy on the left. The patient reports occasional shock sensations across the chest area, the patient will need to be evaluated for spinal cord compression at or around the C5 level. The patient is concerned that he may have Lyme disease, we will check blood work today, and set him up for MRI  of the cervical spine. I will contact him with the results of the study.  Marlan Palau MD 01/27/2016 1:56 PM  [facility] [address] [place], [place] [address]  Phone [phone] Fax [phone]

## 2016-01-28 ENCOUNTER — Other Ambulatory Visit: Payer: Self-pay | Admitting: Internal Medicine

## 2016-01-28 DIAGNOSIS — M5412 Radiculopathy, cervical region: Secondary | ICD-10-CM

## 2016-01-28 DIAGNOSIS — R29898 Other symptoms and signs involving the musculoskeletal system: Secondary | ICD-10-CM

## 2016-01-30 LAB — B. BURGDORFI ANTIBODIES

## 2016-02-01 ENCOUNTER — Encounter (HOSPITAL_COMMUNITY): Payer: Self-pay | Admitting: Occupational Therapy

## 2016-02-01 NOTE — Therapy (Signed)
[place], [place], [address] Phone: [phone]   Fax:  [phone]  February 01, 2016     Occupational Therapy Discharge Summary   Patient: Richard Hull MRN: [id number] Date of Birth: [birth year]  Diagnosis: No diagnosis found.  Referring Provider: Dr. Shon Baton  Visits from [facility] of Care: 13  OT Short Term Goals - 12/15/15 1534    OT SHORT TERM GOAL #1   Title Pt will be educated on and independent in HEP.    Time 6   Period Weeks   OT SHORT TERM GOAL #2   Title Pt will return to prior level of functioning and independence in ADL and work tasks.    Time 6   Period Weeks   Status Not Met   OT SHORT TERM GOAL #3   Title Pt will decrease fascial restrictions in LUE from mod to min amounts or less to increase LUE mobility.    Time 6   Period Weeks   OT SHORT TERM GOAL #4   Title Pt will decrease pain in LUE to 2/10 or less during daily tasks.    Time 6   Period Weeks   Status  Not Met   OT SHORT TERM GOAL #5   Title Pt will increase LUE A/ROM to WNL to increase ability to reach overhead shelves at home and work.    Time 6   Period Weeks   OT SHORT TERM GOAL #6   Title Pt will increase LUE strength to 4+/5 to increase ability to lift weighted containers and boxes at work.    Time 6   Period Weeks   Status Achieved            Current functional level related to goals / functional outcomes: See goals above. Pt has met 4/6 STGs and demonstrates improvements in pain, fascial restrictions, and strength of LUE. Pt reports improvements in strength during isolated lifting tasks, as well as slight improvement in activity tolerance when using LUE. Therapy was placed on hold on 12/15/15 until after nerve conduction study. Per chart review, nerve conduction study was normal, however pt continues to have weakness and a cervical spine MRI  has been ordered. Pt will need new referral if additional therapy is requested/ordered.    Remaining deficits: Pt continues to experience LUE weakness with sustained activity during ADL, work, and leisure tasks.    Education / Equipment: Pt provided with updated HEP including LUE strengthening exercises.   Plan: Patient agrees to discharge.  Patient goals were partially met. Patient is being discharged due to lack of progress.  ?????         Sincerely,  Guadelupe Sabin, OTR/L  [phone] 02/01/2016   [facility] [address] [place], [place], [address] Phone: [phone]   Fax:  [phone]  Patient: Richard Hull MRN: [id number] Date of Birth: 07/26/1976

## 2016-02-15 ENCOUNTER — Ambulatory Visit
Admission: RE | Admit: 2016-02-15 | Discharge: 2016-02-15 | Disposition: A | Discharge status: Home / Self Care | Payer: BLUE CROSS/BLUE SHIELD | Source: Ambulatory Visit | Attending: Internal Medicine | Admitting: Internal Medicine

## 2016-02-15 DIAGNOSIS — M5412 Radiculopathy, cervical region: Secondary | ICD-10-CM

## 2016-02-15 DIAGNOSIS — R29898 Other symptoms and signs involving the musculoskeletal system: Secondary | ICD-10-CM

## 2016-02-27 ENCOUNTER — Ambulatory Visit (HOSPITAL_BASED_OUTPATIENT_CLINIC_OR_DEPARTMENT_OTHER): Payer: BLUE CROSS/BLUE SHIELD | Attending: Cardiovascular Disease | Admitting: Cardiovascular Disease

## 2016-02-27 VITALS — Ht 69.0 in | Wt 230.0 lb

## 2016-02-27 DIAGNOSIS — G478 Other sleep disorders: Secondary | ICD-10-CM | POA: Diagnosis not present

## 2016-02-27 DIAGNOSIS — R0683 Snoring: Secondary | ICD-10-CM | POA: Insufficient documentation

## 2016-02-27 DIAGNOSIS — Z7982 Long term (current) use of aspirin: Secondary | ICD-10-CM | POA: Diagnosis not present

## 2016-02-27 DIAGNOSIS — G4700 Insomnia, unspecified: Secondary | ICD-10-CM | POA: Diagnosis not present

## 2016-02-27 DIAGNOSIS — G4733 Obstructive sleep apnea (adult) (pediatric): Secondary | ICD-10-CM | POA: Insufficient documentation

## 2016-02-27 DIAGNOSIS — G4761 Periodic limb movement disorder: Secondary | ICD-10-CM | POA: Diagnosis not present

## 2016-02-27 DIAGNOSIS — Z79899 Other long term (current) drug therapy: Secondary | ICD-10-CM | POA: Diagnosis not present

## 2016-02-27 DIAGNOSIS — G4730 Sleep apnea, unspecified: Secondary | ICD-10-CM

## 2016-03-09 ENCOUNTER — Encounter (HOSPITAL_BASED_OUTPATIENT_CLINIC_OR_DEPARTMENT_OTHER): Payer: Self-pay | Admitting: Cardiovascular Disease

## 2016-03-09 NOTE — Procedures (Signed)
Patient Name: Richard Hull, Richard Hull Date: 02/27/2016 Gender: Male D.O.B: 04-02-1976 Age (years): 40 Referring Provider: Shelva Majestic MD, ABSM Height (inches): 69 Interpreting Physician: Shelva Majestic MD, ABSM Weight (lbs): 230 RPSGT: Madelon Lips BMI: 34 MRN: [id number] Neck Size: 18.75  CLINICAL INFORMATION Sleep Study Type: Split Night CPAP Indication for sleep study:  Snoring, nonrestorative sleep Epworth Sleepiness Score: 7  SLEEP STUDY TECHNIQUE As per the AASM Manual for the Scoring of Sleep and Associated Events v2.3 (April 2016) with a hypopnea requiring 4% desaturations. The channels recorded and monitored were frontal, central and occipital EEG, electrooculogram (EOG), submentalis EMG (chin), nasal and oral airflow, thoracic and abdominal wall motion, anterior tibialis EMG, snore microphone, electrocardiogram, and pulse oximetry. Continuous positive airway pressure (CPAP) was initiated when the patient met split night criteria and was titrated according to treat sleep-disordered breathing.  MEDICATIONS vitamin C (ASCORBIC ACID) 500 MG tablet 500 mg, 2 times daily     ticagrelor (BRILINTA) 90 MG TABS tablet 90 mg, 2 times daily     nitroGLYCERIN (NITROSTAT) 0.4 MG SL tablet 0.4 mg, Every 5 min PRN     metoprolol (LOPRESSOR) 50 MG tablet 50 mg, 2 times daily     lisinopril (PRINIVIL,ZESTRIL) 40 MG tablet 40 mg, Daily     Note: Received from: External Pharmacy (Written 01/09/2016 1426)   Icosapent Ethyl (VASCEPA) 1 g CAPS 2 capsule, 2 times daily     ezetimibe (ZETIA) 10 MG tablet 10 mg, Daily     atorvastatin (LIPITOR) 80 MG tablet 80 mg, Daily-1800     aspirin EC 81 MG EC tablet 81 mg, Daily     amLODipine (NORVASC) 5 MG tablet 5 mg, Daily   Medications administered by patient during sleep study : No sleep medicine administered.  RESPIRATORY PARAMETERS Diagnostic Total AHI (/hr): 46.8 RDI (/hr): 52.5 OA Index (/hr): 9.3 CA Index (/hr): 1.8 REM AHI  (/hr): N/A NREM AHI (/hr): 46.8 Supine AHI (/hr): 53.4 Non-supine AHI (/hr): 33.91 Min O2 Sat (%): 88.00 Mean O2 (%): 96.00 Time below 88% (min): 0.4    Titration Optimal Pressure (cm): 10 AHI at Optimal Pressure (/hr): 0.0 Min O2 at Optimal Pressure (%): 95.0 Supine % at Optimal (%): 100 Sleep % at Optimal (%): 100    SLEEP ARCHITECTURE The recording time for the entire night was 395.6 minutes. During a baseline period of 242.2 minutes, the patient slept for 136.0 minutes in REM and nonREM, yielding a sleep efficiency of 56.1%. Sleep onset after lights out was 1.2 minutes with a REM latency of N/A minutes. The patient spent 41.19% of the night in stage N1 sleep, 58.81% in stage N2 sleep, 0.00% in stage N3 and 0.00% in REM. Wake after sleep onset (WASO) was 105 minutes.   During the titration period of 148.6 minutes, the patient slept for 124.9 minutes in REM and nonREM, yielding a sleep efficiency of 84.1%. Sleep onset after CPAP initiation was 8.7 minutes with a REM latency of 29.0 minutes. The patient spent 14.01% of the night in stage N1 sleep, 46.34% in stage N2 sleep, 0.80% in stage N3 and 38.84% in REM.  CARDIAC DATA The 2 lead EKG demonstrated sinus rhythm. The mean heart rate was 75.82 beats per minute. Other EKG findings include: None.  LEG MOVEMENT DATA The total Periodic Limb Movements of Sleep (PLMS) were 124. The PLMS index was 28.47 .  IMPRESSIONS - Severe obstructive sleep apnea occurred during the diagnostic portion of the study (AHI = 46.8/hour);  events were more severe with supine sleep. An optimal PAP pressure was selected for this patient ( 10 cm of water) - No significant central sleep apnea occurred during the diagnostic portion of the study (CAI = 1.8/hour). - Abnormal sleep architecture on the diagnostic portion of the study with absence of slow wave and REM sleep; there is evidence for REM rebound during the titration study. - Mild oxygen desaturation during the  diagnostic portion of the study (Min O2 = 88.00%). - Loud snoring volume during the diagnostic portion of the study. - Increased periodic limb movements of sleep - No cardiac abnormalities were noted during this study. - Moderate periodic limb movements of sleep occurred during the study.  DIAGNOSIS - Obstructive Sleep Apnea (327.23 [G47.33 ICD-10])  RECOMMENDATIONS - Recommend an initial trial of CPAP therapy  with EPR or C-Flex at 10 cm H2O with heated humidification. A Medium size Resmed Full Face Mask AirFit F20 mask was used for the titration. - Effort should be made to optimize nasal and oral pharyngeal patency. - The patient should be counseled to try to avoid supine sleep - Avoid alcohol, sedatives and other CNS depressants that may worsen sleep apnea and disrupt normal sleep architecture. -  If patient is symptomatic with restless legs after CPAP therapy, consider trial of pharmacotherapy. - Sleep hygiene should be reviewed to assess factors that may improve sleep quality. - Weight management and regular exercise should be initiated or continued. - Recommend a download be obtained in 30 days and sleep clinic evaluation.  [Electronically signed] 03/09/2016 07:04 PM  Shelva Majestic MD, [facility], [place], American Board of Sleep Medicine  NPI: [id number] [place] PH: [phone]   FX: [phone] [place]

## 2016-03-15 ENCOUNTER — Telehealth: Payer: Self-pay | Admitting: Cardiovascular Disease

## 2016-03-15 NOTE — Telephone Encounter (Signed)
New Message  Pt calling to speak w/ RN- wanted to discuss sleep study results and follow up about CPAP. Please call back and discuss.

## 2016-03-15 NOTE — Telephone Encounter (Signed)
Returned call to Slater at [employer] answer.

## 2016-03-15 NOTE — Telephone Encounter (Signed)
NEw MEssage  ASley from [facility] wanted to discuss sleep study w/ RN. Please call back and discuss.

## 2016-03-16 NOTE — Telephone Encounter (Signed)
Call and notified patient of sleep study results and recommendations. Referral sent to [facility] for CPAP set up.

## 2016-03-16 NOTE — Telephone Encounter (Signed)
F/u  Pt calling to follow up about previous note- sleep study results. Please call back and discuss.

## 2016-03-19 ENCOUNTER — Telehealth: Payer: Self-pay | Admitting: Cardiovascular Disease

## 2016-03-19 NOTE — Telephone Encounter (Signed)
Per VOHYW'V note previous encounter - referral sent to [facility] based on recent sleep study.

## 2016-03-19 NOTE — Telephone Encounter (Signed)
Informed Morrie Sheldon of updates - see Wanda's note from 7/28

## 2016-03-19 NOTE — Telephone Encounter (Signed)
New message   Office calling did not hear back from the nurse last week regarding follow up on sleep study - will Dr. Tresa Endo be ordering any sleep supplies.

## 2016-04-03 ENCOUNTER — Telehealth: Payer: Self-pay | Admitting: *Deleted

## 2016-04-03 NOTE — Telephone Encounter (Signed)
Pt needs clearance for 2 level ACDF. They are also asking for directions regarding brilinta. Pt has a f/u appointment 06-11-16. Will forward for dr [facility] review and advise

## 2016-04-06 MED ORDER — VALSARTAN 320 MG PO TABS: 320.0000 mg | ORAL_TABLET | Freq: Every day | ORAL | 3 refills | Status: DC

## 2016-04-06 MED ORDER — ROSUVASTATIN CALCIUM 40 MG PO TABS: 40.0000 mg | ORAL_TABLET | Freq: Every day | ORAL | 3 refills | Status: DC

## 2016-04-06 NOTE — Telephone Encounter (Signed)
That is correct lisinopril could be causing dry cough. Can change to valsartan 320mg  daily and discontinue lisinopril.   For muscle aches if it is just when he is out in the sun, this is probably more related to hydration level. If not we can consider changing him to Crestor 40mg . Since he relatively recently had an event at a young age, I would not recommend an extensive drug holiday for statin. Hold x1week to clear out of system then start Crestor.   Defer to Dr. Tresa Endo for clearance for surgery.

## 2016-04-06 NOTE — Telephone Encounter (Signed)
Follow up     Call from pts PCP. Patient called the pcp stating he is having sob----maybe due from his medications.  Please call pt at [phone]

## 2016-04-06 NOTE — Telephone Encounter (Signed)
Attempted to reach medical office - was informed no one named Carollee Herter at that office and they could not figure out who called. I reached out to patient at his cell phone. We spoke about his concerns.  He notes a persistent dry cough. He also has continued bodyaches, note feels like he has a "fever" when out in sun, recurrent headaches. These symptoms have been occurring for several weeks. He was aware that the atorvastatin could be at cause for some of his issues, but I mentioned to him that the lisinopril may be more likely to produce the cough.  Informed patient I would defer to clinical pharmacist to review likely culprits and see if we can put together a plan for holding and/or changing medications.  Pt is also needing clearance for a neck surgery by Dr. Newell Coral. He called a few days ago about this but hasn't heard back. He has a follow up with Dr. Tresa Endo October 23rd, but if needs OV for clearance, will need to be seen sooner. I have informed patient I will send request to Dr. Tresa Endo.

## 2016-04-06 NOTE — Telephone Encounter (Signed)
Spoke w/ patient and gave med change recommendations. He will take 1 week off statin and start the crestor 40mg  daily.  Will switch lisinopril to valsartan - start the valsartan tomorrow.  Pt asked that the Rxs be sent to [facility] in [place], I have submitted these and he is aware.  Pt instructed to call if new concerns or questions.

## 2016-04-16 ENCOUNTER — Ambulatory Visit: Payer: BLUE CROSS/BLUE SHIELD | Admitting: Cardiovascular Disease

## 2016-04-26 ENCOUNTER — Telehealth: Payer: Self-pay | Admitting: Cardiovascular Disease

## 2016-04-26 NOTE — Telephone Encounter (Signed)
New message     Pt calling to see if medical clearance has been approved. He stated that he spoke with the nurse several times and is waiting for a response. Please call.

## 2016-04-26 NOTE — Telephone Encounter (Signed)
There was a call from 8/15 but we only gave patient med recommendations at that time. Pt mentioned a neck surgery needed - Dr. Newell Coral performing. Dr. Tresa Endo may have not received initial inquiry.  Routed to see if any contraindications to tentative surgery, though I have not seen a call from the surgeon's office w/ specifics.   Have attempted to reach patient to discuss, no answer when dialed, and no means to leave voice mail message.

## 2016-04-26 NOTE — Telephone Encounter (Signed)
I don't recall ever speaking with this patient. See your 8/15/phone message. It says something about you sending a message to TK.

## 2016-04-26 NOTE — Telephone Encounter (Signed)
Note for clearance is in Dr Landry Dyke box.

## 2016-04-30 NOTE — Telephone Encounter (Signed)
Spoke w Carollee Herter, who is nurse for patient's PCP office and also sister of patient and listed DPR contact. She voiced concern that patient has not been cleared for his neck surgery, has had continued weakness and pain stemming from this and would need interruption of his Brilinta and thus clearance by Dr. Tresa Endo before proceeding.  Had PCI w stent in Jan this year, has been on DAPT since that time w/o interruption. Caller aware of recommendations of 6 mo's minimum uninterrupted antiplatelet therapy. Notes now has been 8 months.   Pt had request for surgery put in about 1 month ago. I have requested the clearance request be re-sent in case we have misplaced this.  Will request Dr. Tresa Endo review as soon as able.  Carollee Herter voiced frustration that no one has called patient. I explained that I attempted to call patient twice last week when he called, but I had no way to leave msg for patient. She requests call back w any updates. Carollee Herter can be reached at [phone] (cell)

## 2016-04-30 NOTE — Telephone Encounter (Signed)
Pt is getting weaker,need his clearance for his surgery asap. Does pt need to be seen before surgery? Please to do this today if possible.

## 2016-05-01 NOTE — Telephone Encounter (Signed)
Pt had large STEMI 09/15/15; ideally with ACS should continue DAPT for 1 year. Can surgery be delayed until January?

## 2016-05-01 NOTE — Telephone Encounter (Signed)
Request for surgical clearance:  1. What type of surgery is being performed? Two-level C4-5 and C5-6 anterior cervical decompression and arthrodesis  2. When is this surgery scheduled?  TBD  3. Are there any medications that need to be held prior to surgery and how long? Brilinta, ASA for 10 days preoperatively and 3-4 weeks in total (1.5-2.5 weeks following surgery)  4. Name of physician performing surgery? Dr. Newell Coral [facility]  5. What is your office phone and fax number? Phone: [phone]

## 2016-05-01 NOTE — Telephone Encounter (Signed)
Attempted to reach patient.  Home line busy. Cell phone goes to VM box with no way to leave message.  Have also attempted to contact sister, patient DPR. Left msg for her to call when able.   Per discussion w Dr. Tresa Endo, ideally this patient will need to be on uninterrupted therapy for 1 year or face a high risk of restenosis due to the extensiveness of initial thrombus and large stent placement. Dr. Tresa Endo stated a strong preference to delay any procedure which would require interruption of DAPT, especially as suggested by Dr. Newell Coral for 3-4 weeks. This is something which Dr. Tresa Endo will discuss with patient at the upcoming appointment.

## 2016-05-03 NOTE — Telephone Encounter (Signed)
Carollee Herter ( sister ) is calling to speak about Richard Hull . Please call

## 2016-05-03 NOTE — Telephone Encounter (Signed)
Attempt to return call to patient-no answer, VM full.  Home line busy.

## 2016-05-04 NOTE — Telephone Encounter (Signed)
Spoke to [place], questions addressed regarding pt's continued DAPT therapy for 1 yr, aware to call if further concerns, o/w pt should plan to follow up w Dr. Tresa Endo in October as scheduled, discuss plan of care w provider at this time. She voiced understanding and thanks.

## 2016-05-04 NOTE — Telephone Encounter (Signed)
Attempted to reach patient on cell, full VM Box. Left msg on home phone answering machine.  Left msg for Carollee Herter (sister, DPR) to call.

## 2016-06-11 ENCOUNTER — Encounter: Payer: Self-pay | Admitting: Cardiovascular Disease

## 2016-06-11 ENCOUNTER — Ambulatory Visit (INDEPENDENT_AMBULATORY_CARE_PROVIDER_SITE_OTHER): Payer: BLUE CROSS/BLUE SHIELD | Admitting: Cardiovascular Disease

## 2016-06-11 VITALS — BP 141/83 | HR 91 | Ht 69.0 in | Wt 233.4 lb

## 2016-06-11 DIAGNOSIS — G4733 Obstructive sleep apnea (adult) (pediatric): Secondary | ICD-10-CM | POA: Diagnosis not present

## 2016-06-11 DIAGNOSIS — E785 Hyperlipidemia, unspecified: Secondary | ICD-10-CM

## 2016-06-11 DIAGNOSIS — Z955 Presence of coronary angioplasty implant and graft: Secondary | ICD-10-CM

## 2016-06-11 DIAGNOSIS — E669 Obesity, unspecified: Secondary | ICD-10-CM

## 2016-06-11 DIAGNOSIS — I2510 Atherosclerotic heart disease of native coronary artery without angina pectoris: Secondary | ICD-10-CM

## 2016-06-11 DIAGNOSIS — I2111 ST elevation (STEMI) myocardial infarction involving right coronary artery: Secondary | ICD-10-CM | POA: Diagnosis not present

## 2016-06-11 DIAGNOSIS — E782 Mixed hyperlipidemia: Secondary | ICD-10-CM

## 2016-06-11 DIAGNOSIS — I10 Essential (primary) hypertension: Secondary | ICD-10-CM

## 2016-06-11 MED ORDER — METOPROLOL TARTRATE 50 MG PO TABS: 75.0000 mg | ORAL_TABLET | Freq: Two times a day (BID) | ORAL | 11 refills | Status: DC

## 2016-06-11 NOTE — Patient Instructions (Signed)
Your physician has requested that you have en exercise stress myoview. This will be done MID December.  Your physician has recommended you make the following change in your medication:   1.) STOP zetia once current prescription has been completed.  2.) the lopressor has been increased to 75 mg twice a day.   Your physician recommends that you schedule a follow-up appointment in: January 2018.

## 2016-06-13 NOTE — Progress Notes (Signed)
Patient ID: Richard Hull, male   DOB: 17-Jan-1976, 40 y.o.   MRN: [id number]    Primary M.D.: Dr. Shon Baton  HPI: Richard Hull is a 40 y.o. male who presents to the office today for a  5 month follow up cardiology evaluation.  Richard Hull has a history of hyperlipidemia as well as hypertension.  On the morning of 09/15/2015 he was awakened from sleep with severe substernal chest pressure.  He presented to [facility] hospital emergency room where his ECG showed sinus tachycardia with inferior ST segment elevation.  A code STEMI was activated.  He underwent emergent cardiac catheterization by me, which revealed an ulcerated plaque in a large large dominant RCA with distal embolization to the PDA.  Intervention was performed and he ultimately underwent stenting of his RCA proximal to the PDA with insertion of a 3.515 mm Xience Alpine DES stent postdilated 3.75 mm and PTCA of the subtotal mid PDA, which was small caliber and dilated with a 20 balloon.  He was found to have mild concomitant CAD with 30% narrowing in the mid to distal LAD and proximal 30% stenosis.  A subsequent echo done on 09/18/2015 showed an EF of 65-70% without wall motion abnormality.  There was grade 1 diastolic dysfunction.  He was seen for post hospital initial evaluation with [name].  He had developed a dry cough, which was felt to be due to lisinopril, but this ultimately resolved and he has been maintained on lisinopril 40 mg daily, metoprolol 50 mg twice a day, atorvastatin 80 mg with Zetia 10 mg.  He continues to be on dual antiplatelet therapy with aspirin and Brilinta.  He has been taking over-the-counter fish oil.  He has a history of markedly triglycerides and was as high as 1200 several years ago.  When I saw him I was concerned about obstructive sleep apnea in the etiology of his early a.m. MI which awakened him from sleep, suggesting the potential for oxygen desaturation during grams sleep.  I referred him for a  sleep study which confirmed my suspicion.  He was found to have severe obstructive sleep apnea with an AHI of 46.8 per hour.  Events were more severe with supine sleep.  He had abnormal sleep architecture with absence of slow-wave sleep and in rem sleep.  On the diagnostic portion of the study.  With CPAP.  He had significant rim rebound.  He had loud snoring.  During the diagnostic portion of the study.  He'll total.  He was titrated up to 10 cm water pressure has been on CPAP therapy since.  A download was obtained from his area 10 [employer] CPAP unit from 05/12/2016 through October 2 22 2017.  He is meeting compliance standards.  Usage was 93%.  There were some days where the patient has awakened and his mask was off, but he did not recall taking it off.  At 10 cm water pressure.  AHI is excellent at 1.7 per hour.  He is unaware of any breakthrough snoring.  His sleep is now restorative.  Previously he had significant nocturia, which has essentially resolved.  Since I last saw him, he his wife have separated for the past month.  They have 3 children who he still able to see frequently.  He tells me he may ultimately need neck surgery in the future involving C3-C5.  He denies any recurrent episodes of chest pain.  He has not had significant weight loss.  He denies  palpitations.  He presents for evaluation.   Past Medical History:  Diagnosis Date  . Cervical radiculopathy at C5 01/27/2016   left  . Coronary artery disease 08/2015  . Hyperlipidemia LDL goal <70   . Hypertension   . Hypertriglyceridemia   . Left arm weakness   . Myocardial infarction 08/2015    Past Surgical History:  Procedure Laterality Date  . CARDIAC CATHETERIZATION N/A 09/15/2015   Procedure: Left Heart Cath and Coronary Angiography;  Surgeon: Troy Sine, MD; LAD 30%, pRCA 30%, dRCA 30%, RPDA 99%, nl LV function   . CARDIAC CATHETERIZATION N/A 09/15/2015   Procedure: Coronary Stent Intervention;  Surgeon: Troy Sine, MD; 3.515 mm Xience Alpine DES to dRCA/RPDA>> 5% residual     . TONSILLECTOMY      Allergies  Allergen Reactions  . Amoxicillin Rash  . Penicillins Rash    Current Outpatient Prescriptions  Medication Sig Dispense Refill  . amLODipine (NORVASC) 5 MG tablet Take 1 tablet (5 mg total) by mouth daily. 90 tablet 3  . aspirin EC 81 MG EC tablet Take 1 tablet (81 mg total) by mouth daily.    Vanessa Kick Ethyl (VASCEPA) 1 g CAPS Take 2 capsules by mouth 2 (two) times daily. 360 capsule 3  . metoprolol (LOPRESSOR) 50 MG tablet Take 1.5 tablets (75 mg total) by mouth 2 (two) times daily. 90 tablet 11  . nitroGLYCERIN (NITROSTAT) 0.4 MG SL tablet Place 1 tablet (0.4 mg total) under the tongue every 5 (five) minutes as needed for chest pain (CP or SOB). 25 tablet 12  . rosuvastatin (CRESTOR) 40 MG tablet Take 1 tablet (40 mg total) by mouth daily. 30 tablet 3  . ticagrelor (BRILINTA) 90 MG TABS tablet Take 1 tablet (90 mg total) by mouth 2 (two) times daily. 60 tablet 11  . valsartan (DIOVAN) 320 MG tablet Take 1 tablet (320 mg total) by mouth daily. 30 tablet 3  . vitamin C (ASCORBIC ACID) 500 MG tablet Take 500 mg by mouth 2 (two) times daily.     No current facility-administered medications for this visit.     Social History   Social History  . Marital status: Legally Separated    Spouse name: N/A  . Number of children: 3  . Years of education: Some Coll   Occupational History  . Produce Social research officer, government   Social History Main Topics  . Smoking status: Never Smoker  . Smokeless tobacco: Never Used  . Alcohol use 0.0 oz/week     Comment: Rarely  . Drug use: No  . Sexual activity: Yes   Other Topics Concern  . Not on file   Social History Narrative   Multiple family members on his father's side have/had CAD at a young age, uncles and grandparents.   Lives at home with his wife and three children.   Right-handed.   2-3 cups caffeine per week.   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  He is married and has 3 children  Family History  Problem Relation Age of Onset  . Heart attack Father 3    approx age of first stent  . Stroke Father   . Hypertension Father   . Stroke Paternal Grandmother   . Hypertension Paternal Grandmother   . Hypertension Maternal Grandmother   . Hypertension Maternal Grandfather   . Diabetes Maternal Grandfather   . Hypertension Paternal Grandfather   . Diabetes  Paternal Grandfather   . Heart disease Paternal Grandfather   . Skin cancer Mother   . Hyperlipidemia Sister     ROS General: Negative; No fevers, chills, or night sweats HEENT: Negative; No changes in vision or hearing, sinus congestion, difficulty swallowing Pulmonary: Negative; No cough, wheezing, shortness of breath, hemoptysis Cardiovascular: See HPI: No chest pain, presyncope, syncope, palpatations GI: Negative; No nausea, vomiting, diarrhea, or abdominal pain GU: Negative; No dysuria, hematuria, or difficulty voiding Musculoskeletal: Negative; no myalgias, joint pain, or weakness Hematologic: Negative; no easy bruising, bleeding Endocrine: Negative; no heat/cold intolerance; no diabetes, Neuro: Negative; no changes in balance, headaches Skin: Negative; No rashes or skin lesions Psychiatric: Negative; No behavioral problems, depression Sleep: Positive for snoring, and occasional daytime sleepiness, Now on CPAP therapy with resolution of symptoms; no bruxism, restless legs, hypnogognic hallucinations. Other comprehensive 14 point system review is negative   Physical Exam BP (!) 141/83   Pulse 91   Ht _0  (1.753 m)   Wt 233 lb 6.4 oz (105.9 kg)   BMI 34.47 kg/m    Repeat blood pressure by me 160/90  Wt Readings from Last 3 Encounters:  06/11/16 233 lb 6.4 oz (105.9 kg)  02/27/16 230 lb (104.3 kg)  02/15/16 230 lb (104.3 kg)   General: Alert, oriented, no distress.  Skin: normal turgor, no  rashes, warm and dry HEENT: Normocephalic, atraumatic. Pupils equal round and reactive to light; sclera anicteric; extraocular muscles intact, No lid lag; Nose without nasal septal hypertrophy; Mouth/Parynx benign; Mallinpatti scale 3 Neck: No JVD, no carotid bruits; normal carotid upstroke Lungs: clear to ausculatation and percussion bilaterally; no wheezing or rales, normal inspiratory and expiratory effort Chest wall: without tenderness to palpitation Heart: PMI not displaced, RRR, s1 s2 normal, Faint 1/6 systolic murmur, No diastolic murmur, no rubs, gallops, thrills, or heaves Abdomen: soft, nontender; no hepatosplenomehaly, BS+; abdominal aorta nontender and not dilated by palpation. Back: no CVA tenderness Pulses: 2+ ;right wrist catheterization site stable Musculoskeletal: full range of motion, normal strength, no joint deformities Extremities: Pulses 2+, no clubbing cyanosis or edema, Homan's sign negative  Neurologic: grossly nonfocal; Cranial nerves grossly wnl Psychologic: Normal mood and affect  ECG (independently read by me): Normal sinus rhythm at 91 bpm.  No ectopy.  Normal intervals.  May 2017 ECG (independently read by me): Normal sinus rhythm at 73 bpm.  Small nondiagnostic inferior Q waves with T-wave inversion.  LABS:  BMP Latest Ref Rng & Units 09/16/2015 09/15/2015 09/15/2015  Glucose 65 - 99 mg/dL 133(H) 166(H) 144(H)  BUN 6 - 20 mg/dL _1 Creatinine 0.61 - 1.24 mg/dL 0.81 0.70 0.84  Sodium 135 - 145 mmol/L 137 139 140  Potassium 3.5 - 5.1 mmol/L 3.9 3.9 4.5  Chloride 101 - 111 mmol/L 104 105 101  CO2 22 - 32 mmol/L 20(L) - 25  Calcium 8.9 - 10.3 mg/dL 8.7(L) - 9.3     Hepatic Function Latest Ref Rng & Units 09/15/2015 03/08/2014  Total Protein 6.5 - 8.1 g/dL 6.4(L) 7.1  Albumin 3.5 - 5.0 g/dL 4.0 4.5  AST 15 - 41 U/L 36 31  ALT 17 - 63 U/L 54 53  Alk Phosphatase 38 - 126 U/L 66 84  Total Bilirubin 0.3 - 1.2 mg/dL 0.8 0.3    CBC Latest Ref Rng & Units  09/16/2015 09/15/2015 09/15/2015  WBC 4.0 - 10.5 K/uL 10.9(H) - 14.1(H)  Hemoglobin 13.0 - 17.0 g/dL 13.7 16.0 16.3  Hematocrit 39.0 - 52.0 %  40.8 47.0 48.6  Platelets 150 - 400 K/uL 223 - 249   Lab Results  Component Value Date   MCV 82.1 09/16/2015   MCV 81.8 09/15/2015   MCV 80.4 03/08/2014    Lab Results  Component Value Date   TSH 5.921 (H) 09/17/2015    BNP No results found for: BNP  ProBNP No results found for: PROBNP   Lipid Panel     Component Value Date/Time   CHOL 216 (H) 09/16/2015 0335   TRIG 635 (H) 09/16/2015 0335   HDL 32 (L) 09/16/2015 0335   CHOLHDL 6.8 09/16/2015 0335   VLDL UNABLE TO CALCULATE IF TRIGLYCERIDE OVER 400 mg/dL 09/16/2015 0335   LDLCALC UNABLE TO CALCULATE IF TRIGLYCERIDE OVER 400 mg/dL 09/16/2015 0335     RADIOLOGY: No results found.    ASSESSMENT AND PLAN: Richard Hull is a 40 year old male who had a history of previously untreated hyperlipidemia, as well as hypertension.  He was awakened from sleep on the morning of 09/15/2015 with chest pain and was found to have inferior ST elevation.  He underwent successful emergent catheterization and intervention to an ulcerated plaque.  His RCA proximal to the PDA takeoff and there was evidence for distal embolization of clot to the PDA.  He underwent successful stenting at the site of the ulcerated plaque and PTCA in the distal PDA.  I'm concerned that with his MI occurring in the middle of the night and when I saw him in follow-up I was concerned about sleep apnea which may have been contributory to his symptomatology.  This may have contributed to increased inflammation with ultimate plaque rupture during potential hypoxemia.  Sleep study reveals severe sleep apnea with an AHI of 46.8 per hour.  He has been started on CPAP therapy and notes significant benefit in his alertness, his sleep is nonrestorative, he is not snoring, nocturia has significantly decreased, and he feels significantly  better.  I reviewed a download and he is meeting compliance standards.  His AHI is excellent at 1.7 with treatment.  He is using a nasal mask and [employer] is his DME company. When   I last saw him, his blood pressure was elevated and  I added amlodipine to his beta blocker and ARB regimen.Marland Kitchen His blood pressure today is improved, but his resting pulse is still in the 90s.  He continues to be on amlodipine 5 mg, valsartan 320 mg and metoprolol 50 g twice a day.  I am further titrating his Lopressor to 75 mg twice a day I reviewed recent laboratory which was done by [name] which demonstrated total cholesterol166, triglycerides 309, HDL 35 and LDL 69, on his present therapy.  He is now on Vascepa 2 capsules twice day for triglyceride improvement.  I have recommended weight loss and exercise.  Time spent: 25 minutes  Troy Sine, MD, [facility]  06/13/2016 12:57 PM

## 2016-06-25 ENCOUNTER — Telehealth: Payer: Self-pay | Admitting: Cardiovascular Disease

## 2016-06-25 NOTE — Telephone Encounter (Signed)
New message  Pt calling, ok with risk  Has previously been cleared for February surgery  Needs surgical clearance sooner than February  1. Neuro:neck surgery on discs 2. Not scheduled yet 3.meds to be withheld: all blood thinners      How long: 4. Physician: dr. Newell Coral 5. Phone # [phone]

## 2016-06-25 NOTE — Telephone Encounter (Signed)
See previous notes. I know that we discussed risk stratification for this patient and he was recommended for full year of DAPT therapy due to extensive PCI.  Just seen on 10/23 w recommendations for follow up testing. Please advise.

## 2016-06-28 ENCOUNTER — Telehealth: Payer: Self-pay | Admitting: Cardiovascular Disease

## 2016-06-28 NOTE — Telephone Encounter (Signed)
Pt notified of recommendation for February states that he is going to call Neuro to see if he can sign some kind of waiver to have surgery against medical advice.

## 2016-06-28 NOTE — Telephone Encounter (Signed)
I returned a call to patient informing him per Dr Landry Dyke recommendations that he postpone his planned Neurosurgery until after 09/20/16 due to his STEMI being 09/15/15. He cannot stop his Brilinta and ASA. Patient states that he is aware of this, and that Dr Tresa Endo told him at his last appointment  that he would clear him if he was adamant about having the surgery done. " I don't know if he was just trying to get me out of his office, or what but I am not feeling to great about this decision or Dr Tresa Endo right now." I tried again explaining to the patient that Dr Tresa Endo only has his best interest in mind when he made this decision. He does not want to put him at risk of having another MI from holding his Brilinta. Patient would like for me to have Dr Tresa Endo to revisit his decision.message will be routed to Dr Tresa Endo for review and recommendations.

## 2016-06-28 NOTE — Telephone Encounter (Signed)
Pt calling again to get surgical clearance for neuro surgeon to do surgery in December/cannot wait until February   Please see previous notes  Please call pt and advise  Needs surgical clearance sooner than February  1. Neuro:neck surgery on discs 2. Not scheduled yet 3.meds to be withheld: all blood thinners      How long: 4. Physician: dr. Newell Coral 5. Phone # [phone]

## 2016-06-28 NOTE — Telephone Encounter (Signed)
This was faxed over to them on 06/25/16. Per Dr Landry Dyke recommendation the patient cannot have surgery until after 09/20/16

## 2016-06-28 NOTE — Telephone Encounter (Signed)
See other message-this is duplicate

## 2016-06-28 NOTE — Telephone Encounter (Signed)
New message  Please return patient's call concerning surgery

## 2016-07-02 NOTE — Telephone Encounter (Signed)
As per our discussion; continue plan for neurosurgey in late Jan/early Feb 2018.

## 2016-08-02 ENCOUNTER — Telehealth (HOSPITAL_COMMUNITY): Payer: Self-pay

## 2016-08-02 NOTE — Telephone Encounter (Signed)
Encounter complete. 

## 2016-08-06 ENCOUNTER — Telehealth: Payer: Self-pay | Admitting: Cardiovascular Disease

## 2016-08-06 NOTE — Telephone Encounter (Signed)
Attempted to leave message that pt's myoview for 08-07-16 is cancelled d/t insurance denied authorization on pt's cell - no answer and voicemail not set up, home - no answer or voicemail, [phone] - disconnected, and left message on wife's phone - [phone]

## 2016-08-07 ENCOUNTER — Inpatient Hospital Stay (HOSPITAL_COMMUNITY): Admission: RE | Admit: 2016-08-07 | Payer: BLUE CROSS/BLUE SHIELD | Source: Ambulatory Visit

## 2016-09-13 ENCOUNTER — Encounter: Payer: Self-pay | Admitting: Cardiovascular Disease

## 2016-09-13 ENCOUNTER — Ambulatory Visit (INDEPENDENT_AMBULATORY_CARE_PROVIDER_SITE_OTHER): Payer: BLUE CROSS/BLUE SHIELD | Admitting: Cardiovascular Disease

## 2016-09-13 VITALS — BP 124/74 | HR 78 | Ht 69.0 in | Wt 242.0 lb

## 2016-09-13 DIAGNOSIS — Z01818 Encounter for other preprocedural examination: Secondary | ICD-10-CM

## 2016-09-13 DIAGNOSIS — G4733 Obstructive sleep apnea (adult) (pediatric): Secondary | ICD-10-CM | POA: Diagnosis not present

## 2016-09-13 DIAGNOSIS — I2111 ST elevation (STEMI) myocardial infarction involving right coronary artery: Secondary | ICD-10-CM

## 2016-09-13 DIAGNOSIS — I10 Essential (primary) hypertension: Secondary | ICD-10-CM

## 2016-09-13 DIAGNOSIS — I2510 Atherosclerotic heart disease of native coronary artery without angina pectoris: Secondary | ICD-10-CM

## 2016-09-13 DIAGNOSIS — E669 Obesity, unspecified: Secondary | ICD-10-CM

## 2016-09-13 DIAGNOSIS — E785 Hyperlipidemia, unspecified: Secondary | ICD-10-CM

## 2016-09-13 NOTE — Patient Instructions (Signed)
Your physician has requested that you have en exercise stress myoview. For further information please visit [email or web address]. Please follow instruction sheet, as given.  Your physician wants you to follow-up in: 6 months or sooner if needed. You will receive a reminder letter in the mail two months in advance. If you don't receive a letter, please call our office to schedule the follow-up appointment.  If you need a refill on your cardiac medications before your next appointment, please call your pharmacy. 

## 2016-09-14 NOTE — Progress Notes (Signed)
Patient ID: Richard Hull, male   DOB: 1976/05/28, 41 y.o.   MRN: [id number]    Primary M.D.: Dr. Shon Baton  HPI: Richard Hull is a 41 y.o. male who presents to the office today for a 3 month follow up cardiology evaluation.  Richard Hull has a history of hyperlipidemia as well as hypertension.  On the morning of 09/15/2015 he was awakened from sleep with severe substernal chest pressure.  He presented to [facility] hospital emergency room where his ECG showed sinus tachycardia with inferior ST segment elevation.  A code STEMI was activated.  He underwent emergent cardiac catheterization by me, which revealed an ulcerated plaque in a large large dominant RCA with distal embolization to the PDA.  Intervention was performed and he ultimately underwent stenting of his RCA proximal to the PDA with insertion of a 3.515 mm Xience Alpine DES stent postdilated 3.75 mm and PTCA of the subtotal mid PDA, which was small caliber and dilated with a 20 balloon.  He was found to have mild concomitant CAD with 30% narrowing in the mid to distal LAD and proximal 30% stenosis.  A subsequent echo done on 09/18/2015 showed an EF of 65-70% without wall motion abnormality.  There was grade 1 diastolic dysfunction.  He was seen for post hospital initial evaluation with Richard Hull.  He had developed a dry cough, which was felt to be due to lisinopril, but this ultimately resolved and he has been maintained on lisinopril 40 mg daily, metoprolol 50 mg twice a day, atorvastatin 80 mg with Zetia 10 mg.  He continues to be on dual antiplatelet therapy with aspirin and Brilinta.  He has been taking over-the-counter fish oil.  He has a history of markedly triglycerides and was as high as 1200 several years ago.  When I saw him I was concerned about obstructive sleep apnea in the etiology of his early a.m. MI which awakened him from sleep, suggesting the potential for oxygen desaturation during grams sleep.  I referred him for a  sleep study which confirmed my suspicion.  He was found to have severe obstructive sleep apnea with an AHI of 46.8 per hour.  Events were more severe with supine sleep.  He had abnormal sleep architecture with absence of slow-wave sleep and in rem sleep.  On the diagnostic portion of the study.  With CPAP.  He had significant rim rebound.  He had loud snoring.  During the diagnostic portion of the study.  He'll total.  He was titrated up to 10 cm water pressure has been on CPAP therapy since.  A download was obtained from his area 10 AutoSet Res[employer] CPAP unit from 05/12/2016 through October 2 22 2017.  He is meeting compliance standards.  Usage was 93%.  There were some days where the patient has awakened and his mask was off, but he did not recall taking it off.  At 10 cm water pressure.  AHI is excellent at 1.7 per hour.  He is unaware of any breakthrough snoring.  His sleep is now restorative.  Previously he had significant nocturia, which has essentially resolved.  He has cervical disc disease and will need to undergo C4-C6 cervical disc surgery by Dr. Rita Ohara.  When I had last seen him several months ago.  He discussed this and I recommended that we continue dual antiplatelet therapy for minimum of 1 year prior to discontinuance for surgery since he will need to be holding this for at least 10  days before and 3 weeks after, according to his neurosurgeon.  He denies any episodes of chest pain or shortness of breath.  He is unaware of any palpitations.  He continues to use CPAP with 100% compliance.  [employer] is his DME company and they have recommended he switch to CPAP auto rather than a  Fixed set pressure.  He presents for preoperative clearance.   Past Medical History:  Diagnosis Date  . Cervical radiculopathy at C5 01/27/2016   left  . Coronary artery disease 08/2015  . Hyperlipidemia LDL goal <70   . Hypertension   . Hypertriglyceridemia   . Left arm weakness   . Myocardial infarction 08/2015      Past Surgical History:  Procedure Laterality Date  . CARDIAC CATHETERIZATION N/A 09/15/2015   Procedure: Left Heart Cath and Coronary Angiography;  Surgeon: Troy Sine, MD; LAD 30%, pRCA 30%, dRCA 30%, RPDA 99%, nl LV function   . CARDIAC CATHETERIZATION N/A 09/15/2015   Procedure: Coronary Stent Intervention;  Surgeon: Troy Sine, MD; 3.515 mm Xience Alpine DES to dRCA/RPDA>> 5% residual     . TONSILLECTOMY      Allergies  Allergen Reactions  . Amoxicillin Rash  . Penicillins Rash    Current Outpatient Prescriptions  Medication Sig Dispense Refill  . amLODipine (NORVASC) 5 MG tablet Take 1 tablet (5 mg total) by mouth daily. 90 tablet 3  . aspirin EC 81 MG EC tablet Take 1 tablet (81 mg total) by mouth daily.    Marland Kitchen escitalopram (LEXAPRO) 10 MG tablet Take 10 mg by mouth daily.    Richard Hull (VASCEPA) 1 g CAPS Take 2 capsules by mouth 2 (two) times daily. 360 capsule 3  . metoprolol (LOPRESSOR) 50 MG tablet Take 1.5 tablets (75 mg total) by mouth 2 (two) times daily. (Patient taking differently: Take 50 mg by mouth 2 (two) times daily. ) 90 tablet 11  . nitroGLYCERIN (NITROSTAT) 0.4 MG SL tablet Place 1 tablet (0.4 mg total) under the tongue every 5 (five) minutes as needed for chest pain (CP or SOB). 25 tablet 12  . ticagrelor (BRILINTA) 90 MG TABS tablet Take 1 tablet (90 mg total) by mouth 2 (two) times daily. 60 tablet 11  . valsartan (DIOVAN) 320 MG tablet Take 1 tablet (320 mg total) by mouth daily. 30 tablet 3  . vitamin C (ASCORBIC ACID) 500 MG tablet Take 500 mg by mouth 2 (two) times daily.    . rosuvastatin (CRESTOR) 40 MG tablet Take 40 mg by mouth daily.     No current facility-administered medications for this visit.     Social History   Social History  . Marital status: Legally Separated    Spouse name: N/A  . Number of children: 3  . Years of education: Some Coll   Occupational History  . Produce Social research officer, government   Social History  Main Topics  . Smoking status: Never Smoker  . Smokeless tobacco: Never Used  . Alcohol use 0.0 oz/week     Comment: Rarely  . Drug use: No  . Sexual activity: Yes   Other Topics Concern  . Not on file   Social History Narrative   Multiple family members on his father's side have/had CAD at a young age, uncles and grandparents.   Lives at home with his wife and three children.   Right-handed.   2-3 cups caffeine per week.   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 He his wife have separated for the past month.  They have 3 children who he still able to see frequently.    Family History  Problem Relation Age of Onset  . Heart attack Father 24    approx age of first stent  . Stroke Father   . Hypertension Father   . Stroke Paternal Grandmother   . Hypertension Paternal Grandmother   . Hypertension Maternal Grandmother   . Hypertension Maternal Grandfather   . Diabetes Maternal Grandfather   . Hypertension Paternal Grandfather   . Diabetes Paternal Grandfather   . Heart disease Paternal Grandfather   . Skin cancer Mother   . Hyperlipidemia Sister     ROS General: Negative; No fevers, chills, or night sweats HEENT: Negative; No changes in vision or hearing, sinus congestion, difficulty swallowing Pulmonary: Negative; No cough, wheezing, shortness of breath, hemoptysis Cardiovascular: See HPI: No chest pain, presyncope, syncope, palpatations GI: Negative; No nausea, vomiting, diarrhea, or abdominal pain GU: Negative; No dysuria, hematuria, or difficulty voiding Musculoskeletal: He is in need for cervical C3-6 surgery.   Hematologic: Negative; no easy bruising, bleeding Endocrine: Negative; no heat/cold intolerance; no diabetes, Neuro: Negative; no changes in balance, headaches Skin: Negative; No rashes or skin lesions Psychiatric: Negative; No behavioral problems, depression Sleep: Positive for snoring, and occasional daytime sleepiness,  Now on CPAP therapy with resolution of symptoms; no bruxism, restless legs, hypnogognic hallucinations. Other comprehensive 14 point system review is negative   Physical Exam BP 124/74   Pulse 78   Ht _0  (1.753 m)   Wt 242 lb (109.8 kg)   BMI 35.74 kg/m    Repeat blood pressure by me 160/90  Wt Readings from Last 3 Encounters:  09/13/16 242 lb (109.8 kg)  06/11/16 233 lb 6.4 oz (105.9 kg)  02/27/16 230 lb (104.3 kg)   General: Alert, oriented, no distress.  Skin: normal turgor, no rashes, warm and dry HEENT: Normocephalic, atraumatic. Pupils equal round and reactive to light; sclera anicteric; extraocular muscles intact, No lid lag; Nose without nasal septal hypertrophy; Mouth/Parynx benign; Mallinpatti scale 3 Neck: No JVD, no carotid bruits; normal carotid upstroke Lungs: clear to ausculatation and percussion bilaterally; no wheezing or rales, normal inspiratory and expiratory effort Chest wall: without tenderness to palpitation Heart: PMI not displaced, RRR, s1 s2 normal, Faint 1/6 systolic murmur, No diastolic murmur, no rubs, gallops, thrills, or heaves Abdomen: soft, nontender; no hepatosplenomehaly, BS+; abdominal aorta nontender and not dilated by palpation. Back: no CVA tenderness Pulses: 2+ ;right wrist catheterization site stable Musculoskeletal: full range of motion, normal strength, no joint deformities Extremities: Pulses 2+, no clubbing cyanosis or edema, Homan's sign negative  Neurologic: grossly nonfocal; Cranial nerves grossly wnl Psychologic: Normal mood and affect  ECG (independently read by me): Normal sinus rhythm at 78 bpm.  PR interval 142 ms, QTc interval 435 ms.  No ECG evidence for his prior myocardial infarction.  October 2017 ECG (independently read by me): Normal sinus rhythm at 91 bpm.  No ectopy.  Normal intervals.  May 2017 ECG (independently read by me): Normal sinus rhythm at 73 bpm.  Small nondiagnostic inferior Q waves with T-wave  inversion.  LABS:  BMP Latest Ref Rng & Units 09/16/2015 09/15/2015 09/15/2015  Glucose 65 - 99 mg/dL 133(H) 166(H) 144(H)  BUN 6 - 20 mg/dL _1 Creatinine 0.61 - 1.24 mg/dL 0.81 0.70 0.84  Sodium 135 - 145 mmol/L 137 139 140  Potassium  3.5 - 5.1 mmol/L 3.9 3.9 4.5  Chloride 101 - 111 mmol/L 104 105 101  CO2 22 - 32 mmol/L 20(L) - 25  Calcium 8.9 - 10.3 mg/dL 8.7(L) - 9.3     Hepatic Function Latest Ref Rng & Units 09/15/2015 03/08/2014  Total Protein 6.5 - 8.1 g/dL 6.4(L) 7.1  Albumin 3.5 - 5.0 g/dL 4.0 4.5  AST 15 - 41 U/L 36 31  ALT 17 - 63 U/L 54 53  Alk Phosphatase 38 - 126 U/L 66 84  Total Bilirubin 0.3 - 1.2 mg/dL 0.8 0.3    CBC Latest Ref Rng & Units 09/16/2015 09/15/2015 09/15/2015  WBC 4.0 - 10.5 K/uL 10.9(H) - 14.1(H)  Hemoglobin 13.0 - 17.0 g/dL 13.7 16.0 16.3  Hematocrit 39.0 - 52.0 % 40.8 47.0 48.6  Platelets 150 - 400 K/uL 223 - 249   Lab Results  Component Value Date   MCV 82.1 09/16/2015   MCV 81.8 09/15/2015   MCV 80.4 03/08/2014    Lab Results  Component Value Date   TSH 5.921 (H) 09/17/2015    BNP No results found for: BNP  ProBNP No results found for: PROBNP   Lipid Panel     Component Value Date/Time   CHOL 216 (H) 09/16/2015 0335   TRIG 635 (H) 09/16/2015 0335   HDL 32 (L) 09/16/2015 0335   CHOLHDL 6.8 09/16/2015 0335   VLDL UNABLE TO CALCULATE IF TRIGLYCERIDE OVER 400 mg/dL 09/16/2015 0335   LDLCALC UNABLE TO CALCULATE IF TRIGLYCERIDE OVER 400 mg/dL 09/16/2015 0335     RADIOLOGY: No results found.  IMPRESSION:  1. ST elevation (STEMI) myocardial infarction involving right coronary artery ([place])   2. Hyperlipidemia LDL goal <70   3. OSA (obstructive sleep apnea)   4. Mild obesity   5. CAD in native artery   6. Preoperative clearance   7. Essential hypertension     ASSESSMENT AND PLAN: Mr. Richard Hull is a 41 year old male who had a history of previously untreated hyperlipidemia, as well as hypertension.  He was awakened  from sleep on the morning of 09/15/2015 with chest pain and was found to have inferior ST elevation.  He underwent successful emergent catheterization and intervention to an ulcerated plaque.  His RCA proximal to the PDA takeoff and there was evidence for distal embolization of clot to the PDA.  He underwent successful stenting at the site of the ulcerated plaque and PTCA in the distal PDA.  I'm concerned that with his MI occurring in the middle of the night and when I saw him in follow-up I was concerned about sleep apnea which may have been contributory to his symptomatology.  This may have contributed to increased inflammation with ultimate plaque rupture during potential hypoxemia.  He was found to have severe obstructive sleep apnea with an AHI of 46.8 per hour.  He has been started on CPAP therapy and notes significant benefit in his alertness, his sleep is nonrestorative, he is not snoring, nocturia has significantly decreased, and he feels significantly better.  I last saw him, I reviewed a download and he is meeting compliance standards.  His AHI was  excellent at 1.7 with treatment.  He is using a nasal mask and [employer] is his DME company. When I  saw him, his blood pressure was elevated and  I added amlodipine to his beta blocker and ARB regimen.  He was last seen, I further titrated metoprolol to 75 mg twice a day.  His blood pressure today  is well controlled at 124/74 on a medical regimen consisting of amlodipine 5 mg, metoprolol 75 mm twice a day, and valsartan 3 and 29 g daily.  He continues to be on dual antiplatelet therapy with aspirin and Brilinta.  He is on Crestor 40 mg daily for hyperlipidemia with target LDL less than 70.  He is now one year following his acute coronary syndrome.  Schedule him for preoperative exercise nuclear perfusion study for risk stratification.  As long as this is not high risk.  He will be given clearance for planned cervical neurosurgery.  Per his neurosurgeon, they  have recommended he hold Brilinta aspirin for 10 days prior to surgery and he may need to hold this for 3 weeks postoperatively.  We discussed the importance of weight loss.  Have reviewed laboratory from his primary physician from October 2017.  On his lipid-lowering therapy.  Lipid studies were excellent with a total cholesterol down to 83 and LDL cholesterol 28, triglycerides 95, although HDL remains low at 36.  I will contact him regarding his nuclear perfusion study.  We will also adjust his CPAP to an auto mode as requested by his DME company.  I will see him in 4-6 months for reevaluation. Time spent: 25 minutes  Troy Sine, MD, [facility]  09/14/2016 11:13 AM

## 2016-09-18 ENCOUNTER — Telehealth (HOSPITAL_COMMUNITY): Payer: Self-pay

## 2016-09-18 NOTE — Telephone Encounter (Signed)
Duplicate Entry. Encounter complete.

## 2016-09-18 NOTE — Telephone Encounter (Signed)
Encounter complete. 

## 2016-09-19 ENCOUNTER — Telehealth (HOSPITAL_COMMUNITY): Payer: Self-pay

## 2016-09-19 NOTE — Telephone Encounter (Signed)
Encounter complete. 

## 2016-09-20 ENCOUNTER — Ambulatory Visit (HOSPITAL_COMMUNITY)
Admission: RE | Admit: 2016-09-20 | Discharge: 2016-09-20 | Disposition: A | Discharge status: Home / Self Care | Payer: BLUE CROSS/BLUE SHIELD | Source: Ambulatory Visit | Attending: Cardiovascular Disease | Admitting: Cardiovascular Disease

## 2016-09-20 ENCOUNTER — Telehealth (HOSPITAL_COMMUNITY): Payer: Self-pay

## 2016-09-20 DIAGNOSIS — I2111 ST elevation (STEMI) myocardial infarction involving right coronary artery: Secondary | ICD-10-CM

## 2016-09-20 DIAGNOSIS — I252 Old myocardial infarction: Secondary | ICD-10-CM | POA: Insufficient documentation

## 2016-09-20 DIAGNOSIS — I2510 Atherosclerotic heart disease of native coronary artery without angina pectoris: Secondary | ICD-10-CM | POA: Insufficient documentation

## 2016-09-20 DIAGNOSIS — Z8249 Family history of ischemic heart disease and other diseases of the circulatory system: Secondary | ICD-10-CM | POA: Insufficient documentation

## 2016-09-20 DIAGNOSIS — E669 Obesity, unspecified: Secondary | ICD-10-CM | POA: Insufficient documentation

## 2016-09-20 DIAGNOSIS — I10 Essential (primary) hypertension: Secondary | ICD-10-CM | POA: Diagnosis not present

## 2016-09-20 DIAGNOSIS — Z6834 Body mass index (BMI) 34.0-34.9, adult: Secondary | ICD-10-CM | POA: Diagnosis not present

## 2016-09-20 DIAGNOSIS — R9439 Abnormal result of other cardiovascular function study: Secondary | ICD-10-CM | POA: Insufficient documentation

## 2016-09-20 DIAGNOSIS — G4733 Obstructive sleep apnea (adult) (pediatric): Secondary | ICD-10-CM | POA: Insufficient documentation

## 2016-09-20 LAB — MYOCARDIAL PERFUSION IMAGING
CHL CUP NUCLEAR SDS: 4
CSEPPHR: 107 {beats}/min
LV dias vol: 149 mL (ref 62–150)
LV sys vol: 69 mL
Rest HR: 82 {beats}/min
SRS: 2
SSS: 6
TID: 1.3

## 2016-09-20 MED ORDER — TECHNETIUM TC 99M TETROFOSMIN IV KIT
32.0000 | PACK | Freq: Once | INTRAVENOUS | Status: AC | PRN
  Administered 2016-09-20: 32 via INTRAVENOUS
  Filled 2016-09-20: qty 32

## 2016-09-20 MED ORDER — REGADENOSON 0.4 MG/5ML IV SOLN
0.4000 mg | Freq: Once | INTRAVENOUS | Status: AC
  Administered 2016-09-20: 0.4 mg via INTRAVENOUS

## 2016-09-20 MED ORDER — TECHNETIUM TC 99M TETROFOSMIN IV KIT
9.4000 | PACK | Freq: Once | INTRAVENOUS | Status: AC | PRN
  Administered 2016-09-20: 9.4 via INTRAVENOUS
  Filled 2016-09-20: qty 10

## 2016-09-20 NOTE — Telephone Encounter (Signed)
Encounter duplication error.

## 2016-09-21 ENCOUNTER — Other Ambulatory Visit: Payer: Self-pay | Admitting: Cardiovascular Disease

## 2016-09-21 MED ORDER — ROSUVASTATIN CALCIUM 40 MG PO TABS: 40.0000 mg | ORAL_TABLET | Freq: Every day | ORAL | 3 refills | Status: DC

## 2016-09-21 MED ORDER — VALSARTAN 320 MG PO TABS: 320.0000 mg | ORAL_TABLET | Freq: Every day | ORAL | 3 refills | Status: DC

## 2016-09-21 NOTE — Telephone Encounter (Signed)
Rx(s) sent to pharmacy electronically.  

## 2016-09-27 ENCOUNTER — Telehealth: Payer: Self-pay | Admitting: *Deleted

## 2016-09-27 NOTE — Telephone Encounter (Signed)
-----   Message from Lennette Bihari, MD sent at 09/23/2016 11:37 AM EST ----- Although the study is low risk, there is definite ischemia seen in all 3 views in the mid to basal inferior and inferoseptal wall in this patient who had a prior MI due to his RCA.  He is asymptomatic. Add-on to my next office visit to discuss options including the possibility of repeating a cardiac catheterization prior to his planned cervical neck surgery

## 2016-09-27 NOTE — Telephone Encounter (Signed)
Called patient to inform him of nuc results and recommendations. Appointment date and time offered  per Dr Tresa Endo to discuss and potentially schedule for heart catherization. Patient states that he has already made his schedule out for next week and will have to check to see if he can make the offered appointment and call me back later.

## 2016-10-04 ENCOUNTER — Ambulatory Visit: Payer: BLUE CROSS/BLUE SHIELD | Admitting: Cardiovascular Disease

## 2016-11-20 ENCOUNTER — Encounter: Payer: Self-pay | Admitting: Cardiovascular Disease

## 2016-11-20 ENCOUNTER — Ambulatory Visit (INDEPENDENT_AMBULATORY_CARE_PROVIDER_SITE_OTHER): Payer: BLUE CROSS/BLUE SHIELD | Admitting: Cardiovascular Disease

## 2016-11-20 VITALS — BP 114/78 | HR 62 | Ht 69.0 in | Wt 221.0 lb

## 2016-11-20 DIAGNOSIS — Z9989 Dependence on other enabling machines and devices: Secondary | ICD-10-CM | POA: Diagnosis not present

## 2016-11-20 DIAGNOSIS — G4733 Obstructive sleep apnea (adult) (pediatric): Secondary | ICD-10-CM

## 2016-11-20 DIAGNOSIS — I2111 ST elevation (STEMI) myocardial infarction involving right coronary artery: Secondary | ICD-10-CM

## 2016-11-20 DIAGNOSIS — Z01818 Encounter for other preprocedural examination: Secondary | ICD-10-CM

## 2016-11-20 DIAGNOSIS — Z955 Presence of coronary angioplasty implant and graft: Secondary | ICD-10-CM

## 2016-11-20 DIAGNOSIS — R9439 Abnormal result of other cardiovascular function study: Secondary | ICD-10-CM

## 2016-11-20 DIAGNOSIS — Z01812 Encounter for preprocedural laboratory examination: Secondary | ICD-10-CM

## 2016-11-20 DIAGNOSIS — Z7901 Long term (current) use of anticoagulants: Secondary | ICD-10-CM

## 2016-11-20 NOTE — Patient Instructions (Addendum)
Your physician has requested that you have a cardiac catheterization April 11th with Dr Tresa Endo. Cardiac catheterization is used to diagnose and/or treat various heart conditions. Doctors may recommend this procedure for a number of different reasons. The most common reason is to evaluate chest pain. Chest pain can be a symptom of coronary artery disease (CAD), and cardiac catheterization can show whether plaque is narrowing or blocking your heart's arteries. This procedure is also used to evaluate the valves, as well as measure the blood flow and oxygen levels in different parts of your heart. For further information please visit [email or web address]   Following your catheterization, you will not be allowed to drive for 3 days.  No lifting, pushing, or pulling greater that 10 pounds is allowed for 1 week.  You will be required to have the following tests prior to the procedure:  1. Blood work-the blood work can be done no more than 7 days prior to the procedure.  It can be done at any [facility] lab.  There is one downstairs on the first floor of this building and one in the [facility] building [phone]).  2. Chest Xray-the chest xray order has already been placed at the [facility] Building.

## 2016-11-20 NOTE — Progress Notes (Signed)
Patient ID: Richard Hull, male   DOB: 01/19/1976, 41 y.o.   MRN: [id number]    Primary M.D.: Dr. John Russo  HPI: Richard Hull is a 41 y.o. male who presents to the office today for a 2 month follow up cardiology evaluation.  Mr. Richard Hull has a history of hyperlipidemia as well as hypertension.  On the morning of 09/15/2015 he was awakened from sleep with severe substernal chest pressure.  He presented to [facility] emergency room where his ECG showed sinus tachycardia with inferior ST segment elevation.  A code STEMI was activated.  He underwent emergent cardiac catheterization by me, which revealed an ulcerated plaque in a large large dominant RCA with distal embolization to the PDA.  Intervention was performed and he ultimately underwent stenting of his RCA proximal to the PDA with insertion of a 3.515 mm Xience Alpine DES stent postdilated 3.75 mm and PTCA of the subtotal mid PDA, which was small caliber and dilated with a 20 balloon.  He was found to have mild concomitant CAD with 30% narrowing in the mid to distal LAD and proximal 30% stenosis.  A subsequent echo done on 09/18/2015 showed an EF of 65-70% without wall motion abnormality.  There was grade 1 diastolic dysfunction.  He was seen for post hospital initial evaluation with Rhonda Barrett.  He had developed a dry cough, which was felt to be due to lisinopril, but this ultimately resolved and he has been maintained on lisinopril 40 mg daily, metoprolol 50 mg twice a day, atorvastatin 80 mg with Zetia 10 mg.  He continues to be on dual antiplatelet therapy with aspirin and Brilinta.  He has been taking over-the-counter fish oil.  He has a history of markedly triglycerides and was as high as 1200 several years ago.  When I saw him I was concerned about obstructive sleep apnea in the etiology of his early a.m. MI which awakened him from sleep, suggesting the potential for oxygen desaturation during grams sleep.  I referred him for a  sleep study which confirmed my suspicion.  He was found to have severe obstructive sleep apnea with an AHI of 46.8 per hour.  Events were more severe with supine sleep.  He had abnormal sleep architecture with absence of slow-wave sleep and in rem sleep.  On the diagnostic portion of the study.  With CPAP.  He had significant rim rebound.  He had loud snoring.  During the diagnostic portion of the study.  He'll total.  He was titrated up to 10 cm water pressure has been on CPAP therapy since.  A download was obtained from his area 10 [facility] CPAP unit from 05/12/2016 through October 2 22 2017.  He is meeting compliance standards.  Usage was 93%.  There were some days where the patient has awakened and his mask was off, but he did not recall taking it off.  At 10 cm water pressure.  AHI is excellent at 1.7 per hour.  He is unaware of any breakthrough snoring.  His sleep is now restorative.  Previously he had significant nocturia, which has essentially resolved.  He continues to have100% CPAP compliance.  He is awaiting a new mask since his current one has developed a significant leak.  He has cervical disc disease and will need to undergo C4-C6 cervical disc surgery by Dr. Nudleman.  When I had seen him previously, I recommended that he wait at least one year following his acute coronary syndrome so   that   dual platelet therapy be continued for one-year duration following his ACS.  When I saw him 2 months ago, I scheduled him for a preoperative nuclear perfusion study.  This was done on February 1 18.  Ejection fraction was 54%.  There was a medium defect with mild ischemia in the basal inferior, mid inferior location.  I had recommended that he return to discuss this result.  He could not make the scheduled date that was initially recommended.  He comes to the office today, 2 months later for reevaluation.  He states he will require cervical neck surgery and has noticed slight progression of his left arm  paresthesias.  He denies chest pain.  He never had chest pain prior to his MI.  He has noticed some mild exertional dyspnea.  He presents for evaluation.  Past Medical History:  Diagnosis Date  . Cervical radiculopathy at C5 01/27/2016   left  . Coronary artery disease 08/2015  . Hyperlipidemia LDL goal <70   . Hypertension   . Hypertriglyceridemia   . Left arm weakness   . Myocardial infarction 08/2015    Past Surgical History:  Procedure Laterality Date  . CARDIAC CATHETERIZATION N/A 09/15/2015   Procedure: Left Heart Cath and Coronary Angiography;  Surgeon: Troy Sine, MD; LAD 30%, pRCA 30%, dRCA 30%, RPDA 99%, nl LV function   . CARDIAC CATHETERIZATION N/A 09/15/2015   Procedure: Coronary Stent Intervention;  Surgeon: Troy Sine, MD; 3.515 mm Xience Alpine DES to dRCA/RPDA>> 5% residual     . TONSILLECTOMY      Allergies  Allergen Reactions  . Amoxicillin Rash  . Penicillins Rash    Current Outpatient Prescriptions  Medication Sig Dispense Refill  . amLODipine (NORVASC) 5 MG tablet Take 1 tablet (5 mg total) by mouth daily. 90 tablet 3  . aspirin EC 81 MG EC tablet Take 1 tablet (81 mg total) by mouth daily.    Marland Kitchen escitalopram (LEXAPRO) 10 MG tablet Take 10 mg by mouth daily.    Vanessa Kick Ethyl (VASCEPA) 1 g CAPS Take 2 capsules by mouth 2 (two) times daily. 360 capsule 3  . metoprolol (LOPRESSOR) 50 MG tablet Take 1.5 tablets (75 mg total) by mouth 2 (two) times daily. (Patient taking differently: Take 50 mg by mouth 2 (two) times daily. ) 90 tablet 11  . nitroGLYCERIN (NITROSTAT) 0.4 MG SL tablet Place 1 tablet (0.4 mg total) under the tongue every 5 (five) minutes as needed for chest pain (CP or SOB). 25 tablet 12  . rosuvastatin (CRESTOR) 40 MG tablet Take 1 tablet (40 mg total) by mouth daily. 90 tablet 3  . ticagrelor (BRILINTA) 90 MG TABS tablet Take 1 tablet (90 mg total) by mouth 2 (two) times daily. 60 tablet 11  . valsartan (DIOVAN) 320 MG tablet Take 1  tablet (320 mg total) by mouth daily. 90 tablet 3  . vitamin C (ASCORBIC ACID) 500 MG tablet Take 500 mg by mouth 2 (two) times daily.     No current facility-administered medications for this visit.     Social History   Social History  . Marital status: Legally Separated    Spouse name: N/A  . Number of children: 3  . Years of education: Some Coll   Occupational History  . Produce Social research officer, government   Social History Main Topics  . Smoking status: Never Smoker  . Smokeless tobacco: Never Used  . Alcohol use 0.0 oz/week  Comment: Rarely  . Drug use: No  . Sexual activity: Yes   Other Topics Concern  . Not on file   Social History Narrative   Multiple family members on his father's side have/had CAD at a young age, uncles and grandparents.   Lives at home with his wife and three children.   Right-handed.   2-3 cups caffeine per week.   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 He his wife have separated for the past month.  They have 3 children who he still able to see frequently.    Family History  Problem Relation Age of Onset  . Heart attack Father 83    approx age of first stent  . Stroke Father   . Hypertension Father   . Stroke Paternal Grandmother   . Hypertension Paternal Grandmother   . Hypertension Maternal Grandmother   . Hypertension Maternal Grandfather   . Diabetes Maternal Grandfather   . Hypertension Paternal Grandfather   . Diabetes Paternal Grandfather   . Heart disease Paternal Grandfather   . Skin cancer Mother   . Hyperlipidemia Sister     ROS General: Negative; No fevers, chills, or night sweats HEENT: Negative; No changes in vision or hearing, sinus congestion, difficulty swallowing Pulmonary: Negative; No cough, wheezing, shortness of breath, hemoptysis Cardiovascular: See HPI: No chest pain, presyncope, syncope, palpatations GI: Negative; No nausea, vomiting, diarrhea, or abdominal pain GU:  Negative; No dysuria, hematuria, or difficulty voiding Musculoskeletal: He is in need for cervical C3-6 surgery.   Hematologic: Negative; no easy bruising, bleeding Endocrine: Negative; no heat/cold intolerance; no diabetes, Neuro: Negative; no changes in balance, headaches Skin: Negative; No rashes or skin lesions Psychiatric: Negative; No behavioral problems, depression Sleep: Positive for snoring, and occasional daytime sleepiness, Now on CPAP therapy with resolution of symptoms; no bruxism, restless legs, hypnogognic hallucinations. Other comprehensive 14 point system review is negative   Physical Exam BP 114/78   Pulse 62   Ht _0  (1.753 m)   Wt 221 lb (100.2 kg)   BMI 32.64 kg/m    Repeat blood pressure by me 122/78.  Wt Readings from Last 3 Encounters:  11/20/16 221 lb (100.2 kg)  09/20/16 233 lb (105.7 kg)  09/13/16 242 lb (109.8 kg)   General: Alert, oriented, no distress.  Skin: normal turgor, no rashes, warm and dry HEENT: Normocephalic, atraumatic. Pupils equal round and reactive to light; sclera anicteric; extraocular muscles intact, No lid lag; Nose without nasal septal hypertrophy; Mouth/Parynx benign; Mallinpatti scale 3 Neck: No JVD, no carotid bruits; normal carotid upstroke Lungs: clear to ausculatation and percussion bilaterally; no wheezing or rales, normal inspiratory and expiratory effort Chest wall: without tenderness to palpitation Heart: PMI not displaced, RRR, s1 s2 normal, Faint 1/6 systolic murmur, No diastolic murmur, no rubs, gallops, thrills, or heaves Abdomen: soft, nontender; no hepatosplenomehaly, BS+; abdominal aorta nontender and not dilated by palpation. Back: no CVA tenderness Pulses: 2+ ;right wrist catheterization site stable Musculoskeletal: full range of motion, normal strength, no joint deformities Extremities: Pulses 2+, no clubbing cyanosis or edema, Homan's sign negative  Neurologic: grossly nonfocal; Cranial nerves grossly  wnl Psychologic: Normal mood and affect  ECG (independently read by me): Normal sinus rhythm at 63 bpm.  Mild T wave inversion in III.  January 2018 ECG (independently read by me): Normal sinus rhythm at 78 bpm.  PR interval 142 ms, QTc interval 435 ms.  No ECG evidence for his prior  myocardial infarction.  October 2017 ECG (independently read by me): Normal sinus rhythm at 91 bpm.  No ectopy.  Normal intervals.  May 2017 ECG (independently read by me): Normal sinus rhythm at 73 bpm.  Small nondiagnostic inferior Q waves with T-wave inversion.  LABS:  BMP Latest Ref Rng & Units 09/16/2015 09/15/2015 09/15/2015  Glucose 65 - 99 mg/dL 133(H) 166(H) 144(H)  BUN 6 - 20 mg/dL 8 14 13  Creatinine 0.61 - 1.24 mg/dL 0.81 0.70 0.84  Sodium 135 - 145 mmol/L 137 139 140  Potassium 3.5 - 5.1 mmol/L 3.9 3.9 4.5  Chloride 101 - 111 mmol/L 104 105 101  CO2 22 - 32 mmol/L 20(L) - 25  Calcium 8.9 - 10.3 mg/dL 8.7(L) - 9.3     Hepatic Function Latest Ref Rng & Units 09/15/2015 03/08/2014  Total Protein 6.5 - 8.1 g/dL 6.4(L) 7.1  Albumin 3.5 - 5.0 g/dL 4.0 4.5  AST 15 - 41 U/L 36 31  ALT 17 - 63 U/L 54 53  Alk Phosphatase 38 - 126 U/L 66 84  Total Bilirubin 0.3 - 1.2 mg/dL 0.8 0.3    CBC Latest Ref Rng & Units 09/16/2015 09/15/2015 09/15/2015  WBC 4.0 - 10.5 K/uL 10.9(H) - 14.1(H)  Hemoglobin 13.0 - 17.0 g/dL 13.7 16.0 16.3  Hematocrit 39.0 - 52.0 % 40.8 47.0 48.6  Platelets 150 - 400 K/uL 223 - 249   Lab Results  Component Value Date   MCV 82.1 09/16/2015   MCV 81.8 09/15/2015   MCV 80.4 03/08/2014    Lab Results  Component Value Date   TSH 5.921 (H) 09/17/2015    BNP No results found for: BNP  ProBNP No results found for: PROBNP   Lipid Panel     Component Value Date/Time   CHOL 216 (H) 09/16/2015 0335   TRIG 635 (H) 09/16/2015 0335   HDL 32 (L) 09/16/2015 0335   CHOLHDL 6.8 09/16/2015 0335   VLDL UNABLE TO CALCULATE IF TRIGLYCERIDE OVER 400 mg/dL 09/16/2015 0335   LDLCALC  UNABLE TO CALCULATE IF TRIGLYCERIDE OVER 400 mg/dL 09/16/2015 0335     RADIOLOGY: No results found.  IMPRESSION:  1. ST elevation (STEMI) myocardial infarction involving right coronary artery (HCC)   2. Stented coronary artery   3. Abnormal nuclear stress test   4. Pre-procedure lab exam   5. Anticoagulant long-term use   6. OSA on CPAP   7. Preoperative clearance     ASSESSMENT AND PLAN: Mr. Margues Millspaugh is a 40-year-old male who had a history of previously untreated hyperlipidemia, as well as hypertension.  He was awakened from sleep on the morning of 09/15/2015 with chest pain and was found to have inferior ST elevation.  He underwent successful emergent catheterization and intervention to an ulcerated plaque in the RCA proximal to the PDA takeoff and there was evidence for distal embolization of clot to the PDA.  He underwent successful stenting at the site of the ulcerated plaque and PTCA in the distal PDA.  Since his MI awakened him from sleep when I saw him in follow-up I was concerned about sleep apnea which may have been contributory to his symptomatology.  This may have contributed to increased inflammation with ultimate plaque rupture during potential hypoxemia.  He was found to have severe obstructive sleep apnea with an AHI of 46.8 per hour.  He has been started on CPAP therapy and notes significant benefit in his alertness, his sleep is nonrestorative, he is not snoring, nocturia   has significantly decreased, and he feels significantly better.  He admits to 100% compliance use.  His AHI was  excellent at 1.7 with treatment.  He is using a nasal mask and [employer] is his DME company. When I subsequently saw him, his blood pressure was elevated and  I added amlodipine to his beta blocker and ARB regimen and later I further titrated metoprolol to 75 mg twice a day.  His blood pressure today is well controlled  on a medical regimen consisting of amlodipine 5 mg, metoprolol 75 mm twice a  day, and valsartan 320 mg  daily.  He continues to be on dual antiplatelet therapy with aspirin and Brilinta.  He is on Crestor 40 mg daily for hyperlipidemia with target LDL less than 70.  He underwent his 1 year follow-up nuclear perfusion study which said mild inferior ischemia and which was seen on all 3 tomographic views.  Although he has not had chest pain, he has experienced exertional dyspnea.  Prior to undergoing cervical neck surgery, I have recommended definitive repeat cardiac catheterization.  Based on his schedule, this will be scheduled for next week on 11/28/2016 for definitive evaluation.  We discussed the possibility of needing repeat intervention, which may make it difficult initially to hold antiplatelet therapy depending upon if another stent is necessary for imminent neck surgery.  Further recommendations were made upon the catheterization evaluation.  Completes her laboratory will be obtained prior to his catheterization study.  He is aware the risk, benefits of the procedure and wishes to proceed.  Time spent: 25 minutes  Troy Sine, MD, [facility]  11/20/2016 5:43 PM

## 2016-11-23 DIAGNOSIS — G4733 Obstructive sleep apnea (adult) (pediatric): Secondary | ICD-10-CM | POA: Diagnosis not present

## 2016-11-26 ENCOUNTER — Telehealth: Payer: Self-pay | Admitting: Cardiovascular Disease

## 2016-11-26 DIAGNOSIS — Z7901 Long term (current) use of anticoagulants: Secondary | ICD-10-CM | POA: Diagnosis not present

## 2016-11-26 DIAGNOSIS — Z01812 Encounter for preprocedural laboratory examination: Secondary | ICD-10-CM | POA: Diagnosis not present

## 2016-11-26 LAB — CBC
HEMATOCRIT: 44.2 % (ref 38.5–50.0)
Hemoglobin: 14.8 g/dL (ref 13.2–17.1)
MCH: 27.4 pg (ref 27.0–33.0)
MCHC: 33.5 g/dL (ref 32.0–36.0)
MCV: 81.7 fL (ref 80.0–100.0)
MPV: 9.6 fL (ref 7.5–12.5)
Platelets: 238 10*3/uL (ref 140–400)
RBC: 5.41 MIL/uL (ref 4.20–5.80)
RDW: 14.7 % (ref 11.0–15.0)
WBC: 8 10*3/uL (ref 3.8–10.8)

## 2016-11-26 NOTE — Telephone Encounter (Signed)
Returned the phone call to the patient. He stated that he wants to go to [facility] in [place]. A call was placed to [facility] and they confirmed that they have the orders.

## 2016-11-26 NOTE — Telephone Encounter (Signed)
New Message     Pt lost his lab work orders, please fax them to [facility] in [place] , so he can have them done

## 2016-11-27 ENCOUNTER — Encounter (HOSPITAL_COMMUNITY): Payer: Self-pay | Admitting: *Deleted

## 2016-11-27 ENCOUNTER — Other Ambulatory Visit: Payer: Self-pay | Admitting: Neurosurgery

## 2016-11-27 ENCOUNTER — Ambulatory Visit
Admission: RE | Admit: 2016-11-27 | Discharge: 2016-11-27 | Disposition: A | Discharge status: Home / Self Care | Payer: BLUE CROSS/BLUE SHIELD | Source: Ambulatory Visit | Attending: Cardiovascular Disease | Admitting: Cardiovascular Disease

## 2016-11-27 DIAGNOSIS — Z8679 Personal history of other diseases of the circulatory system: Secondary | ICD-10-CM | POA: Diagnosis not present

## 2016-11-27 DIAGNOSIS — Z01810 Encounter for preprocedural cardiovascular examination: Secondary | ICD-10-CM | POA: Diagnosis not present

## 2016-11-27 DIAGNOSIS — F4321 Adjustment disorder with depressed mood: Secondary | ICD-10-CM | POA: Diagnosis not present

## 2016-11-27 DIAGNOSIS — G4733 Obstructive sleep apnea (adult) (pediatric): Secondary | ICD-10-CM | POA: Diagnosis not present

## 2016-11-27 DIAGNOSIS — R9439 Abnormal result of other cardiovascular function study: Secondary | ICD-10-CM

## 2016-11-27 DIAGNOSIS — Z01812 Encounter for preprocedural laboratory examination: Secondary | ICD-10-CM

## 2016-11-27 DIAGNOSIS — F5221 Male erectile disorder: Secondary | ICD-10-CM | POA: Diagnosis not present

## 2016-11-27 DIAGNOSIS — I2510 Atherosclerotic heart disease of native coronary artery without angina pectoris: Secondary | ICD-10-CM | POA: Diagnosis not present

## 2016-11-27 LAB — BASIC METABOLIC PANEL
BUN: 23 mg/dL (ref 7–25)
CO2: 23 mmol/L (ref 20–31)
CREATININE: 0.77 mg/dL (ref 0.60–1.35)
Calcium: 9.1 mg/dL (ref 8.6–10.3)
Chloride: 107 mmol/L (ref 98–110)
GLUCOSE: 89 mg/dL (ref 65–99)
Potassium: 5.5 mmol/L — ABNORMAL HIGH (ref 3.5–5.3)
Sodium: 139 mmol/L (ref 135–146)

## 2016-11-27 LAB — PROTIME-INR
INR: 1.1
Prothrombin Time: 11.5 s (ref 9.0–11.5)

## 2016-11-27 LAB — APTT: aPTT: 28 s (ref 22–34)

## 2016-11-28 ENCOUNTER — Encounter (HOSPITAL_COMMUNITY)
Admission: RE | Disposition: A | Discharge status: Home / Self Care | Payer: Self-pay | Source: Ambulatory Visit | Attending: Cardiovascular Disease

## 2016-11-28 ENCOUNTER — Ambulatory Visit (HOSPITAL_COMMUNITY)
Admission: RE | Admit: 2016-11-28 | Discharge: 2016-11-28 | Disposition: A | Discharge status: Home / Self Care | Payer: BLUE CROSS/BLUE SHIELD | Source: Ambulatory Visit | Attending: Cardiovascular Disease | Admitting: Cardiovascular Disease

## 2016-11-28 ENCOUNTER — Encounter (HOSPITAL_COMMUNITY): Payer: Self-pay | Admitting: Cardiovascular Disease

## 2016-11-28 DIAGNOSIS — G4733 Obstructive sleep apnea (adult) (pediatric): Secondary | ICD-10-CM | POA: Diagnosis not present

## 2016-11-28 DIAGNOSIS — Z7902 Long term (current) use of antithrombotics/antiplatelets: Secondary | ICD-10-CM | POA: Insufficient documentation

## 2016-11-28 DIAGNOSIS — I2510 Atherosclerotic heart disease of native coronary artery without angina pectoris: Secondary | ICD-10-CM | POA: Insufficient documentation

## 2016-11-28 DIAGNOSIS — Z7901 Long term (current) use of anticoagulants: Secondary | ICD-10-CM | POA: Diagnosis not present

## 2016-11-28 DIAGNOSIS — M50121 Cervical disc disorder at C4-C5 level with radiculopathy: Secondary | ICD-10-CM | POA: Diagnosis not present

## 2016-11-28 DIAGNOSIS — I2582 Chronic total occlusion of coronary artery: Secondary | ICD-10-CM | POA: Insufficient documentation

## 2016-11-28 DIAGNOSIS — R9439 Abnormal result of other cardiovascular function study: Secondary | ICD-10-CM

## 2016-11-28 DIAGNOSIS — Z880 Allergy status to penicillin: Secondary | ICD-10-CM | POA: Diagnosis not present

## 2016-11-28 DIAGNOSIS — Z8249 Family history of ischemic heart disease and other diseases of the circulatory system: Secondary | ICD-10-CM | POA: Insufficient documentation

## 2016-11-28 DIAGNOSIS — I10 Essential (primary) hypertension: Secondary | ICD-10-CM | POA: Diagnosis not present

## 2016-11-28 DIAGNOSIS — I252 Old myocardial infarction: Secondary | ICD-10-CM | POA: Diagnosis not present

## 2016-11-28 DIAGNOSIS — E781 Pure hyperglyceridemia: Secondary | ICD-10-CM | POA: Insufficient documentation

## 2016-11-28 DIAGNOSIS — Z955 Presence of coronary angioplasty implant and graft: Secondary | ICD-10-CM | POA: Insufficient documentation

## 2016-11-28 DIAGNOSIS — Z7982 Long term (current) use of aspirin: Secondary | ICD-10-CM | POA: Insufficient documentation

## 2016-11-28 DIAGNOSIS — Z823 Family history of stroke: Secondary | ICD-10-CM | POA: Diagnosis not present

## 2016-11-28 HISTORY — PX: LEFT HEART CATH AND CORONARY ANGIOGRAPHY: CATH118249

## 2016-11-28 LAB — POTASSIUM: POTASSIUM: 4 mmol/L (ref 3.5–5.1)

## 2016-11-28 SURGERY — LEFT HEART CATH AND CORONARY ANGIOGRAPHY
Anesthesia: LOCAL

## 2016-11-28 MED ORDER — LIDOCAINE HCL (PF) 1 % IJ SOLN
INTRAMUSCULAR | Status: AC
  Filled 2016-11-28: qty 30

## 2016-11-28 MED ORDER — ASPIRIN 81 MG PO CHEW
CHEWABLE_TABLET | ORAL | Status: AC
  Filled 2016-11-28: qty 1

## 2016-11-28 MED ORDER — MIDAZOLAM HCL 2 MG/2ML IJ SOLN
INTRAMUSCULAR | Status: AC
  Filled 2016-11-28: qty 2

## 2016-11-28 MED ORDER — SODIUM CHLORIDE 0.9 % WEIGHT BASED INFUSION: 1.0000 mL/kg/h | INTRAVENOUS | Status: DC

## 2016-11-28 MED ORDER — HEPARIN (PORCINE) IN NACL 2-0.9 UNIT/ML-% IJ SOLN
INTRAMUSCULAR | Status: DC | PRN
  Administered 2016-11-28: 1000 mL

## 2016-11-28 MED ORDER — HEPARIN SODIUM (PORCINE) 1000 UNIT/ML IJ SOLN
INTRAMUSCULAR | Status: AC
  Filled 2016-11-28: qty 1

## 2016-11-28 MED ORDER — ONDANSETRON HCL 4 MG/2ML IJ SOLN: 4.0000 mg | Freq: Four times a day (QID) | INTRAMUSCULAR | Status: DC | PRN

## 2016-11-28 MED ORDER — FENTANYL CITRATE (PF) 100 MCG/2ML IJ SOLN
INTRAMUSCULAR | Status: AC
  Filled 2016-11-28: qty 2

## 2016-11-28 MED ORDER — DIAZEPAM 5 MG PO TABS: 5.0000 mg | ORAL_TABLET | Freq: Four times a day (QID) | ORAL | Status: DC | PRN

## 2016-11-28 MED ORDER — ACETAMINOPHEN 325 MG PO TABS: 650.0000 mg | ORAL_TABLET | ORAL | Status: DC | PRN

## 2016-11-28 MED ORDER — HEPARIN (PORCINE) IN NACL 2-0.9 UNIT/ML-% IJ SOLN
INTRAMUSCULAR | Status: AC
  Filled 2016-11-28: qty 1000

## 2016-11-28 MED ORDER — HEPARIN SODIUM (PORCINE) 1000 UNIT/ML IJ SOLN
INTRAMUSCULAR | Status: DC | PRN
  Administered 2016-11-28: 5000 [IU] via INTRAVENOUS

## 2016-11-28 MED ORDER — IOPAMIDOL (ISOVUE-370) INJECTION 76%
INTRAVENOUS | Status: AC
  Filled 2016-11-28: qty 100

## 2016-11-28 MED ORDER — MIDAZOLAM HCL 2 MG/2ML IJ SOLN
INTRAMUSCULAR | Status: DC | PRN
  Administered 2016-11-28: 2 mg via INTRAVENOUS

## 2016-11-28 MED ORDER — SODIUM CHLORIDE 0.9% FLUSH: 3.0000 mL | Freq: Two times a day (BID) | INTRAVENOUS | Status: DC

## 2016-11-28 MED ORDER — SODIUM CHLORIDE 0.9 % IV SOLN: 250.0000 mL | INTRAVENOUS | Status: DC | PRN

## 2016-11-28 MED ORDER — SODIUM CHLORIDE 0.9 % IV SOLN: INTRAVENOUS | Status: DC

## 2016-11-28 MED ORDER — FENTANYL CITRATE (PF) 100 MCG/2ML IJ SOLN
INTRAMUSCULAR | Status: DC | PRN
  Administered 2016-11-28: 50 ug via INTRAVENOUS

## 2016-11-28 MED ORDER — HEPARIN (PORCINE) IN NACL 2-0.9 UNIT/ML-% IJ SOLN
INTRAMUSCULAR | Status: DC | PRN
  Administered 2016-11-28: 10 mL via INTRA_ARTERIAL

## 2016-11-28 MED ORDER — VERAPAMIL HCL 2.5 MG/ML IV SOLN
INTRAVENOUS | Status: AC
  Filled 2016-11-28: qty 2

## 2016-11-28 MED ORDER — SODIUM CHLORIDE 0.9% FLUSH: 3.0000 mL | INTRAVENOUS | Status: DC | PRN

## 2016-11-28 MED ORDER — ASPIRIN 81 MG PO CHEW
81.0000 mg | CHEWABLE_TABLET | ORAL | Status: AC
  Administered 2016-11-28: 81 mg via ORAL

## 2016-11-28 MED ORDER — LIDOCAINE HCL (PF) 1 % IJ SOLN
INTRAMUSCULAR | Status: DC | PRN
  Administered 2016-11-28: 2 mL

## 2016-11-28 MED ORDER — SODIUM CHLORIDE 0.9 % WEIGHT BASED INFUSION
3.0000 mL/kg/h | INTRAVENOUS | Status: AC
  Administered 2016-11-28: 3 mL/kg/h via INTRAVENOUS

## 2016-11-28 MED ORDER — IOPAMIDOL (ISOVUE-370) INJECTION 76%
INTRAVENOUS | Status: DC | PRN
  Administered 2016-11-28: 60 mL via INTRA_ARTERIAL

## 2016-11-28 SURGICAL SUPPLY — 12 items
CATH INFINITI 5FR ANG PIGTAIL (CATHETERS) ×2 IMPLANT
CATH INFINITI JR4 5F (CATHETERS) ×2 IMPLANT
CATH OPTITORQUE TIG 4.0 5F (CATHETERS) ×2 IMPLANT
DEVICE RAD COMP TR BAND LRG (VASCULAR PRODUCTS) ×2 IMPLANT
GLIDESHEATH SLEND SS 6F .021 (SHEATH) ×2 IMPLANT
GUIDEWIRE INQWIRE 1.5J.035X260 (WIRE) ×1 IMPLANT
INQWIRE 1.5J .035X260CM (WIRE) ×2
KIT HEART LEFT (KITS) ×2 IMPLANT
PACK CARDIAC CATHETERIZATION (CUSTOM PROCEDURE TRAY) ×2 IMPLANT
SYR MEDRAD MARK V 150ML (SYRINGE) ×2 IMPLANT
TRANSDUCER W/STOPCOCK (MISCELLANEOUS) ×2 IMPLANT
TUBING CIL FLEX 10 FLL-RA (TUBING) ×2 IMPLANT

## 2016-11-28 NOTE — Interval H&P Note (Signed)
Cath Lab Visit (complete for each Cath Lab visit)  Clinical Evaluation Leading to the Procedure:   ACS: No.  Non-ACS:    Anginal Classification: CCS II  Anti-ischemic medical therapy: Maximal Therapy (2 or more classes of medications)  Non-Invasive Test Results: Intermediate-risk stress test findings: cardiac mortality 1-3%/year  Prior CABG: No previous CABG      History and Physical Interval Note:  11/28/2016 8:28 AM  Richard Hull  has presented today for surgery, with the diagnosis of abnormal stress test  The various methods of treatment have been discussed with the patient and family. After consideration of risks, benefits and other options for treatment, the patient has consented to  Procedure(s): Left Heart Cath and Coronary Angiography (N/A) as a surgical intervention .  The patient's history has been reviewed, patient examined, no change in status, stable for surgery.  I have reviewed the patient's chart and labs.  Questions were answered to the patient's satisfaction.     Nicki Guadalajara

## 2016-11-28 NOTE — Discharge Instructions (Signed)
Radial Site Care °Refer to this sheet in the next few weeks. These instructions provide you with information about caring for yourself after your procedure. Your health care provider may also give you more specific instructions. Your treatment has been planned according to current medical practices, but problems sometimes occur. Call your health care provider if you have any problems or questions after your procedure. °What can I expect after the procedure? °After your procedure, it is typical to have the following: °· Bruising at the radial site that usually fades within 1-2 weeks. °· Blood collecting in the tissue (hematoma) that may be painful to the touch. It should usually decrease in size and tenderness within 1-2 weeks. °Follow these instructions at home: °· Take medicines only as directed by your health care provider. °· You may shower 24-48 hours after the procedure or as directed by your health care provider. Remove the bandage (dressing) and gently wash the site with plain soap and water. Pat the area dry with a clean towel. Do not rub the site, because this may cause bleeding. °· Do not take baths, swim, or use a hot tub until your health care provider approves. °· Check your insertion site every day for redness, swelling, or drainage. °· Do not apply powder or lotion to the site. °· Do not flex or bend the affected arm for 24 hours or as directed by your health care provider. °· Do not push or pull heavy objects with the affected arm for 24 hours or as directed by your health care provider. °· Do not lift over 10 lb (4.5 kg) for 5 days after your procedure or as directed by your health care provider. °· Ask your health care provider when it is okay to: °¨ Return to work or school. °¨ Resume usual physical activities or sports. °¨ Resume sexual activity. °· Do not drive home if you are discharged the same day as the procedure. Have someone else drive you. °· You may drive 24 hours after the procedure  unless otherwise instructed by your health care provider. °· Do not operate machinery or power tools for 24 hours after the procedure. °· If your procedure was done as an outpatient procedure, which means that you went home the same day as your procedure, a responsible adult should be with you for the first 24 hours after you arrive home. °· Keep all follow-up visits as directed by your health care provider. This is important. °Contact a health care provider if: °· You have a fever. °· You have chills. °· You have increased bleeding from the radial site. Hold pressure on the site. °Get help right away if: °· You have unusual pain at the radial site. °· You have redness, warmth, or swelling at the radial site. °· You have drainage (other than a small amount of blood on the dressing) from the radial site. °· The radial site is bleeding, and the bleeding does not stop after 30 minutes of holding steady pressure on the site. °· Your arm or hand becomes pale, cool, tingly, or numb. °This information is not intended to replace advice given to you by your health care provider. Make sure you discuss any questions you have with your health care provider. °Document Released: 09/08/2010 Document Revised: 01/12/2016 Document Reviewed: 02/22/2014 °Elsevier Interactive Patient Education © 2017 [employer] ° °

## 2016-11-28 NOTE — H&P (View-Only) (Signed)
Patient ID: Richard Hull, male   DOB: May 16, 1976, 41 y.o.   MRN: [id number]    Primary M.D.: Dr. Shon Baton  HPI: Richard Hull is a 41 y.o. male who presents to the office today for a 2 month follow up cardiology evaluation.  [name] has a history of hyperlipidemia as well as hypertension.  On the morning of 09/15/2015 he was awakened from sleep with severe substernal chest pressure.  He presented to [facility] hospital emergency room where his ECG showed sinus tachycardia with inferior ST segment elevation.  A code STEMI was activated.  He underwent emergent cardiac catheterization by me, which revealed an ulcerated plaque in a large large dominant RCA with distal embolization to the PDA.  Intervention was performed and he ultimately underwent stenting of his RCA proximal to the PDA with insertion of a 3.515 mm Xience Alpine DES stent postdilated 3.75 mm and PTCA of the subtotal mid PDA, which was small caliber and dilated with a 20 balloon.  He was found to have mild concomitant CAD with 30% narrowing in the mid to distal LAD and proximal 30% stenosis.  A subsequent echo done on 09/18/2015 showed an EF of 65-70% without wall motion abnormality.  There was grade 1 diastolic dysfunction.  He was seen for post hospital initial evaluation with [name].  He had developed a dry cough, which was felt to be due to lisinopril, but this ultimately resolved and he has been maintained on lisinopril 40 mg daily, metoprolol 50 mg twice a day, atorvastatin 80 mg with Zetia 10 mg.  He continues to be on dual antiplatelet therapy with aspirin and Brilinta.  He has been taking over-the-counter fish oil.  He has a history of markedly triglycerides and was as high as 1200 several years ago.  When I saw him I was concerned about obstructive sleep apnea in the etiology of his early a.m. MI which awakened him from sleep, suggesting the potential for oxygen desaturation during grams sleep.  I referred him for a  sleep study which confirmed my suspicion.  He was found to have severe obstructive sleep apnea with an AHI of 46.8 per hour.  Events were more severe with supine sleep.  He had abnormal sleep architecture with absence of slow-wave sleep and in rem sleep.  On the diagnostic portion of the study.  With CPAP.  He had significant rim rebound.  He had loud snoring.  During the diagnostic portion of the study.  He'll total.  He was titrated up to 10 cm water pressure has been on CPAP therapy since.  A download was obtained from his area 10 Auto[employer] ResMed CPAP unit from 05/12/2016 through October 2 22 2017.  He is meeting compliance standards.  Usage was 93%.  There were some days where the patient has awakened and his mask was off, but he did not recall taking it off.  At 10 cm water pressure.  AHI is excellent at 1.7 per hour.  He is unaware of any breakthrough snoring.  His sleep is now restorative.  Previously he had significant nocturia, which has essentially resolved.  He continues to have100% CPAP compliance.  He is awaiting a new mask since his current one has developed a significant leak.  He has cervical disc disease and will need to undergo C4-C6 cervical disc surgery by Dr. Rita Ohara.  When I had seen him previously, I recommended that he wait at least one year following his acute coronary syndrome so  that   dual platelet therapy be continued for one-year duration following his ACS.  When I saw him 2 months ago, I scheduled him for a preoperative nuclear perfusion study.  This was done on February 1 18.  Ejection fraction was 54%.  There was a medium defect with mild ischemia in the basal inferior, mid inferior location.  I had recommended that he return to discuss this result.  He could not make the scheduled date that was initially recommended.  He comes to the office today, 2 months later for reevaluation.  He states he will require cervical neck surgery and has noticed slight progression of his left arm  paresthesias.  He denies chest pain.  He never had chest pain prior to his MI.  He has noticed some mild exertional dyspnea.  He presents for evaluation.  Past Medical History:  Diagnosis Date  . Cervical radiculopathy at C5 01/27/2016   left  . Coronary artery disease 08/2015  . Hyperlipidemia LDL goal <70   . Hypertension   . Hypertriglyceridemia   . Left arm weakness   . Myocardial infarction 08/2015    Past Surgical History:  Procedure Laterality Date  . CARDIAC CATHETERIZATION N/A 09/15/2015   Procedure: Left Heart Cath and Coronary Angiography;  Surgeon: Troy Sine, MD; LAD 30%, pRCA 30%, dRCA 30%, RPDA 99%, nl LV function   . CARDIAC CATHETERIZATION N/A 09/15/2015   Procedure: Coronary Stent Intervention;  Surgeon: Troy Sine, MD; 3.515 mm Xience Alpine DES to dRCA/RPDA>> 5% residual     . TONSILLECTOMY      Allergies  Allergen Reactions  . Amoxicillin Rash  . Penicillins Rash    Current Outpatient Prescriptions  Medication Sig Dispense Refill  . amLODipine (NORVASC) 5 MG tablet Take 1 tablet (5 mg total) by mouth daily. 90 tablet 3  . aspirin EC 81 MG EC tablet Take 1 tablet (81 mg total) by mouth daily.    Marland Kitchen escitalopram (LEXAPRO) 10 MG tablet Take 10 mg by mouth daily.    Vanessa Kick Ethyl (VASCEPA) 1 g CAPS Take 2 capsules by mouth 2 (two) times daily. 360 capsule 3  . metoprolol (LOPRESSOR) 50 MG tablet Take 1.5 tablets (75 mg total) by mouth 2 (two) times daily. (Patient taking differently: Take 50 mg by mouth 2 (two) times daily. ) 90 tablet 11  . nitroGLYCERIN (NITROSTAT) 0.4 MG SL tablet Place 1 tablet (0.4 mg total) under the tongue every 5 (five) minutes as needed for chest pain (CP or SOB). 25 tablet 12  . rosuvastatin (CRESTOR) 40 MG tablet Take 1 tablet (40 mg total) by mouth daily. 90 tablet 3  . ticagrelor (BRILINTA) 90 MG TABS tablet Take 1 tablet (90 mg total) by mouth 2 (two) times daily. 60 tablet 11  . valsartan (DIOVAN) 320 MG tablet Take 1  tablet (320 mg total) by mouth daily. 90 tablet 3  . vitamin C (ASCORBIC ACID) 500 MG tablet Take 500 mg by mouth 2 (two) times daily.     No current facility-administered medications for this visit.     Social History   Social History  . Marital status: Legally Separated    Spouse name: N/A  . Number of children: 3  . Years of education: Some Coll   Occupational History  . Produce Social research officer, government   Social History Main Topics  . Smoking status: Never Smoker  . Smokeless tobacco: Never Used  . Alcohol use 0.0 oz/week  Comment: Rarely  . Drug use: No  . Sexual activity: Yes   Other Topics Concern  . Not on file   Social History Narrative   Multiple family members on his father's side have/had CAD at a young age, uncles and grandparents.   Lives at home with his wife and three children.   Right-handed.   2-3 cups caffeine per week.   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 He his wife have separated for the past month.  They have 3 children who he still able to see frequently.    Family History  Problem Relation Age of Onset  . Heart attack Father 83    approx age of first stent  . Stroke Father   . Hypertension Father   . Stroke Paternal Grandmother   . Hypertension Paternal Grandmother   . Hypertension Maternal Grandmother   . Hypertension Maternal Grandfather   . Diabetes Maternal Grandfather   . Hypertension Paternal Grandfather   . Diabetes Paternal Grandfather   . Heart disease Paternal Grandfather   . Skin cancer Mother   . Hyperlipidemia Sister     ROS General: Negative; No fevers, chills, or night sweats HEENT: Negative; No changes in vision or hearing, sinus congestion, difficulty swallowing Pulmonary: Negative; No cough, wheezing, shortness of breath, hemoptysis Cardiovascular: See HPI: No chest pain, presyncope, syncope, palpatations GI: Negative; No nausea, vomiting, diarrhea, or abdominal pain GU:  Negative; No dysuria, hematuria, or difficulty voiding Musculoskeletal: He is in need for cervical C3-6 surgery.   Hematologic: Negative; no easy bruising, bleeding Endocrine: Negative; no heat/cold intolerance; no diabetes, Neuro: Negative; no changes in balance, headaches Skin: Negative; No rashes or skin lesions Psychiatric: Negative; No behavioral problems, depression Sleep: Positive for snoring, and occasional daytime sleepiness, Now on CPAP therapy with resolution of symptoms; no bruxism, restless legs, hypnogognic hallucinations. Other comprehensive 14 point system review is negative   Physical Exam BP 114/78   Pulse 62   Ht _0  (1.753 m)   Wt 221 lb (100.2 kg)   BMI 32.64 kg/m    Repeat blood pressure by me 122/78.  Wt Readings from Last 3 Encounters:  11/20/16 221 lb (100.2 kg)  09/20/16 233 lb (105.7 kg)  09/13/16 242 lb (109.8 kg)   General: Alert, oriented, no distress.  Skin: normal turgor, no rashes, warm and dry HEENT: Normocephalic, atraumatic. Pupils equal round and reactive to light; sclera anicteric; extraocular muscles intact, No lid lag; Nose without nasal septal hypertrophy; Mouth/Parynx benign; Mallinpatti scale 3 Neck: No JVD, no carotid bruits; normal carotid upstroke Lungs: clear to ausculatation and percussion bilaterally; no wheezing or rales, normal inspiratory and expiratory effort Chest wall: without tenderness to palpitation Heart: PMI not displaced, RRR, s1 s2 normal, Faint 1/6 systolic murmur, No diastolic murmur, no rubs, gallops, thrills, or heaves Abdomen: soft, nontender; no hepatosplenomehaly, BS+; abdominal aorta nontender and not dilated by palpation. Back: no CVA tenderness Pulses: 2+ ;right wrist catheterization site stable Musculoskeletal: full range of motion, normal strength, no joint deformities Extremities: Pulses 2+, no clubbing cyanosis or edema, Homan's sign negative  Neurologic: grossly nonfocal; Cranial nerves grossly  wnl Psychologic: Normal mood and affect  ECG (independently read by me): Normal sinus rhythm at 63 bpm.  Mild T wave inversion in III.  January 2018 ECG (independently read by me): Normal sinus rhythm at 78 bpm.  PR interval 142 ms, QTc interval 435 ms.  No ECG evidence for his prior  myocardial infarction.  October 2017 ECG (independently read by me): Normal sinus rhythm at 91 bpm.  No ectopy.  Normal intervals.  May 2017 ECG (independently read by me): Normal sinus rhythm at 73 bpm.  Small nondiagnostic inferior Q waves with T-wave inversion.  LABS:  BMP Latest Ref Rng & Units 09/16/2015 09/15/2015 09/15/2015  Glucose 65 - 99 mg/dL 133(H) 166(H) 144(H)  BUN 6 - 20 mg/dL _0 Creatinine 0.61 - 1.24 mg/dL 0.81 0.70 0.84  Sodium 135 - 145 mmol/L 137 139 140  Potassium 3.5 - 5.1 mmol/L 3.9 3.9 4.5  Chloride 101 - 111 mmol/L 104 105 101  CO2 22 - 32 mmol/L 20(L) - 25  Calcium 8.9 - 10.3 mg/dL 8.7(L) - 9.3     Hepatic Function Latest Ref Rng & Units 09/15/2015 03/08/2014  Total Protein 6.5 - 8.1 g/dL 6.4(L) 7.1  Albumin 3.5 - 5.0 g/dL 4.0 4.5  AST 15 - 41 U/L 36 31  ALT 17 - 63 U/L 54 53  Alk Phosphatase 38 - 126 U/L 66 84  Total Bilirubin 0.3 - 1.2 mg/dL 0.8 0.3    CBC Latest Ref Rng & Units 09/16/2015 09/15/2015 09/15/2015  WBC 4.0 - 10.5 K/uL 10.9(H) - 14.1(H)  Hemoglobin 13.0 - 17.0 g/dL 13.7 16.0 16.3  Hematocrit 39.0 - 52.0 % 40.8 47.0 48.6  Platelets 150 - 400 K/uL 223 - 249   Lab Results  Component Value Date   MCV 82.1 09/16/2015   MCV 81.8 09/15/2015   MCV 80.4 03/08/2014    Lab Results  Component Value Date   TSH 5.921 (H) 09/17/2015    BNP No results found for: BNP  ProBNP No results found for: PROBNP   Lipid Panel     Component Value Date/Time   CHOL 216 (H) 09/16/2015 0335   TRIG 635 (H) 09/16/2015 0335   HDL 32 (L) 09/16/2015 0335   CHOLHDL 6.8 09/16/2015 0335   VLDL UNABLE TO CALCULATE IF TRIGLYCERIDE OVER 400 mg/dL 09/16/2015 0335   LDLCALC  UNABLE TO CALCULATE IF TRIGLYCERIDE OVER 400 mg/dL 09/16/2015 0335     RADIOLOGY: No results found.  IMPRESSION:  1. ST elevation (STEMI) myocardial infarction involving right coronary artery ([place])   2. Stented coronary artery   3. Abnormal nuclear stress test   4. Pre-procedure lab exam   5. Anticoagulant long-term use   6. OSA on CPAP   7. Preoperative clearance     ASSESSMENT AND PLAN: Mr. Kaien Pezzullo is a 41 year old male who had a history of previously untreated hyperlipidemia, as well as hypertension.  He was awakened from sleep on the morning of 09/15/2015 with chest pain and was found to have inferior ST elevation.  He underwent successful emergent catheterization and intervention to an ulcerated plaque in the RCA proximal to the PDA takeoff and there was evidence for distal embolization of clot to the PDA.  He underwent successful stenting at the site of the ulcerated plaque and PTCA in the distal PDA.  Since his MI awakened him from sleep when I saw him in follow-up I was concerned about sleep apnea which may have been contributory to his symptomatology.  This may have contributed to increased inflammation with ultimate plaque rupture during potential hypoxemia.  He was found to have severe obstructive sleep apnea with an AHI of 46.8 per hour.  He has been started on CPAP therapy and notes significant benefit in his alertness, his sleep is nonrestorative, he is not snoring, nocturia  has significantly decreased, and he feels significantly better.  He admits to 100% compliance use.  His AHI was  excellent at 1.7 with treatment.  He is using a nasal mask and [employer] is his DME company. When I subsequently saw him, his blood pressure was elevated and  I added amlodipine to his beta blocker and ARB regimen and later I further titrated metoprolol to 75 mg twice a day.  His blood pressure today is well controlled  on a medical regimen consisting of amlodipine 5 mg, metoprolol 75 mm twice a  day, and valsartan 320 mg  daily.  He continues to be on dual antiplatelet therapy with aspirin and Brilinta.  He is on Crestor 40 mg daily for hyperlipidemia with target LDL less than 70.  He underwent his 1 year follow-up nuclear perfusion study which said mild inferior ischemia and which was seen on all 3 tomographic views.  Although he has not had chest pain, he has experienced exertional dyspnea.  Prior to undergoing cervical neck surgery, I have recommended definitive repeat cardiac catheterization.  Based on his schedule, this will be scheduled for next week on 11/28/2016 for definitive evaluation.  We discussed the possibility of needing repeat intervention, which may make it difficult initially to hold antiplatelet therapy depending upon if another stent is necessary for imminent neck surgery.  Further recommendations were made upon the catheterization evaluation.  Completes her laboratory will be obtained prior to his catheterization study.  He is aware the risk, benefits of the procedure and wishes to proceed.  Time spent: 25 minutes  Troy Sine, MD, [facility]  11/20/2016 5:43 PM

## 2016-12-31 ENCOUNTER — Telehealth: Payer: Self-pay | Admitting: Cardiovascular Disease

## 2016-12-31 MED ORDER — ICOSAPENT ETHYL 1 G PO CAPS: 2.0000 | ORAL_CAPSULE | Freq: Two times a day (BID) | ORAL | 11 refills | Status: DC

## 2016-12-31 NOTE — Telephone Encounter (Signed)
BRILINTA samples at the front desk, we have no vascepa samples

## 2016-12-31 NOTE — Telephone Encounter (Signed)
He's commercial insurance, should only be $9 with copay card

## 2016-12-31 NOTE — Telephone Encounter (Signed)
Pt calling to see if he can get Brilinta and Vascepa in generic-they are too expensive and he cannot afford them-pls call [phone]

## 2016-12-31 NOTE — Telephone Encounter (Signed)
Pt notified Brilinta samples at the front desk but pt would like to switch Vascepa to another medication he states this is too expensive

## 2017-01-06 ENCOUNTER — Other Ambulatory Visit: Payer: Self-pay | Admitting: Cardiovascular Disease

## 2017-02-06 ENCOUNTER — Other Ambulatory Visit: Payer: Self-pay | Admitting: Cardiovascular Disease

## 2017-02-07 ENCOUNTER — Telehealth: Payer: Self-pay | Admitting: Cardiovascular Disease

## 2017-02-07 NOTE — Telephone Encounter (Signed)
Medication samples have been provided to the patient.  Drug name: Brilinta 90 mg tabs Qty: 32 tabs LOT: [id number] Exp.Date: 04/2019  Samples left at front desk for patient pick-up. Patient notified.  Merri Brunette, CMA

## 2017-02-07 NOTE — Telephone Encounter (Signed)
Rx(s) sent to pharmacy electronically.  

## 2017-02-07 NOTE — Telephone Encounter (Signed)
New message    Patient calling the office for samples of medication:   1.  What medication and dosage are you requesting samples for? Brilinta 90 mg  2.  Are you currently out of this medication? Pt states he has 4 days left.

## 2017-02-14 DIAGNOSIS — G4733 Obstructive sleep apnea (adult) (pediatric): Secondary | ICD-10-CM | POA: Diagnosis not present

## 2017-03-11 DIAGNOSIS — M4722 Other spondylosis with radiculopathy, cervical region: Secondary | ICD-10-CM | POA: Diagnosis not present

## 2017-03-11 DIAGNOSIS — M542 Cervicalgia: Secondary | ICD-10-CM | POA: Diagnosis not present

## 2017-03-11 DIAGNOSIS — M5020 Other cervical disc displacement, unspecified cervical region: Secondary | ICD-10-CM | POA: Diagnosis not present

## 2017-03-11 DIAGNOSIS — M5030 Other cervical disc degeneration, unspecified cervical region: Secondary | ICD-10-CM | POA: Diagnosis not present

## 2017-03-11 DIAGNOSIS — M5412 Radiculopathy, cervical region: Secondary | ICD-10-CM | POA: Diagnosis not present

## 2017-03-26 DIAGNOSIS — M5020 Other cervical disc displacement, unspecified cervical region: Secondary | ICD-10-CM | POA: Diagnosis not present

## 2017-04-08 ENCOUNTER — Other Ambulatory Visit: Payer: Self-pay | Admitting: Neurosurgery

## 2017-04-08 DIAGNOSIS — M5000 Cervical disc disorder with myelopathy, unspecified cervical region: Secondary | ICD-10-CM | POA: Diagnosis not present

## 2017-04-08 DIAGNOSIS — M4712 Other spondylosis with myelopathy, cervical region: Secondary | ICD-10-CM | POA: Diagnosis not present

## 2017-04-08 DIAGNOSIS — M5030 Other cervical disc degeneration, unspecified cervical region: Secondary | ICD-10-CM | POA: Diagnosis not present

## 2017-04-08 DIAGNOSIS — M4722 Other spondylosis with radiculopathy, cervical region: Secondary | ICD-10-CM | POA: Diagnosis not present

## 2017-04-29 NOTE — Pre-Procedure Instructions (Signed)
ALCIDES NUTTING  04/29/2017      [id number] [place], [place] - [address] [place] [address] Phone: [phone] Fax: [phone]  [facility] - [place], [place] - [address] [facility] [address] [place] [address] [place] [address] Phone: [phone] Fax: [phone]  [facility] [address], [place] - [address] [place] [address] Phone: [phone] Fax: [phone]    Your procedure is scheduled on September 12  Report to [facility] Admitting at [employer] [employer] A.M.  Call this number if you have problems the morning of surgery:  [phone]   Remember:  Do not eat food or drink liquids after midnight.  Continue all other medications as directed by your physician except follow these medication instructions before surgery   Take these medicines the morning of surgery with A SIP OF WATER  amLODipine (NORVASC)   escitalopram (LEXAPRO) metoprolol (LOPRESSOR) nitroGLYCERIN (NITROSTAT) if needed  7 days prior to surgery STOP taking any Aspirin, Aleve, Naproxen, Ibuprofen, Motrin, Advil, Goody's, BC's, all herbal medications, fish oil, and all vitamins  Follow your doctors instructions regarding your Aspirin.  If no instructions were given by the doctor you will need to call the office to get instructions.  Your pre admission RN will also call for those instructions  Follow Physicians Instructions about ticagrelor (BRILINTA)     Do not wear jewelry  Do not wear lotions, powders, or cologne, or deoderant.  Men may shave face and neck.  Do not bring valuables to the hospital.  [facility] is not responsible for any belongings or valuables.  Contacts, dentures or bridgework may not be worn into surgery.  Leave your suitcase in the car.  After surgery it may be brought to your room.  For patients admitted to the hospital, discharge time will be determined by your treatment team.  Patients  discharged the day of surgery will not be allowed to drive home.   Special instructions:   [facility]- Preparing For Surgery  Before surgery, you can play an important role. Because skin is not sterile, your skin needs to be as free of germs as possible. You can reduce the number of germs on your skin by washing with CHG (chlorahexidine gluconate) Soap before surgery.  CHG is an antiseptic cleaner which kills germs and bonds with the skin to continue killing germs even after washing.  Please do not use if you have an allergy to CHG or antibacterial soaps. If your skin becomes reddened/irritated stop using the CHG.  Do not shave (including legs and underarms) for at least 48 hours prior to first CHG shower. It is OK to shave your face.  Please follow these instructions carefully.   1. Shower the NIGHT BEFORE SURGERY and the MORNING OF SURGERY with CHG.   2. If you chose to wash your hair, wash your hair first as usual with your normal shampoo.  3. After you shampoo, rinse your hair and body thoroughly to remove the shampoo.  4. Use CHG as you would any other liquid soap. You can apply CHG directly to the skin and wash gently with a scrungie or a clean washcloth.   5. Apply the CHG Soap to your body ONLY FROM THE NECK DOWN.  Do not use on open wounds or open sores. Avoid contact with your eyes, ears, mouth and genitals (private parts). Wash genitals (private parts) with your normal soap.  6. Wash thoroughly, paying special attention to the area where your surgery will be performed.  7. Thoroughly rinse your body with warm water from the neck down.  8. DO NOT shower/wash with your normal soap after using and rinsing off the CHG Soap.  9. Pat yourself dry with a CLEAN TOWEL.   10. Wear CLEAN PAJAMAS   11. Place CLEAN SHEETS on your bed the night of your first shower and DO NOT SLEEP WITH PETS.    Day of Surgery: Do not apply any deodorants/lotions. Please wear clean clothes to the  hospital/surgery center.      Please read over the following fact sheets that you were given.

## 2017-04-30 ENCOUNTER — Encounter (HOSPITAL_COMMUNITY): Payer: Self-pay

## 2017-04-30 ENCOUNTER — Encounter (HOSPITAL_COMMUNITY)
Admission: RE | Admit: 2017-04-30 | Discharge: 2017-04-30 | Disposition: A | Discharge status: Home / Self Care | Payer: BLUE CROSS/BLUE SHIELD | Source: Ambulatory Visit | Attending: Neurosurgery | Admitting: Neurosurgery

## 2017-04-30 DIAGNOSIS — I2510 Atherosclerotic heart disease of native coronary artery without angina pectoris: Secondary | ICD-10-CM | POA: Diagnosis not present

## 2017-04-30 DIAGNOSIS — Z79899 Other long term (current) drug therapy: Secondary | ICD-10-CM | POA: Diagnosis not present

## 2017-04-30 DIAGNOSIS — Z7982 Long term (current) use of aspirin: Secondary | ICD-10-CM | POA: Diagnosis not present

## 2017-04-30 DIAGNOSIS — M4722 Other spondylosis with radiculopathy, cervical region: Secondary | ICD-10-CM | POA: Diagnosis not present

## 2017-04-30 DIAGNOSIS — E782 Mixed hyperlipidemia: Secondary | ICD-10-CM | POA: Diagnosis not present

## 2017-04-30 DIAGNOSIS — Z880 Allergy status to penicillin: Secondary | ICD-10-CM | POA: Diagnosis not present

## 2017-04-30 DIAGNOSIS — M50121 Cervical disc disorder at C4-C5 level with radiculopathy: Secondary | ICD-10-CM | POA: Diagnosis not present

## 2017-04-30 DIAGNOSIS — F329 Major depressive disorder, single episode, unspecified: Secondary | ICD-10-CM | POA: Diagnosis not present

## 2017-04-30 DIAGNOSIS — K219 Gastro-esophageal reflux disease without esophagitis: Secondary | ICD-10-CM | POA: Diagnosis not present

## 2017-04-30 DIAGNOSIS — I252 Old myocardial infarction: Secondary | ICD-10-CM | POA: Diagnosis not present

## 2017-04-30 DIAGNOSIS — G4730 Sleep apnea, unspecified: Secondary | ICD-10-CM | POA: Diagnosis not present

## 2017-04-30 DIAGNOSIS — I10 Essential (primary) hypertension: Secondary | ICD-10-CM | POA: Diagnosis not present

## 2017-04-30 HISTORY — DX: Sleep apnea, unspecified: G47.30

## 2017-04-30 HISTORY — DX: Depression, unspecified: F32.A

## 2017-04-30 HISTORY — DX: Major depressive disorder, single episode, unspecified: F32.9

## 2017-04-30 HISTORY — DX: Gastro-esophageal reflux disease without esophagitis: K21.9

## 2017-04-30 HISTORY — DX: Obsessive-compulsive disorder, unspecified: F42.9

## 2017-04-30 HISTORY — DX: Personal history of urinary calculi: Z87.442

## 2017-04-30 LAB — CBC
HEMATOCRIT: 48.8 % (ref 39.0–52.0)
HEMOGLOBIN: 16.2 g/dL (ref 13.0–17.0)
MCH: 27.6 pg (ref 26.0–34.0)
MCHC: 33.2 g/dL (ref 30.0–36.0)
MCV: 83.1 fL (ref 78.0–100.0)
Platelets: 231 10*3/uL (ref 150–400)
RBC: 5.87 MIL/uL — AB (ref 4.22–5.81)
RDW: 13.7 % (ref 11.5–15.5)
WBC: 6.3 10*3/uL (ref 4.0–10.5)

## 2017-04-30 LAB — SURGICAL PCR SCREEN
MRSA, PCR: NEGATIVE
Staphylococcus aureus: POSITIVE — AB

## 2017-04-30 LAB — PROTIME-INR
INR: 0.94
Prothrombin Time: 12.5 seconds (ref 11.4–15.2)

## 2017-04-30 MED ORDER — VANCOMYCIN HCL 10 G IV SOLR
1500.0000 mg | INTRAVENOUS | Status: AC
  Administered 2017-05-01: 1500 mg via INTRAVENOUS
  Filled 2017-04-30: qty 1500

## 2017-04-30 NOTE — Progress Notes (Signed)
Received a call from main lab stating BMP sample drawn at PAT appt was "lipemic" and the quantity in sample tube was insufficient for ultracentrifuge.    Will need BMP redrawn DOS.   Pt credited for sample drawn today.

## 2017-04-30 NOTE — Progress Notes (Signed)
PCP: [facility] Cardiologist: Dr. Nicholaus Bloom- last seen April 2018  EKG:11/2016 CXR: 11/2016 ECHO: 08/2015 Stress Test: 09/2016 Cardiac Cath: 11/2016  Patient denies shortness of breath, fever, cough, and chest pain at PAT appointment.  Patient verbalized understanding of instructions provided today at the PAT appointment.  Patient asked to review instructions at home and day of surgery.   Requested pt bring his CPAP mask with him DOS.

## 2017-04-30 NOTE — Progress Notes (Signed)
Anesthesia Chart Review:  Pt is a 41 year old male scheduled for C4-5, C5-6 ACDF on 05/01/2017 with Shirlean Kelly, MD  - PCP is [name], MD - Cardiologist is Nicki Guadalajara, MD  PMH includes:  CAD (DES to RCA 09/15/15), HTN, hyperlipidemia, OSA, OCD, GERD. Never smoker. BMI 36  Medications include: Amlodipine, ASA 81 mg, irbesartan, metoprolol, rosuvastatin, Brilinta, vascepa. Last dose Brilinta 04/09/17.   Preoperative labs reviewed.  BMET sample significantly lipemic, sample size insufficient for testing. Will obtain BMET DOS.   CXR 11/27/16: There is no pneumonia, CHF, or other acute cardiopulmonary abnormality.  EKG 11/20/16: NSR. Incomplete LBBB.   Cardiac cath 11/28/16 (for abnormal stress test):   Dist RCA lesion, 0 %stenosed.  RPDA lesion, 100 %stenosed.  Ost 2nd Diag to 2nd Diag lesion, 40 %stenosed.  Mid LAD to Dist LAD lesion, 25 %stenosed.  Ost RCA to Prox RCA lesion, 25 %stenosed.  The left ventricular ejection fraction is 55-65% by visual estimate.  The left ventricular systolic function is normal.  LV end diastolic pressure is normal. - Normal LV function without significant wall motion abnormalities. - Mild CAD with 40% smooth ostial narrowing of the DX1 and 25% mid LAD stenosis; normal ramus and left circumflex vessel; mild 25% proximal RCA narrrowing with widely patent RCA stent beyond the acute margin and total mid occlusion of a small PDA vessel with antegrade and retrograde collateralization. - RECOMMENDATION: Medical therapy.  The patient will be given clearance for his cervical neck surgery. He will need to hold ASA/Brilinta for at least 5-7 days prior to neurosurgery.   Echo 09/18/15:  - Left ventricle: The cavity size was normal. There was severe focal basal and moderate concentric hypertrophy of the let ventricle. Systolic function was vigorous. The estimated ejection fraction was in the range of 65% to 70%. Wall motion was normal; there were no regional  wall motion abnormalities. Doppler parameters are consistent with abnormal left ventricular relaxation (grade 1 diastolic dysfunction). Doppler parameters are consistent with elevated ventricular end-diastolic filling pressure. - Aortic valve: There was no regurgitation. - Aortic root: The aortic root was normal in size. - Mitral valve: There was no regurgitation. - Left atrium: The atrium was normal in size. - Right ventricle: Systolic function was normal. - Right atrium: The atrium was normal in size. - Tricuspid valve: There was trivial regurgitation. - Pulmonic valve: There was no regurgitation. - Pulmonary arteries: Systolic pressure was within the normal range. - Inferior vena cava: The vessel was normal in size. - Pericardium, extracardiac: There was no pericardial effusion.  If labs acceptable DOS, I anticipate pt can proceed as scheduled.   Rica Mast, FNP-BC [facility]esthesiology Phone: [phone] 04/30/2017 9:58 AM

## 2017-05-01 ENCOUNTER — Observation Stay (HOSPITAL_COMMUNITY)
Admission: RE | Admit: 2017-05-01 | Discharge: 2017-05-02 | Disposition: A | Discharge status: Home / Self Care | Payer: BLUE CROSS/BLUE SHIELD | Source: Ambulatory Visit | Attending: Neurosurgery | Admitting: Neurosurgery

## 2017-05-01 ENCOUNTER — Encounter (HOSPITAL_COMMUNITY): Payer: Self-pay | Admitting: *Deleted

## 2017-05-01 ENCOUNTER — Ambulatory Visit (HOSPITAL_COMMUNITY): Payer: BLUE CROSS/BLUE SHIELD | Admitting: Certified Registered Nurse Anesthetist

## 2017-05-01 ENCOUNTER — Encounter (HOSPITAL_COMMUNITY)
Admission: RE | Disposition: A | Discharge status: Home / Self Care | Payer: Self-pay | Source: Ambulatory Visit | Attending: Neurosurgery

## 2017-05-01 ENCOUNTER — Ambulatory Visit (HOSPITAL_COMMUNITY): Payer: BLUE CROSS/BLUE SHIELD

## 2017-05-01 ENCOUNTER — Ambulatory Visit (HOSPITAL_COMMUNITY): Payer: BLUE CROSS/BLUE SHIELD | Admitting: Emergency Medicine

## 2017-05-01 DIAGNOSIS — I252 Old myocardial infarction: Secondary | ICD-10-CM | POA: Diagnosis not present

## 2017-05-01 DIAGNOSIS — M4722 Other spondylosis with radiculopathy, cervical region: Secondary | ICD-10-CM | POA: Diagnosis not present

## 2017-05-01 DIAGNOSIS — F329 Major depressive disorder, single episode, unspecified: Secondary | ICD-10-CM | POA: Insufficient documentation

## 2017-05-01 DIAGNOSIS — K219 Gastro-esophageal reflux disease without esophagitis: Secondary | ICD-10-CM | POA: Insufficient documentation

## 2017-05-01 DIAGNOSIS — I10 Essential (primary) hypertension: Secondary | ICD-10-CM | POA: Diagnosis not present

## 2017-05-01 DIAGNOSIS — Z419 Encounter for procedure for purposes other than remedying health state, unspecified: Secondary | ICD-10-CM

## 2017-05-01 DIAGNOSIS — I2111 ST elevation (STEMI) myocardial infarction involving right coronary artery: Secondary | ICD-10-CM | POA: Diagnosis not present

## 2017-05-01 DIAGNOSIS — I2510 Atherosclerotic heart disease of native coronary artery without angina pectoris: Secondary | ICD-10-CM | POA: Diagnosis not present

## 2017-05-01 DIAGNOSIS — E782 Mixed hyperlipidemia: Secondary | ICD-10-CM | POA: Insufficient documentation

## 2017-05-01 DIAGNOSIS — M50121 Cervical disc disorder at C4-C5 level with radiculopathy: Secondary | ICD-10-CM | POA: Diagnosis not present

## 2017-05-01 DIAGNOSIS — M50122 Cervical disc disorder at C5-C6 level with radiculopathy: Secondary | ICD-10-CM | POA: Diagnosis not present

## 2017-05-01 DIAGNOSIS — Z79899 Other long term (current) drug therapy: Secondary | ICD-10-CM | POA: Insufficient documentation

## 2017-05-01 DIAGNOSIS — Z7982 Long term (current) use of aspirin: Secondary | ICD-10-CM | POA: Insufficient documentation

## 2017-05-01 DIAGNOSIS — G4730 Sleep apnea, unspecified: Secondary | ICD-10-CM | POA: Insufficient documentation

## 2017-05-01 DIAGNOSIS — M5020 Other cervical disc displacement, unspecified cervical region: Secondary | ICD-10-CM | POA: Diagnosis present

## 2017-05-01 DIAGNOSIS — Z880 Allergy status to penicillin: Secondary | ICD-10-CM | POA: Insufficient documentation

## 2017-05-01 DIAGNOSIS — M4322 Fusion of spine, cervical region: Secondary | ICD-10-CM | POA: Diagnosis not present

## 2017-05-01 DIAGNOSIS — E785 Hyperlipidemia, unspecified: Secondary | ICD-10-CM | POA: Diagnosis not present

## 2017-05-01 HISTORY — PX: ANTERIOR CERVICAL DECOMP/DISCECTOMY FUSION: SHX1161

## 2017-05-01 LAB — BASIC METABOLIC PANEL
ANION GAP: 8 (ref 5–15)
BUN: 11 mg/dL (ref 6–20)
CHLORIDE: 109 mmol/L (ref 101–111)
CO2: 21 mmol/L — AB (ref 22–32)
Calcium: 8.8 mg/dL — ABNORMAL LOW (ref 8.9–10.3)
Creatinine, Ser: 0.75 mg/dL (ref 0.61–1.24)
GFR calc non Af Amer: >60 mL/min (ref >60)
GLUCOSE: 116 mg/dL — AB (ref 65–99)
POTASSIUM: 4.3 mmol/L (ref 3.5–5.1)
Sodium: 138 mmol/L (ref 135–145)

## 2017-05-01 SURGERY — ANTERIOR CERVICAL DECOMPRESSION/DISCECTOMY FUSION 2 LEVELS
Anesthesia: General

## 2017-05-01 MED ORDER — PHENOL 1.4 % MT LIQD: 1.0000 | OROMUCOSAL | Status: DC | PRN

## 2017-05-01 MED ORDER — MEPERIDINE HCL 25 MG/ML IJ SOLN: 6.2500 mg | INTRAMUSCULAR | Status: DC | PRN

## 2017-05-01 MED ORDER — PROPOFOL 10 MG/ML IV BOLUS
INTRAVENOUS | Status: DC | PRN
  Administered 2017-05-01: 160 mg via INTRAVENOUS

## 2017-05-01 MED ORDER — MIDAZOLAM HCL 2 MG/2ML IJ SOLN
INTRAMUSCULAR | Status: AC
  Filled 2017-05-01: qty 2

## 2017-05-01 MED ORDER — HYDROCODONE-ACETAMINOPHEN 5-325 MG PO TABS
ORAL_TABLET | ORAL | Status: AC
  Filled 2017-05-01: qty 2

## 2017-05-01 MED ORDER — SODIUM CHLORIDE 0.9 % IR SOLN
Status: DC | PRN
  Administered 2017-05-01: 10:00:00

## 2017-05-01 MED ORDER — 0.9 % SODIUM CHLORIDE (POUR BTL) OPTIME
TOPICAL | Status: DC | PRN
  Administered 2017-05-01: 1000 mL

## 2017-05-01 MED ORDER — THROMBIN 20000 UNITS EX SOLR
CUTANEOUS | Status: AC
  Filled 2017-05-01: qty 20000

## 2017-05-01 MED ORDER — PROPOFOL 10 MG/ML IV BOLUS
INTRAVENOUS | Status: AC
  Filled 2017-05-01: qty 40

## 2017-05-01 MED ORDER — KETOROLAC TROMETHAMINE 30 MG/ML IJ SOLN
INTRAMUSCULAR | Status: AC
  Filled 2017-05-01: qty 1

## 2017-05-01 MED ORDER — HYDROXYZINE HCL 25 MG PO TABS: 50.0000 mg | ORAL_TABLET | ORAL | Status: DC | PRN

## 2017-05-01 MED ORDER — FENTANYL CITRATE (PF) 250 MCG/5ML IJ SOLN
INTRAMUSCULAR | Status: AC
  Filled 2017-05-01: qty 5

## 2017-05-01 MED ORDER — METOCLOPRAMIDE HCL 5 MG/ML IJ SOLN: 10.0000 mg | Freq: Once | INTRAMUSCULAR | Status: DC | PRN

## 2017-05-01 MED ORDER — ESCITALOPRAM OXALATE 10 MG PO TABS
10.0000 mg | ORAL_TABLET | Freq: Every day | ORAL | Status: DC
  Filled 2017-05-01: qty 1

## 2017-05-01 MED ORDER — ADULT MULTIVITAMIN W/MINERALS CH: 1.0000 | ORAL_TABLET | Freq: Every day | ORAL | Status: DC

## 2017-05-01 MED ORDER — ALUM & MAG HYDROXIDE-SIMETH 200-200-20 MG/5ML PO SUSP: 30.0000 mL | Freq: Four times a day (QID) | ORAL | Status: DC | PRN

## 2017-05-01 MED ORDER — AMLODIPINE BESYLATE 5 MG PO TABS
5.0000 mg | ORAL_TABLET | Freq: Every day | ORAL | Status: DC
  Filled 2017-05-01 (×2): qty 1

## 2017-05-01 MED ORDER — ONDANSETRON HCL 4 MG/2ML IJ SOLN: 4.0000 mg | Freq: Four times a day (QID) | INTRAMUSCULAR | Status: DC | PRN

## 2017-05-01 MED ORDER — IRBESARTAN 300 MG PO TABS
300.0000 mg | ORAL_TABLET | Freq: Every day | ORAL | Status: DC
  Administered 2017-05-01: 300 mg via ORAL
  Filled 2017-05-01: qty 1

## 2017-05-01 MED ORDER — BUPIVACAINE HCL (PF) 0.5 % IJ SOLN
INTRAMUSCULAR | Status: AC
  Filled 2017-05-01: qty 30

## 2017-05-01 MED ORDER — THROMBIN 5000 UNITS EX SOLR
CUTANEOUS | Status: DC | PRN
  Administered 2017-05-01: 10:00:00 via TOPICAL

## 2017-05-01 MED ORDER — KETOROLAC TROMETHAMINE 30 MG/ML IJ SOLN
30.0000 mg | Freq: Four times a day (QID) | INTRAMUSCULAR | Status: DC
  Administered 2017-05-01 – 2017-05-02 (×3): 30 mg via INTRAVENOUS
  Filled 2017-05-01 (×3): qty 1

## 2017-05-01 MED ORDER — OMEGA-3-ACID ETHYL ESTERS 1 G PO CAPS
2.0000 g | ORAL_CAPSULE | Freq: Two times a day (BID) | ORAL | Status: DC
  Administered 2017-05-01: 2 g via ORAL
  Filled 2017-05-01 (×2): qty 2

## 2017-05-01 MED ORDER — CHLORHEXIDINE GLUCONATE CLOTH 2 % EX PADS: 6.0000 | MEDICATED_PAD | Freq: Once | CUTANEOUS | Status: DC

## 2017-05-01 MED ORDER — FLEET ENEMA 7-19 GM/118ML RE ENEM: 1.0000 | ENEMA | Freq: Once | RECTAL | Status: DC | PRN

## 2017-05-01 MED ORDER — MAGNESIUM HYDROXIDE 400 MG/5ML PO SUSP: 30.0000 mL | Freq: Every day | ORAL | Status: DC | PRN

## 2017-05-01 MED ORDER — SODIUM CHLORIDE 0.9% FLUSH
3.0000 mL | Freq: Two times a day (BID) | INTRAVENOUS | Status: DC
  Administered 2017-05-01 (×2): 3 mL via INTRAVENOUS

## 2017-05-01 MED ORDER — LIDOCAINE-EPINEPHRINE 1 %-1:100000 IJ SOLN
INTRAMUSCULAR | Status: AC
  Filled 2017-05-01: qty 1

## 2017-05-01 MED ORDER — KCL IN DEXTROSE-NACL 20-5-0.45 MEQ/L-%-% IV SOLN: INTRAVENOUS | Status: DC

## 2017-05-01 MED ORDER — ONDANSETRON HCL 4 MG/2ML IJ SOLN
INTRAMUSCULAR | Status: DC | PRN
  Administered 2017-05-01: 4 mg via INTRAVENOUS

## 2017-05-01 MED ORDER — FENTANYL CITRATE (PF) 100 MCG/2ML IJ SOLN
INTRAMUSCULAR | Status: AC
  Filled 2017-05-01: qty 2

## 2017-05-01 MED ORDER — PHENYLEPHRINE 40 MCG/ML (10ML) SYRINGE FOR IV PUSH (FOR BLOOD PRESSURE SUPPORT)
PREFILLED_SYRINGE | INTRAVENOUS | Status: DC | PRN
  Administered 2017-05-01: 80 ug via INTRAVENOUS

## 2017-05-01 MED ORDER — SUGAMMADEX SODIUM 500 MG/5ML IV SOLN
INTRAVENOUS | Status: AC
  Filled 2017-05-01: qty 5

## 2017-05-01 MED ORDER — SODIUM CHLORIDE 0.9% FLUSH: 3.0000 mL | INTRAVENOUS | Status: DC | PRN

## 2017-05-01 MED ORDER — ONDANSETRON HCL 4 MG/2ML IJ SOLN
INTRAMUSCULAR | Status: AC
  Filled 2017-05-01: qty 2

## 2017-05-01 MED ORDER — CYCLOBENZAPRINE HCL 5 MG PO TABS
5.0000 mg | ORAL_TABLET | Freq: Three times a day (TID) | ORAL | Status: DC | PRN
  Administered 2017-05-01: 10 mg via ORAL

## 2017-05-01 MED ORDER — THROMBIN 5000 UNITS EX SOLR
CUTANEOUS | Status: AC
  Filled 2017-05-01: qty 5000

## 2017-05-01 MED ORDER — MUPIROCIN 2 % EX OINT
1.0000 "application " | TOPICAL_OINTMENT | Freq: Two times a day (BID) | CUTANEOUS | Status: DC
  Administered 2017-05-01: 1 via NASAL
  Filled 2017-05-01: qty 22

## 2017-05-01 MED ORDER — ROSUVASTATIN CALCIUM 40 MG PO TABS
40.0000 mg | ORAL_TABLET | Freq: Every day | ORAL | Status: DC
  Administered 2017-05-01: 40 mg via ORAL
  Filled 2017-05-01: qty 2
  Filled 2017-05-01: qty 1

## 2017-05-01 MED ORDER — MIDAZOLAM HCL 5 MG/5ML IJ SOLN
INTRAMUSCULAR | Status: DC | PRN
  Administered 2017-05-01: 2 mg via INTRAVENOUS

## 2017-05-01 MED ORDER — METOPROLOL TARTRATE 50 MG PO TABS
50.0000 mg | ORAL_TABLET | Freq: Two times a day (BID) | ORAL | Status: DC
  Filled 2017-05-01: qty 1

## 2017-05-01 MED ORDER — DEXAMETHASONE SODIUM PHOSPHATE 10 MG/ML IJ SOLN
INTRAMUSCULAR | Status: DC | PRN
Start: 2017-05-01 — End: 2017-05-01
  Administered 2017-05-01: 10 mg via INTRAVENOUS

## 2017-05-01 MED ORDER — LIDOCAINE 2% (20 MG/ML) 5 ML SYRINGE
INTRAMUSCULAR | Status: DC | PRN
  Administered 2017-05-01: 60 mg via INTRAVENOUS

## 2017-05-01 MED ORDER — ACETAMINOPHEN 10 MG/ML IV SOLN
INTRAVENOUS | Status: DC | PRN
  Administered 2017-05-01: 1000 mg via INTRAVENOUS

## 2017-05-01 MED ORDER — MORPHINE SULFATE (PF) 4 MG/ML IV SOLN: 4.0000 mg | INTRAVENOUS | Status: DC | PRN

## 2017-05-01 MED ORDER — ARTIFICIAL TEARS OPHTHALMIC OINT
TOPICAL_OINTMENT | OPHTHALMIC | Status: AC
  Filled 2017-05-01: qty 14

## 2017-05-01 MED ORDER — ACETAMINOPHEN 650 MG RE SUPP: 650.0000 mg | RECTAL | Status: DC | PRN

## 2017-05-01 MED ORDER — EPHEDRINE 5 MG/ML INJ
INTRAVENOUS | Status: AC
  Filled 2017-05-01: qty 10

## 2017-05-01 MED ORDER — PHENYLEPHRINE 40 MCG/ML (10ML) SYRINGE FOR IV PUSH (FOR BLOOD PRESSURE SUPPORT)
PREFILLED_SYRINGE | INTRAVENOUS | Status: AC
  Filled 2017-05-01: qty 20

## 2017-05-01 MED ORDER — CYCLOBENZAPRINE HCL 10 MG PO TABS
ORAL_TABLET | ORAL | Status: AC
  Filled 2017-05-01: qty 1

## 2017-05-01 MED ORDER — NITROGLYCERIN 0.4 MG SL SUBL: 0.4000 mg | SUBLINGUAL_TABLET | SUBLINGUAL | Status: DC | PRN

## 2017-05-01 MED ORDER — HYDROCODONE-ACETAMINOPHEN 5-325 MG PO TABS: 1.0000 | ORAL_TABLET | ORAL | 0 refills | Status: DC | PRN

## 2017-05-01 MED ORDER — ACETAMINOPHEN 10 MG/ML IV SOLN
INTRAVENOUS | Status: AC
  Filled 2017-05-01: qty 100

## 2017-05-01 MED ORDER — PHENYLEPHRINE 40 MCG/ML (10ML) SYRINGE FOR IV PUSH (FOR BLOOD PRESSURE SUPPORT)
PREFILLED_SYRINGE | INTRAVENOUS | Status: AC
  Filled 2017-05-01: qty 10

## 2017-05-01 MED ORDER — DEXTROSE 5 % IV SOLN
INTRAVENOUS | Status: DC | PRN
  Administered 2017-05-01: 25 ug/min via INTRAVENOUS

## 2017-05-01 MED ORDER — HYDROCODONE-ACETAMINOPHEN 5-325 MG PO TABS
1.0000 | ORAL_TABLET | ORAL | Status: DC | PRN
  Administered 2017-05-01: 2 via ORAL

## 2017-05-01 MED ORDER — DEXAMETHASONE SODIUM PHOSPHATE 10 MG/ML IJ SOLN
INTRAMUSCULAR | Status: AC
  Filled 2017-05-01: qty 1

## 2017-05-01 MED ORDER — HYDROXYZINE HCL 50 MG/ML IM SOLN: 50.0000 mg | INTRAMUSCULAR | Status: DC | PRN

## 2017-05-01 MED ORDER — LACTATED RINGERS IV SOLN: INTRAVENOUS | Status: DC

## 2017-05-01 MED ORDER — FENTANYL CITRATE (PF) 100 MCG/2ML IJ SOLN
INTRAMUSCULAR | Status: DC | PRN
  Administered 2017-05-01 (×2): 50 ug via INTRAVENOUS
  Administered 2017-05-01: 100 ug via INTRAVENOUS
  Administered 2017-05-01: 50 ug via INTRAVENOUS
  Administered 2017-05-01: 100 ug via INTRAVENOUS

## 2017-05-01 MED ORDER — LACTATED RINGERS IV SOLN
INTRAVENOUS | Status: DC | PRN
  Administered 2017-05-01 (×2): via INTRAVENOUS

## 2017-05-01 MED ORDER — THROMBIN 20000 UNITS EX SOLR
CUTANEOUS | Status: DC | PRN
  Administered 2017-05-01: 10:00:00 via TOPICAL

## 2017-05-01 MED ORDER — LIDOCAINE 2% (20 MG/ML) 5 ML SYRINGE
INTRAMUSCULAR | Status: AC
  Filled 2017-05-01: qty 5

## 2017-05-01 MED ORDER — BISACODYL 10 MG RE SUPP: 10.0000 mg | Freq: Every day | RECTAL | Status: DC | PRN

## 2017-05-01 MED ORDER — ROCURONIUM BROMIDE 10 MG/ML (PF) SYRINGE
PREFILLED_SYRINGE | INTRAVENOUS | Status: AC
  Filled 2017-05-01: qty 10

## 2017-05-01 MED ORDER — LIDOCAINE-EPINEPHRINE 1 %-1:100000 IJ SOLN
INTRAMUSCULAR | Status: DC | PRN
  Administered 2017-05-01: 13 mL via INTRADERMAL

## 2017-05-01 MED ORDER — FENTANYL CITRATE (PF) 100 MCG/2ML IJ SOLN
25.0000 ug | INTRAMUSCULAR | Status: DC | PRN
  Administered 2017-05-01 (×2): 50 ug via INTRAVENOUS

## 2017-05-01 MED ORDER — KETOROLAC TROMETHAMINE 30 MG/ML IJ SOLN
30.0000 mg | Freq: Once | INTRAMUSCULAR | Status: AC
  Administered 2017-05-01: 30 mg via INTRAVENOUS

## 2017-05-01 MED ORDER — GENTAMICIN IN SALINE 1.6-0.9 MG/ML-% IV SOLN
80.0000 mg | Freq: Once | INTRAVENOUS | Status: AC
  Administered 2017-05-01: 80 mg via INTRAVENOUS
  Filled 2017-05-01: qty 50

## 2017-05-01 MED ORDER — SUGAMMADEX SODIUM 200 MG/2ML IV SOLN
INTRAVENOUS | Status: DC | PRN
Start: 2017-05-01 — End: 2017-05-01
  Administered 2017-05-01: 220.4 mg via INTRAVENOUS

## 2017-05-01 MED ORDER — MENTHOL 3 MG MT LOZG: 1.0000 | LOZENGE | OROMUCOSAL | Status: DC | PRN

## 2017-05-01 MED ORDER — ACETAMINOPHEN 325 MG PO TABS: 650.0000 mg | ORAL_TABLET | ORAL | Status: DC | PRN

## 2017-05-01 MED ORDER — ROCURONIUM BROMIDE 10 MG/ML (PF) SYRINGE
PREFILLED_SYRINGE | INTRAVENOUS | Status: DC | PRN
  Administered 2017-05-01: 20 mg via INTRAVENOUS
  Administered 2017-05-01: 60 mg via INTRAVENOUS
  Administered 2017-05-01: 40 mg via INTRAVENOUS
  Administered 2017-05-01: 30 mg via INTRAVENOUS

## 2017-05-01 MED ORDER — ONDANSETRON HCL 4 MG PO TABS: 4.0000 mg | ORAL_TABLET | Freq: Four times a day (QID) | ORAL | Status: DC | PRN

## 2017-05-01 MED ORDER — EPHEDRINE SULFATE-NACL 50-0.9 MG/10ML-% IV SOSY
PREFILLED_SYRINGE | INTRAVENOUS | Status: DC | PRN
  Administered 2017-05-01: 10 mg via INTRAVENOUS

## 2017-05-01 MED ORDER — IRBESARTAN 300 MG PO TABS: 300.0000 mg | ORAL_TABLET | Freq: Every day | ORAL | Status: DC

## 2017-05-01 SURGICAL SUPPLY — 59 items
ALLOGRAFT 7X14X11 (Bone Implant) ×2 IMPLANT
BAG DECANTER FOR FLEXI CONT (MISCELLANEOUS) ×2 IMPLANT
BIT DRILL 14X2.5XNS TI ANT (BIT) ×1 IMPLANT
BIT DRILL AVIATOR 14 (BIT) ×1
BIT DRILL NEURO 2X3.1 SFT TUCH (MISCELLANEOUS) ×1 IMPLANT
BIT DRL 14X2.5XNS TI ANT (BIT) ×1
BLADE ULTRA TIP 2M (BLADE) ×2 IMPLANT
CANISTER SUCT 3000ML PPV (MISCELLANEOUS) ×2 IMPLANT
CARTRIDGE OIL MAESTRO DRILL (MISCELLANEOUS) ×1 IMPLANT
COVER MAYO STAND STRL (DRAPES) ×2 IMPLANT
DECANTER SPIKE VIAL GLASS SM (MISCELLANEOUS) ×2 IMPLANT
DERMABOND ADVANCED (GAUZE/BANDAGES/DRESSINGS) ×1
DERMABOND ADVANCED .7 DNX12 (GAUZE/BANDAGES/DRESSINGS) ×1 IMPLANT
DIFFUSER DRILL AIR PNEUMATIC (MISCELLANEOUS) ×2 IMPLANT
DRAPE HALF SHEET 40X57 (DRAPES) ×2 IMPLANT
DRAPE LAPAROTOMY 100X72 PEDS (DRAPES) ×2 IMPLANT
DRAPE MICROSCOPE LEICA (MISCELLANEOUS) ×2 IMPLANT
DRAPE POUCH INSTRU U-SHP 10X18 (DRAPES) ×2 IMPLANT
DRILL NEURO 2X3.1 SOFT TOUCH (MISCELLANEOUS) ×2
ELECT COATED BLADE 2.86 ST (ELECTRODE) ×2 IMPLANT
ELECT REM PT RETURN 9FT ADLT (ELECTROSURGICAL) ×2
ELECTRODE REM PT RTRN 9FT ADLT (ELECTROSURGICAL) ×1 IMPLANT
GLOVE BIOGEL PI IND STRL 8 (GLOVE) ×1 IMPLANT
GLOVE BIOGEL PI IND STRL 8.5 (GLOVE) ×1 IMPLANT
GLOVE BIOGEL PI INDICATOR 8 (GLOVE) ×1
GLOVE BIOGEL PI INDICATOR 8.5 (GLOVE) ×1
GLOVE ECLIPSE 7.5 STRL STRAW (GLOVE) ×2 IMPLANT
GLOVE EXAM NITRILE LRG STRL (GLOVE) IMPLANT
GLOVE EXAM NITRILE XL STR (GLOVE) IMPLANT
GLOVE EXAM NITRILE XS STR PU (GLOVE) IMPLANT
GLOVE SS BIOGEL STRL SZ 8 (GLOVE) ×1 IMPLANT
GLOVE SUPERSENSE BIOGEL SZ 8 (GLOVE) ×1
GOWN STRL REUS W/ TWL LRG LVL3 (GOWN DISPOSABLE) IMPLANT
GOWN STRL REUS W/ TWL XL LVL3 (GOWN DISPOSABLE) ×2 IMPLANT
GOWN STRL REUS W/TWL 2XL LVL3 (GOWN DISPOSABLE) IMPLANT
GOWN STRL REUS W/TWL LRG LVL3 (GOWN DISPOSABLE)
GOWN STRL REUS W/TWL XL LVL3 (GOWN DISPOSABLE) ×2
GRAFT CORT CANC 14X8.25X11 5D (Bone Implant) ×2 IMPLANT
HALTER HD/CHIN CERV TRACTION D (MISCELLANEOUS) ×2 IMPLANT
HEMOSTAT POWDER KIT SURGIFOAM (HEMOSTASIS) ×2 IMPLANT
KIT BASIN OR (CUSTOM PROCEDURE TRAY) ×2 IMPLANT
KIT ROOM TURNOVER OR (KITS) ×2 IMPLANT
NEEDLE HYPO 25X1 1.5 SAFETY (NEEDLE) ×2 IMPLANT
NEEDLE SPNL 22GX3.5 QUINCKE BK (NEEDLE) ×4 IMPLANT
NS IRRIG 1000ML POUR BTL (IV SOLUTION) ×2 IMPLANT
OIL CARTRIDGE MAESTRO DRILL (MISCELLANEOUS) ×2
PACK LAMINECTOMY NEURO (CUSTOM PROCEDURE TRAY) ×2 IMPLANT
PAD ARMBOARD 7.5X6 YLW CONV (MISCELLANEOUS) ×6 IMPLANT
PLATE AVIATOR ASSY 2LVL SZ 34 (Plate) ×2 IMPLANT
RUBBERBAND STERILE (MISCELLANEOUS) ×4 IMPLANT
SCREW AVIATOR VAR SELFTAP 4X14 (Screw) ×12 IMPLANT
SPONGE INTESTINAL PEANUT (DISPOSABLE) ×2 IMPLANT
SPONGE SURGIFOAM ABS GEL 100 (HEMOSTASIS) ×2 IMPLANT
STAPLER SKIN PROX WIDE 3.9 (STAPLE) ×2 IMPLANT
SUT VIC AB 2-0 CP2 18 (SUTURE) ×2 IMPLANT
SUT VIC AB 3-0 SH 8-18 (SUTURE) ×4 IMPLANT
TOWEL GREEN STERILE (TOWEL DISPOSABLE) ×2 IMPLANT
TOWEL GREEN STERILE FF (TOWEL DISPOSABLE) ×2 IMPLANT
WATER STERILE IRR 1000ML POUR (IV SOLUTION) ×2 IMPLANT

## 2017-05-01 NOTE — Op Note (Signed)
05/01/2017  11:49 AM  PATIENT:  Richard Hull  41 y.o. male  PRE-OPERATIVE DIAGNOSIS:  C4-5 and C5-6 cervical disc herniation; cervical spondylosis with radiculopathy; cervical degenerative disc disease; cervical radiculopathy; bilateral upper extremity weakness  POST-OPERATIVE DIAGNOSIS:  C4-5 and C5-6 cervical disc herniation; cervical spondylosis with radiculopathy; cervical degenerative disc disease; cervical radiculopathy; bilateral upper extremity weakness  PROCEDURE:  Procedure(s):  C4-5 and C5-6 anterior cervical decompression and arthrodesis with structural allograft and a V or cervical plating  SURGEON:  Shirlean Kelly, M.D.  ASSISTANTS: Tressie Stalker, M.D.  ANESTHESIA:   general  EBL:  Total I/O In: 1000 [I.V.:1000] Out: 50 [Blood:50]  BLOOD ADMINISTERED:none  COUNT: Correct per nursing staff  DICTATION: Patient was brought to the operating room placed under general endotracheal anesthesia. Patient was placed in 10 pounds of halter traction. The neck was prepped with Betadine soap and solution and draped in a sterile fashion. A horizontal incision was made on the left side of the neck. The line of the incision was infiltrated with local anesthetic with epinephrine. Dissection was carried down thru the subcutaneous tissue and platysma, bipolar cautery was used to maintain hemostasis. Dissection was then carried out thru an avascular plane leaving the sternocleidomastoid carotid artery and jugular vein laterally and the trachea and esophagus medially. The ventral aspect of the vertebral column was identified and a localizing x-ray was taken. The C4-5 and C5-6 levels were identified. The annulus at each level was incised and the disc space entered. Discectomy was performed with micro-curettes and pituitary rongeurs. The operating microscope was draped and brought into the field provided additional magnification illumination and visualization. Discectomy was continued posteriorly  thru the disc space and then the cartilaginous endplate was removed using micro-curettes along with the high-speed drill. Posterior osteophytic overgrowth was removed each level using the high-speed drill along with a 2 mm thin footplated Kerrison punch. Posterior longitudinal ligament along with disc herniation was carefully removed, decompressing the spinal canal and thecal sac. We then continued to remove osteophytic overgrowth and disc material decompressing the neural foramina and exiting nerve roots bilaterally. Once the decompression was completed hemostasis was established at each level with the use of Gelfoam with thrombin and bipolar cautery. The Gelfoam was removed, the wound irrigated, a thin layer of Surgifoam was applied, and hemostasis confirmed. We then measured the height of the intravertebral disc space level and selected a 7 millimeter in height structural allograft for the C4-5 level and a 8 millimeter in height structural allograft for the C5-6 level . Each was hydrated and saline solution and then gently positioned in the intravertebral disc space and countersunk. We then selected a 34 millimeter in height Aviator cervical plate. It was positioned over the fusion construct and secured to the vertebra with a pair of 4 x 14 mm self-tapping variable screws at the C4 level, a pair of 4 x 14 mm self-tapping variable screws at the C5 level, and a pair of 4 x 14 mm self-tapping variable screws at the C6 level. Each screw hole was started with the high-speed drill and then the screws placed, once all the screws were placed, the locking system was secured. The wound was irrigated with bacitracin solution checked for hemostasis which was established and confirmed. An x-ray was taken which showed only the upper portion of the fusion construct. The graft at C4-5 was in good position, and the plate and screws were in good position at the C4-5 level, however we could not visualize the  C5-6 disc level nor the  C6 vertebra due to the patient's large shoulders. However under direct visualization, the grafts were in good position, the plate and screws were good position, and the overall construct looked good. We again confirmed hemostasis. We then proceeded with closure. The platysma was closed with interrupted inverted 2-0 undyed Vicryl suture, the subcutaneous and subcuticular closed with interrupted inverted 3-0 undyed Vicryl suture. The skin edges were approximated with Dermabond. Following surgery the patient was taken out of cervical traction. To be reversed and the anesthetic and taken to the recovery room for further care.   PLAN OF CARE: Admit for overnight observation  PATIENT DISPOSITION:  PACU - hemodynamically stable.   Delay start of Pharmacological VTE agent (>24hrs) due to surgical blood loss or risk of bleeding:  yes

## 2017-05-01 NOTE — H&P (Signed)
Subjective: Patient is a 41 y.o.right-handed white male who is admitted for treatment of multilevel cervical disc herniations with underlying cervical spondylosis and degenerative disc disease, with resulting stenosis and thecal sac, spinal cord and nerve root compression, with resulting bilateral upper extremity weakness.  MRI scan shows broad-based bilobed discolorations both C4-5 and C5-6, worse at the C5-6 level. At C4-5 it is worse the right, and at C4-5-6 is worse the left. Patient is admitted now for 2 level C4-5 and C5-6 anterior cervical decompression and arthrodesis with structural allograft and cervical plating.   Patient Active Problem List   Diagnosis Date Noted  . Abnormal nuclear stress test   . Cervical radiculopathy at C5 01/27/2016  . Mixed hyperlipidemia 01/09/2016  . Evaluate for OSA (obstructive sleep apnea) 01/09/2016  . Elevated TSH 09/18/2015  . Stented coronary artery   . Hyperglycemia 09/16/2015  . Hyperlipidemia LDL goal <70 09/16/2015  . Hypertension, malignant 09/15/2015  . ST elevation (STEMI) myocardial infarction involving right coronary artery (HCC) 09/15/2015   Past Medical History:  Diagnosis Date  . Cervical radiculopathy at C5 01/27/2016   left  . Coronary artery disease 08/2015  . Depression   . GERD (gastroesophageal reflux disease)   . History of kidney stones   . Hyperlipidemia LDL goal <70   . Hypertension   . Hypertriglyceridemia   . Left arm weakness   . Myocardial infarction (HCC) 08/2015  . OCD (obsessive compulsive disorder)   . Sleep apnea     Past Surgical History:  Procedure Laterality Date  . CARDIAC CATHETERIZATION N/A 09/15/2015   Procedure: Left Heart Cath and Coronary Angiography;  Surgeon: Lennette Bihari, MD; LAD 30%, pRCA 30%, dRCA 30%, RPDA 99%, nl LV function   . CARDIAC CATHETERIZATION N/A 09/15/2015   Procedure: Coronary Stent Intervention;  Surgeon: Lennette Bihari, MD; 3.515 mm Xience Alpine DES to dRCA/RPDA>> 5%  residual     . LEFT HEART CATH AND CORONARY ANGIOGRAPHY N/A 11/28/2016   Procedure: Left Heart Cath and Coronary Angiography;  Surgeon: Lennette Bihari, MD;  Location: [facility] INVASIVE CV LAB;  Service: Cardiovascular;  Laterality: N/A;  . TONSILLECTOMY      Prescriptions Prior to Admission  Medication Sig Dispense Refill Last Dose  . amLODipine (NORVASC) 5 MG tablet TAKE 1 TABLET BY MOUTH  DAILY (Patient taking differently: TAKE 5 MG BY MOUTH AT BEDTIME) 90 tablet 3 04/30/2017 at Unknown time  . aspirin EC 81 MG EC tablet Take 1 tablet (81 mg total) by mouth daily. (Patient taking differently: Take 81 mg by mouth every evening. )   Past Month at Unknown time  . Chlorpheniramine Maleate (ALLERGY PO) Take 1 tablet by mouth daily as needed (for allergies).    PRN  . escitalopram (LEXAPRO) 10 MG tablet Take 10 mg by mouth daily.   05/01/2017 at 0515  . irbesartan (AVAPRO) 300 MG tablet Take 300 mg by mouth at bedtime.   04/30/2017 at Unknown time  . metoprolol (LOPRESSOR) 50 MG tablet Take 1.5 tablets (75 mg total) by mouth 2 (two) times daily. (Patient taking differently: Take 50 mg by mouth 2 (two) times daily. ) 90 tablet 11 05/01/2017 at 0515  . Multiple Vitamin (MULTIVITAMIN WITH MINERALS) TABS tablet Take 1 tablet by mouth daily.   Past Week at Unknown time  . nitroGLYCERIN (NITROSTAT) 0.4 MG SL tablet Place 1 tablet (0.4 mg total) under the tongue every 5 (five) minutes as needed for chest pain (CP or SOB). 25 tablet  12 PRN  . rosuvastatin (CRESTOR) 40 MG tablet Take 1 tablet (40 mg total) by mouth daily. (Patient taking differently: Take 40 mg by mouth at bedtime. ) 90 tablet 3 04/30/2017 at Unknown time  . ticagrelor (BRILINTA) 90 MG TABS tablet Take 1 tablet (90 mg total) by mouth 2 (two) times daily. 60 tablet 11 3 weeks ago at Unknown time  . valsartan (DIOVAN) 320 MG tablet Take 1 tablet (320 mg total) by mouth daily. (Patient taking differently: Take 320 mg by mouth at bedtime. ) 90 tablet 3  04/30/2017 at Unknown time  . VASCEPA 1 g CAPS TAKE 2 CAPSULES BY MOUTH  TWO TIMES DAILY (Patient taking differently: TAKE 2 G BY MOUTH  TWO TIMES DAILY) 360 capsule 2 2 weeks ago at Unknown time  . vitamin C (ASCORBIC ACID) 500 MG tablet Take 500 mg by mouth 2 (two) times daily.   Past Month at Unknown time   Allergies  Allergen Reactions  . Amoxicillin Rash  . Penicillins Rash and Other (See Comments)    Has patient had a PCN reaction causing immediate rash, facial/tongue/throat swelling, SOB or lightheadedness with hypotension: Yes Has patient had a PCN reaction causing severe rash involving mucus membranes or skin necrosis: No Has patient had a PCN reaction that required hospitalization No Has patient had a PCN reaction occurring within the last 10 years: No If all of the above answers are "NO", then may proceed with Cephalosporin use.     Social History  Substance Use Topics  . Smoking status: Never Smoker  . Smokeless tobacco: Never Used  . Alcohol use 0.0 oz/week     Comment: Rarely    Family History  Problem Relation Age of Onset  . Heart attack Father 7       approx age of first stent  . Stroke Father   . Hypertension Father   . Skin cancer Mother   . Hyperlipidemia Sister   . Stroke Paternal Grandmother   . Hypertension Paternal Grandmother   . Hypertension Maternal Grandmother   . Hypertension Maternal Grandfather   . Diabetes Maternal Grandfather   . Hypertension Paternal Grandfather   . Diabetes Paternal Grandfather   . Heart disease Paternal Grandfather      Review of Systems Pertinent items noted in HPI and remainder of comprehensive ROS otherwise negative.  Objective: Vital signs in last 24 hours: Temp:  [98.4 F (36.9 C)] 98.4 F (36.9 C) (09/12 0645) Pulse Rate:  [81] 81 (09/12 0645) Resp:  [18] 18 (09/12 0645) BP: (149)/(94) 149/94 (09/12 0645) SpO2:  [98 %] 98 % (09/12 0645)  EXAM: Appropriate well-nourished white male in no acute distress.  Lungs are clear to auscultation , the patient has symmetrical respiratory excursion. Heart has a regular rate and rhythm normal S1 and S2 no murmur.   Abdomen is soft nontender nondistended bowel sounds are present. Extremity examination shows no clubbing cyanosis or edema. Neurologic examination shows the left deltoid is 4+, the right deltoid is 5. Left biceps is 4 minus, the right biceps is 4+. Left triceps is 5, the right triceps is 4+ intrinsics are 5 bilaterally. Left grip is 4, right grip is 5. Sensation is intact to pinprick. Reflexes are absent at the biceps and brachioradialis, minimal of the triceps, trace the quadriceps, and one at the gastrocnemius. They're symmetrical bilaterally. Toes are downgoing bilaterally. He has a normal gait and stance.  Data Review:CBC    Component Value Date/Time   WBC 6.3  04/30/2017 0842   RBC 5.87 (H) 04/30/2017 0842   HGB 16.2 04/30/2017 0842   HCT 48.8 04/30/2017 0842   PLT 231 04/30/2017 0842   MCV 83.1 04/30/2017 0842   MCH 27.6 04/30/2017 0842   MCHC 33.2 04/30/2017 0842   RDW 13.7 04/30/2017 0842   LYMPHSABS 1.6 03/08/2014 2124   MONOABS 0.5 03/08/2014 2124   EOSABS 0.1 03/08/2014 2124   BASOSABS 0.0 03/08/2014 2124                          BMET    Component Value Date/Time   NA 138 05/01/2017 0708   K 4.3 05/01/2017 0708   CL 109 05/01/2017 0708   CO2 21 (L) 05/01/2017 0708   GLUCOSE 116 (H) 05/01/2017 0708   BUN 11 05/01/2017 0708   CREATININE 0.75 05/01/2017 0708   CREATININE 0.77 11/26/2016 1654   CALCIUM 8.8 (L) 05/01/2017 0708   GFRNONAA >60 05/01/2017 0708   GFRAA >60 05/01/2017 0708     Assessment/Plan: Patient with multilevel cervical disc herniations, spondylosis, and degenerative disease, with bilateral cervical radiculopathy with bilateral upper question of a weakness who is admitted now for a 2 level C4-5 and C5-6 ACDF.  I've discussed with the patient the nature of his condition, the nature the surgical procedure,  the typical length of surgery, hospital stay, and overall recuperation. We discussed limitations postoperatively. I discussed risks of surgery including risks of infection, bleeding, possibly need for transfusion, the risk of nerve root dysfunction with pain, weakness, numbness, or paresthesias, the risk of spinal cord dysfunction with paralysis of all 4 limbs and quadriplegia, and the risk of dural tear and CSF leakage and possible need for further surgery, the risk of esophageal dysfunction causing dysphagia and the risk of laryngeal dysfunction causing hoarseness of the voice, the risk of failure of the arthrodesis and the possible need for further surgery, and the risk of anesthetic complications including myocardial infarction, stroke, pneumonia, and death. We also discussed the need for postoperative immobilization in a cervical collar. Understanding all this the patient does wish to proceed with surgery and is admitted for such.    Hewitt Shorts, MD 05/01/2017 8:23 AM

## 2017-05-01 NOTE — Anesthesia Procedure Notes (Signed)
Procedure Name: Intubation Date/Time: 05/01/2017 8:44 AM Performed by: Annabelle Harman A Pre-anesthesia Checklist: Patient identified, Emergency Drugs available, Patient being monitored and Suction available Patient Re-evaluated:Patient Re-evaluated prior to induction Oxygen Delivery Method: Circle system utilized Preoxygenation: Pre-oxygenation with 100% oxygen Induction Type: IV induction Ventilation: Mask ventilation without difficulty and Oral airway inserted - appropriate to patient size Laryngoscope Size: Glidescope and 4 Grade View: Grade I Tube type: Oral Tube size: 8.0 mm Number of attempts: 2 Airway Equipment and Method: Rigid stylet,  Video-laryngoscopy and Oral airway Placement Confirmation: ETT inserted through vocal cords under direct vision,  positive ETCO2 and breath sounds checked- equal and bilateral Secured at: 24 cm Tube secured with: Tape Dental Injury: Teeth and Oropharynx as per pre-operative assessment  Difficulty Due To: Difficulty was anticipated, Difficult Airway- due to large tongue and Difficult Airway- due to limited oral opening Comments: Dl x1 with Mil 2.  Unable to see beyond epiglottis.  DL x2 with Glidescope 4.  EBBS and VSS.

## 2017-05-01 NOTE — Anesthesia Preprocedure Evaluation (Signed)
Anesthesia Evaluation  Patient identified by MRN, date of birth, ID band Patient awake    Reviewed: Allergy & Precautions, NPO status , Patient's Chart, lab work & pertinent test results  Airway Mallampati: II  TM Distance: >3 FB Neck ROM: Full    Dental no notable dental hx.    Pulmonary neg pulmonary ROS, sleep apnea and Continuous Positive Airway Pressure Ventilation ,    Pulmonary exam normal breath sounds clear to auscultation       Cardiovascular hypertension, Pt. on medications + Past MI and + Cardiac Stents (2017)  negative cardio ROS Normal cardiovascular exam Rhythm:Regular Rate:Normal     Neuro/Psych negative neurological ROS  negative psych ROS   GI/Hepatic negative GI ROS, Neg liver ROS,   Endo/Other  negative endocrine ROS  Renal/GU negative Renal ROS  negative genitourinary   Musculoskeletal negative musculoskeletal ROS (+)   Abdominal   Peds negative pediatric ROS (+)  Hematology negative hematology ROS (+)   Anesthesia Other Findings   Reproductive/Obstetrics negative OB ROS                             Anesthesia Physical Anesthesia Plan  ASA: III  Anesthesia Plan: General   Post-op Pain Management:    Induction: Intravenous  PONV Risk Score and Plan: 2 and Ondansetron and Dexamethasone  Airway Management Planned: Oral ETT  Additional Equipment:   Intra-op Plan:   Post-operative Plan: Extubation in OR  Informed Consent: I have reviewed the patients History and Physical, chart, labs and discussed the procedure including the risks, benefits and alternatives for the proposed anesthesia with the patient or authorized representative who has indicated his/her understanding and acceptance.   Dental advisory given  Plan Discussed with: CRNA  Anesthesia Plan Comments:         Anesthesia Quick Evaluation

## 2017-05-01 NOTE — Anesthesia Postprocedure Evaluation (Signed)
Anesthesia Post Note  Patient: Richard Hull  Procedure(s) Performed: Procedure(s) (LRB): Anterior Cervical Decompression Fusion Cervical Four-Five Cervical Five-Six (N/A)     Patient location during evaluation: PACU Anesthesia Type: General Level of consciousness: awake and alert Pain management: pain level controlled Vital Signs Assessment: post-procedure vital signs reviewed and stable Respiratory status: spontaneous breathing, nonlabored ventilation, respiratory function stable and patient connected to nasal cannula oxygen Cardiovascular status: blood pressure returned to baseline and stable Postop Assessment: no signs of nausea or vomiting Anesthetic complications: no    Last Vitals:  Vitals:   05/01/17 1354 05/01/17 1421  BP:  128/87  Pulse:  92  Resp:    Temp: (!) 36.4 C (!) 36.4 C  SpO2:  97%    Last Pain:  Vitals:   05/01/17 1354  PainSc: Asleep                 Phillips Grout

## 2017-05-01 NOTE — Progress Notes (Signed)
Placed patient on CPAP for the night via auto-mode.  

## 2017-05-01 NOTE — Progress Notes (Signed)
Vitals:   05/01/17 1330 05/01/17 1343 05/01/17 1354 05/01/17 1421  BP:    128/87  Pulse: 97 95  92  Resp:      Temp:   (!) 97.5 F (36.4 C) (!) 97.5 F (36.4 C)  SpO2: 98% 98%  97%    CBC  Recent Labs  04/30/17 0842  WBC 6.3  HGB 16.2  HCT 48.8  PLT 231   BMET  Recent Labs  05/01/17 0708  NA 138  K 4.3  CL 109  CO2 21*  GLUCOSE 116*  BUN 11  CREATININE 0.75  CALCIUM 8.8*   Patient up and ambulating actively in the halls. Voiding well. Wound clean and dry; no erythema, swelling, or drainage. Patient notes that he is significantly more comfortable now than prior to surgery, and has better mobility of his upper extremities.  Plan: Encouraged to continue to ambulate regularly. Instructions for care and activities following discharge were given to the patient. Prescription for pain medication post discharge prepared.  Hewitt Shorts, MD 05/01/2017, 8:36 PM

## 2017-05-01 NOTE — Transfer of Care (Signed)
Immediate Anesthesia Transfer of Care Note  Patient: ALAND MUSKA  Procedure(s) Performed: Procedure(s) with comments: Anterior Cervical Decompression Fusion Cervical Four-Five Cervical Five-Six (N/A) - Anterior Cervical Decompression Fusion Cervical Four-Five Cervical Five-Six  Patient Location: PACU  Anesthesia Type:General  Level of Consciousness: alert , oriented, drowsy and patient cooperative  Airway & Oxygen Therapy: Patient Spontanous Breathing and Patient connected to face mask oxygen  Post-op Assessment: Report given to RN, Post -op Vital signs reviewed and stable and Patient moving all extremities X 4  Post vital signs: Reviewed and stable  Last Vitals:  Vitals:   05/01/17 0645  BP: (!) 149/94  Pulse: 81  Resp: 18  Temp: 36.9 C  SpO2: 98%    Last Pain: There were no vitals filed for this visit.       Complications: No apparent anesthesia complications

## 2017-05-02 ENCOUNTER — Encounter (HOSPITAL_COMMUNITY): Payer: Self-pay | Admitting: Neurosurgery

## 2017-05-02 DIAGNOSIS — Z79899 Other long term (current) drug therapy: Secondary | ICD-10-CM | POA: Diagnosis not present

## 2017-05-02 DIAGNOSIS — M50121 Cervical disc disorder at C4-C5 level with radiculopathy: Secondary | ICD-10-CM | POA: Diagnosis not present

## 2017-05-02 DIAGNOSIS — K219 Gastro-esophageal reflux disease without esophagitis: Secondary | ICD-10-CM | POA: Diagnosis not present

## 2017-05-02 DIAGNOSIS — F329 Major depressive disorder, single episode, unspecified: Secondary | ICD-10-CM | POA: Diagnosis not present

## 2017-05-02 DIAGNOSIS — Z7982 Long term (current) use of aspirin: Secondary | ICD-10-CM | POA: Diagnosis not present

## 2017-05-02 DIAGNOSIS — I10 Essential (primary) hypertension: Secondary | ICD-10-CM | POA: Diagnosis not present

## 2017-05-02 DIAGNOSIS — E782 Mixed hyperlipidemia: Secondary | ICD-10-CM | POA: Diagnosis not present

## 2017-05-02 DIAGNOSIS — I2510 Atherosclerotic heart disease of native coronary artery without angina pectoris: Secondary | ICD-10-CM | POA: Diagnosis not present

## 2017-05-02 DIAGNOSIS — I252 Old myocardial infarction: Secondary | ICD-10-CM | POA: Diagnosis not present

## 2017-05-02 DIAGNOSIS — G4730 Sleep apnea, unspecified: Secondary | ICD-10-CM | POA: Diagnosis not present

## 2017-05-02 DIAGNOSIS — Z880 Allergy status to penicillin: Secondary | ICD-10-CM | POA: Diagnosis not present

## 2017-05-02 DIAGNOSIS — M4722 Other spondylosis with radiculopathy, cervical region: Secondary | ICD-10-CM | POA: Diagnosis not present

## 2017-05-02 NOTE — Progress Notes (Signed)
Patient alert and oriented, mae's well, voiding adequate amount of urine, swallowing without difficulty, no c/o pain at time of discharge. Patient discharged home with family. Script and discharged instructions given to patient. Patient and family stated understanding of instructions given. Patient has an appointment with Dr. Nudelman 

## 2017-05-02 NOTE — Discharge Instructions (Signed)

## 2017-05-02 NOTE — Discharge Summary (Signed)
Physician Discharge Summary  Patient ID: Richard Hull MRN: [id number] DOB/AGE: 20-Oct-1975 41 y.o.  Admit date: 05/01/2017 Discharge date: 05/02/2017  Admission Diagnoses:  C4-5 and C5-6 cervical disc herniation; cervical spondylosis with radiculopathy; cervical degenerative disc disease; cervical radiculopathy; bilateral upper extremity weakness  Discharge Diagnoses:  C4-5 and C5-6 cervical disc herniation; cervical spondylosis with radiculopathy; cervical degenerative disc disease; cervical radiculopathy; bilateral upper extremity weakness Active Problems:   HNP (herniated nucleus pulposus), cervical   Discharged Condition: good  Hospital Course: Patient was admitted, underwent a 2 level C4-5 and C5-6 anterior cervical decompression and arthrodesis with structural allograft and aviator cervical plating.  Postoperatively he has done well.  He's had significant improvement in his bilateral cervical radiculopathy.  He is up and inability actively.  He is voiding well.  His incision is healing nicely.  There is no swelling, erythema, or drainage.  He is scheduled to follow up with me in about 2 weeks.  Discharge Exam: Blood pressure 137/75, pulse (!) 101, temperature (!) 97.5 F (36.4 C), resp. rate 18, SpO2 99 %.  Disposition: 01-Home or Self Care  Discharge Instructions    Discharge wound care:    Complete by:  As directed    Leave the wound open to air. Shower daily with the wound uncovered. Water and soapy water should run over the incision area. Do not wash directly on the incision for 2 weeks. Remove the glue after 2 weeks.   Driving Restrictions    Complete by:  As directed    No driving for 2 weeks. May ride in the car locally now. May begin to drive locally in 2 weeks.   Other Restrictions    Complete by:  As directed    Walk gradually increasing distances out in the fresh air at least twice a day. Walking additional 6 times inside the house, gradually increasing distances,  daily. No bending, lifting, or twisting. Perform activities between shoulder and waist height (that is at counter height when standing or table height when sitting).     Allergies as of 05/02/2017      Reactions   Amoxicillin Rash   Penicillins Rash, Other (See Comments)   Has patient had a PCN reaction causing immediate rash, facial/tongue/throat swelling, SOB or lightheadedness with hypotension: Yes Has patient had a PCN reaction causing severe rash involving mucus membranes or skin necrosis: No Has patient had a PCN reaction that required hospitalization No Has patient had a PCN reaction occurring within the last 10 years: No If all of the above answers are "NO", then may proceed with Cephalosporin use.      Medication List    TAKE these medications   ALLERGY PO Take 1 tablet by mouth daily as needed (for allergies).   amLODipine 5 MG tablet Commonly known as:  NORVASC TAKE 1 TABLET BY MOUTH  DAILY What changed:  See the new instructions.   aspirin 81 MG EC tablet Take 1 tablet (81 mg total) by mouth daily. What changed:  when to take this   escitalopram 10 MG tablet Commonly known as:  LEXAPRO Take 10 mg by mouth daily.   HYDROcodone-acetaminophen 5-325 MG tablet Commonly known as:  NORCO/VICODIN Take 1-2 tablets by mouth every 4 (four) hours as needed (pain).   irbesartan 300 MG tablet Commonly known as:  AVAPRO Take 300 mg by mouth at bedtime.   metoprolol tartrate 50 MG tablet Commonly known as:  LOPRESSOR Take 1.5 tablets (75 mg total) by mouth 2 (  two) times daily. What changed:  how much to take   multivitamin with minerals Tabs tablet Take 1 tablet by mouth daily.   nitroGLYCERIN 0.4 MG SL tablet Commonly known as:  NITROSTAT Place 1 tablet (0.4 mg total) under the tongue every 5 (five) minutes as needed for chest pain (CP or SOB).   rosuvastatin 40 MG tablet Commonly known as:  CRESTOR Take 1 tablet (40 mg total) by mouth daily. What changed:  when  to take this   ticagrelor 90 MG Tabs tablet Commonly known as:  BRILINTA Take 1 tablet (90 mg total) by mouth 2 (two) times daily.   valsartan 320 MG tablet Commonly known as:  DIOVAN Take 1 tablet (320 mg total) by mouth daily. What changed:  when to take this   VASCEPA 1 g Caps Generic drug:  Icosapent Ethyl TAKE 2 CAPSULES BY MOUTH  TWO TIMES DAILY What changed:  See the new instructions.   vitamin C 500 MG tablet Commonly known as:  ASCORBIC ACID Take 500 mg by mouth 2 (two) times daily.            Discharge Care Instructions        Start     Ordered   05/02/17 0000  Driving Restrictions    Comments:  No driving for 2 weeks. May ride in the car locally now. May begin to drive locally in 2 weeks.   05/02/17 1010   05/02/17 0000  Other Restrictions    Comments:  Walk gradually increasing distances out in the fresh air at least twice a day. Walking additional 6 times inside the house, gradually increasing distances, daily. No bending, lifting, or twisting. Perform activities between shoulder and waist height (that is at counter height when standing or table height when sitting).   05/02/17 1010   05/02/17 0000  Discharge wound care:    Comments:  Leave the wound open to air. Shower daily with the wound uncovered. Water and soapy water should run over the incision area. Do not wash directly on the incision for 2 weeks. Remove the glue after 2 weeks.   05/02/17 1010   05/01/17 0000  HYDROcodone-acetaminophen (NORCO/VICODIN) 5-325 MG tablet  Every 4 hours PRN     05/01/17 2038     Follow-up Information    Shirlean Kelly, MD Follow up.   Specialty:  Neurosurgery Contact information: [address] [place] [address] [phone]           Signed: Hewitt Shorts 05/02/2017, 10:10 AM

## 2017-05-06 ENCOUNTER — Encounter (HOSPITAL_COMMUNITY): Payer: Self-pay | Admitting: Neurosurgery

## 2017-05-17 DIAGNOSIS — M5000 Cervical disc disorder with myelopathy, unspecified cervical region: Secondary | ICD-10-CM | POA: Diagnosis not present

## 2017-05-21 ENCOUNTER — Encounter (HOSPITAL_COMMUNITY): Payer: Self-pay | Admitting: Neurosurgery

## 2017-05-21 DIAGNOSIS — L700 Acne vulgaris: Secondary | ICD-10-CM | POA: Diagnosis not present

## 2017-07-31 ENCOUNTER — Other Ambulatory Visit: Payer: Self-pay | Admitting: Cardiovascular Disease

## 2017-08-02 NOTE — Telephone Encounter (Signed)
REFILL 

## 2017-08-21 DIAGNOSIS — G4733 Obstructive sleep apnea (adult) (pediatric): Secondary | ICD-10-CM | POA: Diagnosis not present

## 2017-09-02 ENCOUNTER — Other Ambulatory Visit: Payer: Self-pay | Admitting: Neurosurgery

## 2017-09-02 DIAGNOSIS — M5000 Cervical disc disorder with myelopathy, unspecified cervical region: Secondary | ICD-10-CM

## 2017-09-02 DIAGNOSIS — Z981 Arthrodesis status: Secondary | ICD-10-CM

## 2017-10-14 ENCOUNTER — Other Ambulatory Visit: Payer: Self-pay | Admitting: Cardiovascular Disease

## 2017-10-16 DIAGNOSIS — G4733 Obstructive sleep apnea (adult) (pediatric): Secondary | ICD-10-CM | POA: Diagnosis not present

## 2017-11-21 DIAGNOSIS — E8881 Metabolic syndrome: Secondary | ICD-10-CM | POA: Diagnosis not present

## 2017-11-21 DIAGNOSIS — E669 Obesity, unspecified: Secondary | ICD-10-CM | POA: Diagnosis not present

## 2017-11-21 DIAGNOSIS — R635 Abnormal weight gain: Secondary | ICD-10-CM | POA: Diagnosis not present

## 2017-11-21 DIAGNOSIS — R7309 Other abnormal glucose: Secondary | ICD-10-CM | POA: Diagnosis not present

## 2018-01-05 ENCOUNTER — Other Ambulatory Visit: Payer: Self-pay | Admitting: Cardiovascular Disease

## 2018-03-19 DIAGNOSIS — G4733 Obstructive sleep apnea (adult) (pediatric): Secondary | ICD-10-CM | POA: Diagnosis not present

## 2018-07-23 DIAGNOSIS — G4733 Obstructive sleep apnea (adult) (pediatric): Secondary | ICD-10-CM | POA: Diagnosis not present

## 2018-11-11 DIAGNOSIS — G4733 Obstructive sleep apnea (adult) (pediatric): Secondary | ICD-10-CM | POA: Diagnosis not present

## 2019-01-03 IMAGING — CR DG CERVICAL SPINE 2 OR 3 VIEWS
2 series · 2 of 2 positions shown · non-contrast
Comparison: MRI 03/26/2017

CLINICAL DATA: ACDF C4-5 and C5-6.

EXAM:
CERVICAL SPINE - 2-3 VIEW

[xtable (1 of 2)]
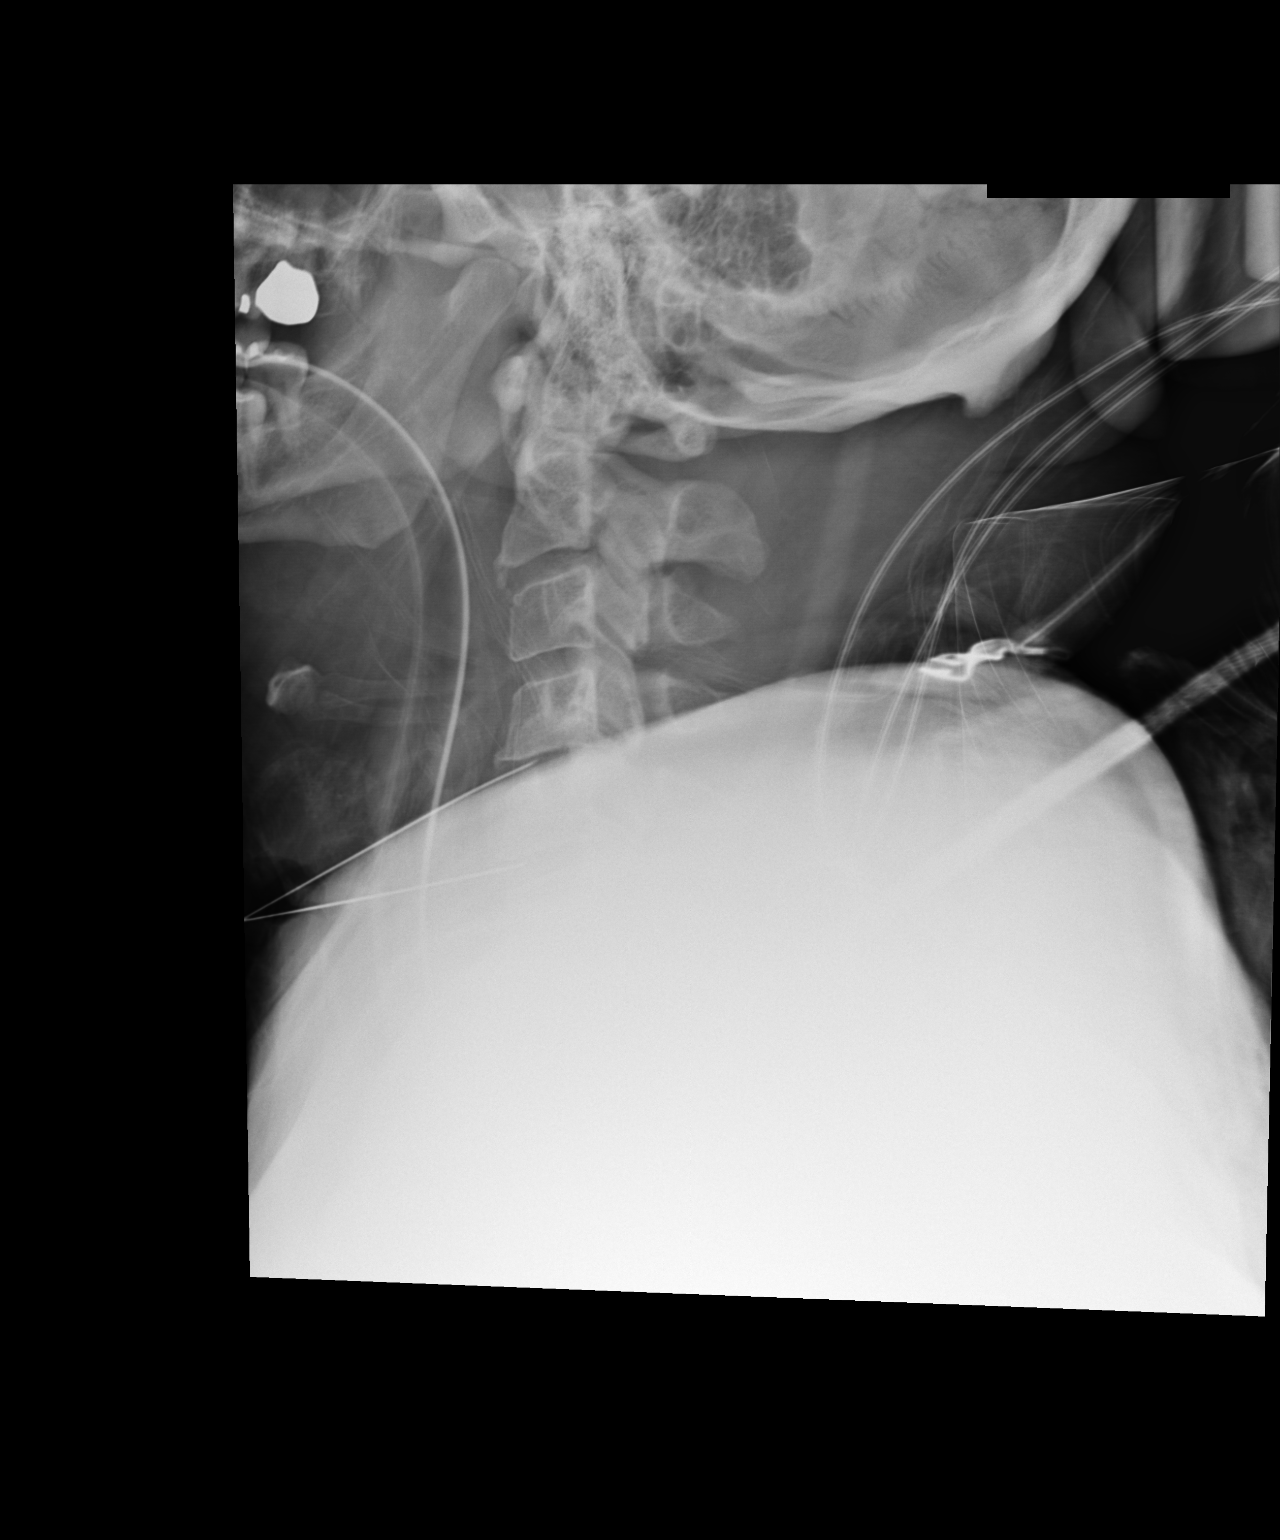

[xtable (2 of 2)]
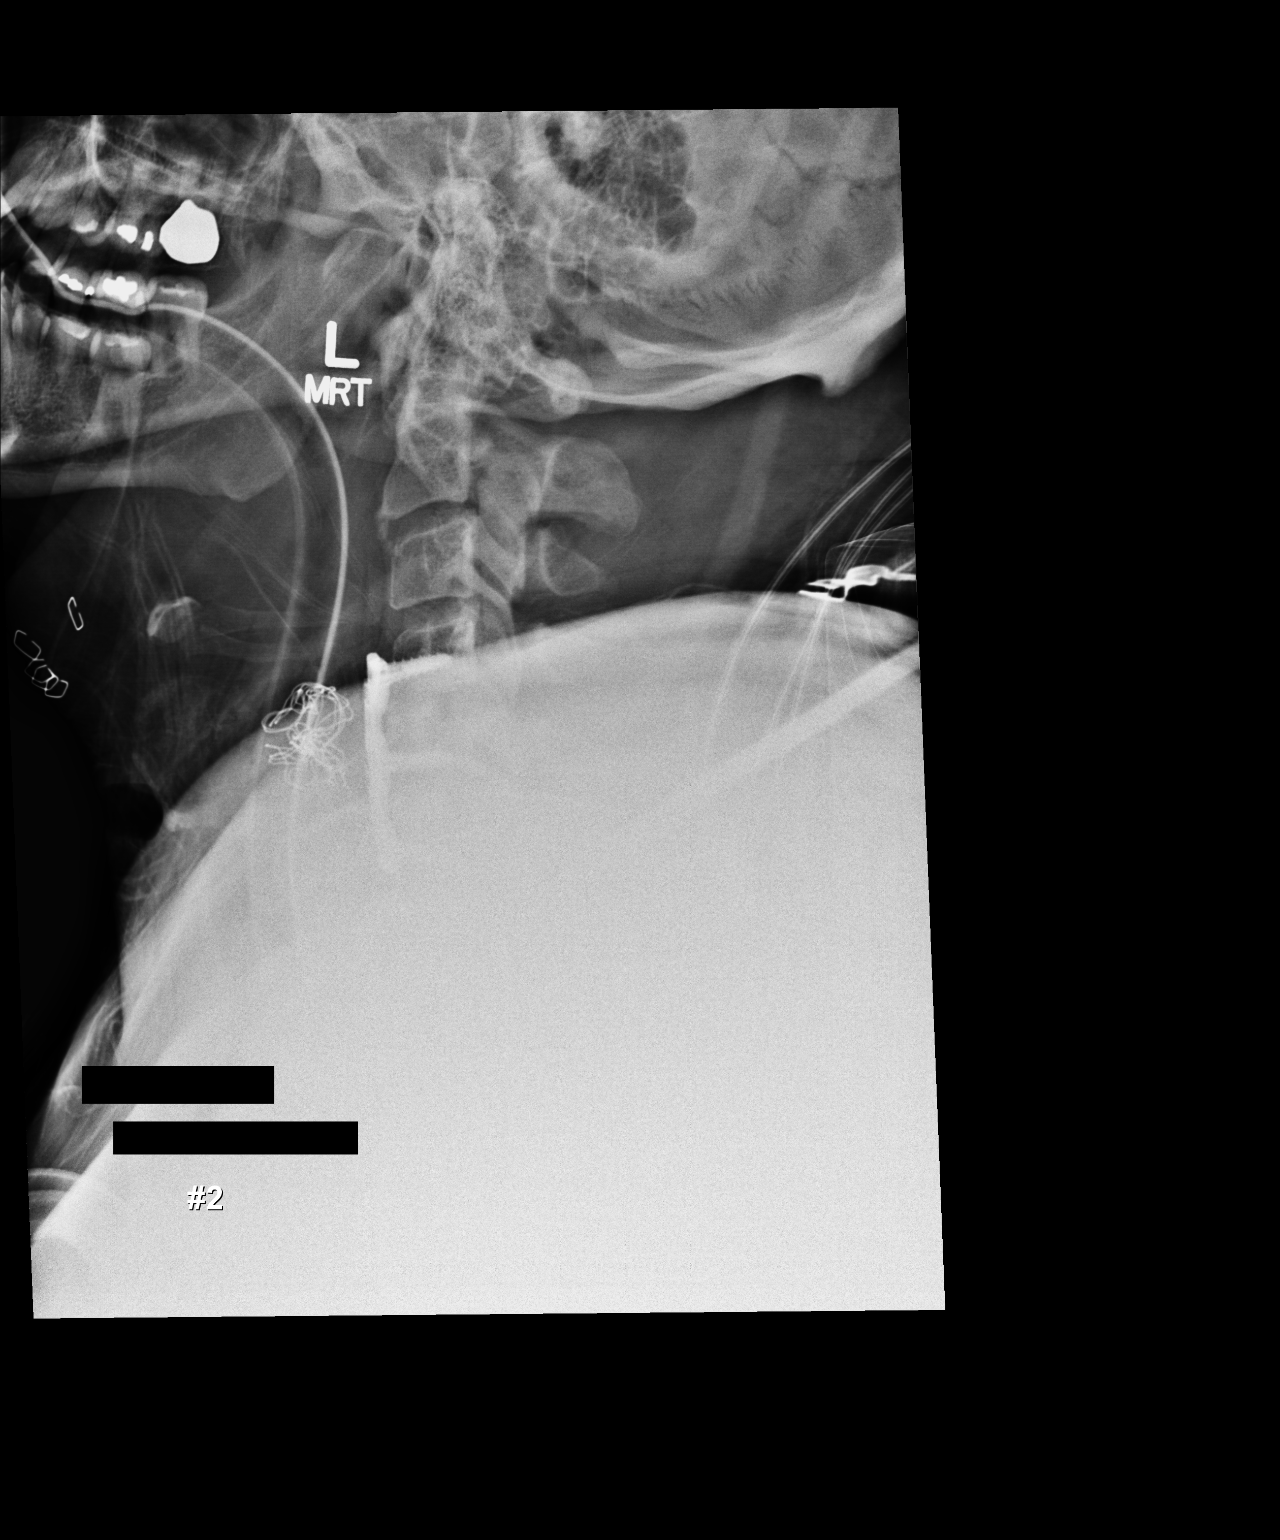

[2 of 2 positions shown; findings below may reference images not displayed]

FINDINGS: Initial films shows a needle at the anterior C4-5 and C5-6 disc
levels. Second film shows ACDF C4-C6 with anterior plate and screws.
Detail is limited by shoulder density. Sponges in place along the
operative approach.
IMPRESSION: ACDF C4-C6 in progress.

## 2019-05-28 DIAGNOSIS — G4733 Obstructive sleep apnea (adult) (pediatric): Secondary | ICD-10-CM | POA: Diagnosis not present

## 2019-07-24 DIAGNOSIS — M5412 Radiculopathy, cervical region: Secondary | ICD-10-CM | POA: Diagnosis not present

## 2019-07-24 DIAGNOSIS — D229 Melanocytic nevi, unspecified: Secondary | ICD-10-CM | POA: Diagnosis not present

## 2019-07-24 DIAGNOSIS — N200 Calculus of kidney: Secondary | ICD-10-CM | POA: Diagnosis not present

## 2019-07-24 DIAGNOSIS — I2510 Atherosclerotic heart disease of native coronary artery without angina pectoris: Secondary | ICD-10-CM | POA: Diagnosis not present

## 2019-09-01 ENCOUNTER — Ambulatory Visit
Admission: RE | Admit: 2019-09-01 | Discharge: 2019-09-01 | Disposition: A | Discharge status: Home / Self Care | Payer: BC Managed Care – PPO | Source: Ambulatory Visit | Attending: Chiropractor | Admitting: Chiropractor

## 2019-09-01 ENCOUNTER — Other Ambulatory Visit: Payer: Self-pay | Admitting: Chiropractor

## 2019-09-01 ENCOUNTER — Other Ambulatory Visit: Payer: Self-pay

## 2019-09-01 DIAGNOSIS — M9905 Segmental and somatic dysfunction of pelvic region: Secondary | ICD-10-CM | POA: Diagnosis not present

## 2019-09-01 DIAGNOSIS — M545 Low back pain, unspecified: Secondary | ICD-10-CM

## 2019-09-01 DIAGNOSIS — M5137 Other intervertebral disc degeneration, lumbosacral region: Secondary | ICD-10-CM | POA: Diagnosis not present

## 2019-09-01 DIAGNOSIS — M9903 Segmental and somatic dysfunction of lumbar region: Secondary | ICD-10-CM | POA: Diagnosis not present

## 2019-09-01 DIAGNOSIS — M5136 Other intervertebral disc degeneration, lumbar region: Secondary | ICD-10-CM | POA: Diagnosis not present

## 2019-09-01 DIAGNOSIS — M47816 Spondylosis without myelopathy or radiculopathy, lumbar region: Secondary | ICD-10-CM | POA: Diagnosis not present

## 2019-09-01 DIAGNOSIS — M25552 Pain in left hip: Secondary | ICD-10-CM | POA: Diagnosis not present

## 2019-09-03 DIAGNOSIS — M5137 Other intervertebral disc degeneration, lumbosacral region: Secondary | ICD-10-CM | POA: Diagnosis not present

## 2019-09-03 DIAGNOSIS — M9903 Segmental and somatic dysfunction of lumbar region: Secondary | ICD-10-CM | POA: Diagnosis not present

## 2019-09-03 DIAGNOSIS — M9905 Segmental and somatic dysfunction of pelvic region: Secondary | ICD-10-CM | POA: Diagnosis not present

## 2019-09-03 DIAGNOSIS — M25552 Pain in left hip: Secondary | ICD-10-CM | POA: Diagnosis not present

## 2019-09-07 DIAGNOSIS — M5137 Other intervertebral disc degeneration, lumbosacral region: Secondary | ICD-10-CM | POA: Diagnosis not present

## 2019-09-07 DIAGNOSIS — M9905 Segmental and somatic dysfunction of pelvic region: Secondary | ICD-10-CM | POA: Diagnosis not present

## 2019-09-07 DIAGNOSIS — M25552 Pain in left hip: Secondary | ICD-10-CM | POA: Diagnosis not present

## 2019-09-07 DIAGNOSIS — M9903 Segmental and somatic dysfunction of lumbar region: Secondary | ICD-10-CM | POA: Diagnosis not present

## 2019-09-08 DIAGNOSIS — M5137 Other intervertebral disc degeneration, lumbosacral region: Secondary | ICD-10-CM | POA: Diagnosis not present

## 2019-09-08 DIAGNOSIS — M9903 Segmental and somatic dysfunction of lumbar region: Secondary | ICD-10-CM | POA: Diagnosis not present

## 2019-09-08 DIAGNOSIS — M9905 Segmental and somatic dysfunction of pelvic region: Secondary | ICD-10-CM | POA: Diagnosis not present

## 2019-09-08 DIAGNOSIS — M25552 Pain in left hip: Secondary | ICD-10-CM | POA: Diagnosis not present

## 2019-09-10 DIAGNOSIS — M9903 Segmental and somatic dysfunction of lumbar region: Secondary | ICD-10-CM | POA: Diagnosis not present

## 2019-09-10 DIAGNOSIS — M5137 Other intervertebral disc degeneration, lumbosacral region: Secondary | ICD-10-CM | POA: Diagnosis not present

## 2019-09-10 DIAGNOSIS — M9905 Segmental and somatic dysfunction of pelvic region: Secondary | ICD-10-CM | POA: Diagnosis not present

## 2019-09-10 DIAGNOSIS — M25552 Pain in left hip: Secondary | ICD-10-CM | POA: Diagnosis not present

## 2019-09-14 DIAGNOSIS — M9903 Segmental and somatic dysfunction of lumbar region: Secondary | ICD-10-CM | POA: Diagnosis not present

## 2019-09-14 DIAGNOSIS — M9905 Segmental and somatic dysfunction of pelvic region: Secondary | ICD-10-CM | POA: Diagnosis not present

## 2019-09-14 DIAGNOSIS — M25552 Pain in left hip: Secondary | ICD-10-CM | POA: Diagnosis not present

## 2019-09-14 DIAGNOSIS — M5137 Other intervertebral disc degeneration, lumbosacral region: Secondary | ICD-10-CM | POA: Diagnosis not present

## 2019-09-15 DIAGNOSIS — M25552 Pain in left hip: Secondary | ICD-10-CM | POA: Diagnosis not present

## 2019-09-15 DIAGNOSIS — M9903 Segmental and somatic dysfunction of lumbar region: Secondary | ICD-10-CM | POA: Diagnosis not present

## 2019-09-15 DIAGNOSIS — M5137 Other intervertebral disc degeneration, lumbosacral region: Secondary | ICD-10-CM | POA: Diagnosis not present

## 2019-09-15 DIAGNOSIS — M9905 Segmental and somatic dysfunction of pelvic region: Secondary | ICD-10-CM | POA: Diagnosis not present

## 2019-09-16 DIAGNOSIS — M9903 Segmental and somatic dysfunction of lumbar region: Secondary | ICD-10-CM | POA: Diagnosis not present

## 2019-09-16 DIAGNOSIS — M9905 Segmental and somatic dysfunction of pelvic region: Secondary | ICD-10-CM | POA: Diagnosis not present

## 2019-09-16 DIAGNOSIS — M25552 Pain in left hip: Secondary | ICD-10-CM | POA: Diagnosis not present

## 2019-09-16 DIAGNOSIS — M5137 Other intervertebral disc degeneration, lumbosacral region: Secondary | ICD-10-CM | POA: Diagnosis not present

## 2019-09-21 DIAGNOSIS — M25552 Pain in left hip: Secondary | ICD-10-CM | POA: Diagnosis not present

## 2019-09-21 DIAGNOSIS — M9905 Segmental and somatic dysfunction of pelvic region: Secondary | ICD-10-CM | POA: Diagnosis not present

## 2019-09-21 DIAGNOSIS — M5137 Other intervertebral disc degeneration, lumbosacral region: Secondary | ICD-10-CM | POA: Diagnosis not present

## 2019-09-21 DIAGNOSIS — M9903 Segmental and somatic dysfunction of lumbar region: Secondary | ICD-10-CM | POA: Diagnosis not present

## 2019-09-22 DIAGNOSIS — M25552 Pain in left hip: Secondary | ICD-10-CM | POA: Diagnosis not present

## 2019-09-22 DIAGNOSIS — M9903 Segmental and somatic dysfunction of lumbar region: Secondary | ICD-10-CM | POA: Diagnosis not present

## 2019-09-22 DIAGNOSIS — M9905 Segmental and somatic dysfunction of pelvic region: Secondary | ICD-10-CM | POA: Diagnosis not present

## 2019-09-22 DIAGNOSIS — M5137 Other intervertebral disc degeneration, lumbosacral region: Secondary | ICD-10-CM | POA: Diagnosis not present

## 2019-09-28 DIAGNOSIS — M9905 Segmental and somatic dysfunction of pelvic region: Secondary | ICD-10-CM | POA: Diagnosis not present

## 2019-09-28 DIAGNOSIS — M25552 Pain in left hip: Secondary | ICD-10-CM | POA: Diagnosis not present

## 2019-09-28 DIAGNOSIS — M5137 Other intervertebral disc degeneration, lumbosacral region: Secondary | ICD-10-CM | POA: Diagnosis not present

## 2019-09-28 DIAGNOSIS — M9903 Segmental and somatic dysfunction of lumbar region: Secondary | ICD-10-CM | POA: Diagnosis not present

## 2019-09-29 DIAGNOSIS — M5137 Other intervertebral disc degeneration, lumbosacral region: Secondary | ICD-10-CM | POA: Diagnosis not present

## 2019-09-29 DIAGNOSIS — M25552 Pain in left hip: Secondary | ICD-10-CM | POA: Diagnosis not present

## 2019-09-29 DIAGNOSIS — M9903 Segmental and somatic dysfunction of lumbar region: Secondary | ICD-10-CM | POA: Diagnosis not present

## 2019-09-29 DIAGNOSIS — M9905 Segmental and somatic dysfunction of pelvic region: Secondary | ICD-10-CM | POA: Diagnosis not present

## 2019-10-01 DIAGNOSIS — M25552 Pain in left hip: Secondary | ICD-10-CM | POA: Diagnosis not present

## 2019-10-01 DIAGNOSIS — M5137 Other intervertebral disc degeneration, lumbosacral region: Secondary | ICD-10-CM | POA: Diagnosis not present

## 2019-10-01 DIAGNOSIS — M9903 Segmental and somatic dysfunction of lumbar region: Secondary | ICD-10-CM | POA: Diagnosis not present

## 2019-10-01 DIAGNOSIS — M9905 Segmental and somatic dysfunction of pelvic region: Secondary | ICD-10-CM | POA: Diagnosis not present

## 2019-10-09 DIAGNOSIS — G4733 Obstructive sleep apnea (adult) (pediatric): Secondary | ICD-10-CM | POA: Diagnosis not present

## 2019-12-22 DIAGNOSIS — M654 Radial styloid tenosynovitis [de Quervain]: Secondary | ICD-10-CM | POA: Diagnosis not present

## 2020-01-21 DIAGNOSIS — M654 Radial styloid tenosynovitis [de Quervain]: Secondary | ICD-10-CM | POA: Diagnosis not present

## 2020-02-26 DIAGNOSIS — R739 Hyperglycemia, unspecified: Secondary | ICD-10-CM | POA: Diagnosis not present

## 2020-02-26 DIAGNOSIS — R946 Abnormal results of thyroid function studies: Secondary | ICD-10-CM | POA: Diagnosis not present

## 2020-02-26 DIAGNOSIS — Z0000 Encounter for general adult medical examination without abnormal findings: Secondary | ICD-10-CM | POA: Diagnosis not present

## 2020-02-26 DIAGNOSIS — E7849 Other hyperlipidemia: Secondary | ICD-10-CM | POA: Diagnosis not present

## 2020-03-11 DIAGNOSIS — F4321 Adjustment disorder with depressed mood: Secondary | ICD-10-CM | POA: Diagnosis not present

## 2020-03-11 DIAGNOSIS — R82998 Other abnormal findings in urine: Secondary | ICD-10-CM | POA: Diagnosis not present

## 2020-03-11 DIAGNOSIS — F5221 Male erectile disorder: Secondary | ICD-10-CM | POA: Diagnosis not present

## 2020-03-11 DIAGNOSIS — Z1212 Encounter for screening for malignant neoplasm of rectum: Secondary | ICD-10-CM | POA: Diagnosis not present

## 2020-03-11 DIAGNOSIS — Z23 Encounter for immunization: Secondary | ICD-10-CM | POA: Diagnosis not present

## 2020-03-11 DIAGNOSIS — M545 Low back pain: Secondary | ICD-10-CM | POA: Diagnosis not present

## 2020-03-11 DIAGNOSIS — Z0000 Encounter for general adult medical examination without abnormal findings: Secondary | ICD-10-CM | POA: Diagnosis not present

## 2020-03-11 DIAGNOSIS — M79642 Pain in left hand: Secondary | ICD-10-CM | POA: Diagnosis not present

## 2020-04-07 DIAGNOSIS — G4733 Obstructive sleep apnea (adult) (pediatric): Secondary | ICD-10-CM | POA: Diagnosis not present

## 2020-06-07 ENCOUNTER — Ambulatory Visit: Payer: BC Managed Care – PPO | Admitting: Cardiovascular Disease

## 2020-07-16 DIAGNOSIS — G4733 Obstructive sleep apnea (adult) (pediatric): Secondary | ICD-10-CM | POA: Diagnosis not present

## 2020-07-19 DIAGNOSIS — M545 Low back pain, unspecified: Secondary | ICD-10-CM | POA: Diagnosis not present

## 2020-07-27 ENCOUNTER — Ambulatory Visit (INDEPENDENT_AMBULATORY_CARE_PROVIDER_SITE_OTHER): Payer: BC Managed Care – PPO | Admitting: Cardiovascular Disease

## 2020-07-27 ENCOUNTER — Other Ambulatory Visit: Payer: Self-pay

## 2020-07-27 ENCOUNTER — Encounter: Payer: Self-pay | Admitting: Cardiovascular Disease

## 2020-07-27 DIAGNOSIS — I10 Essential (primary) hypertension: Secondary | ICD-10-CM

## 2020-07-27 DIAGNOSIS — G4733 Obstructive sleep apnea (adult) (pediatric): Secondary | ICD-10-CM | POA: Diagnosis not present

## 2020-07-27 DIAGNOSIS — I2111 ST elevation (STEMI) myocardial infarction involving right coronary artery: Secondary | ICD-10-CM

## 2020-07-27 DIAGNOSIS — Z6836 Body mass index (BMI) 36.0-36.9, adult: Secondary | ICD-10-CM

## 2020-07-27 DIAGNOSIS — E782 Mixed hyperlipidemia: Secondary | ICD-10-CM

## 2020-07-27 MED ORDER — NITROGLYCERIN 0.4 MG SL SUBL: 0.4000 mg | SUBLINGUAL_TABLET | SUBLINGUAL | 12 refills | Status: AC | PRN

## 2020-07-27 MED ORDER — OLMESARTAN MEDOXOMIL 20 MG PO TABS: 20.0000 mg | ORAL_TABLET | Freq: Every day | ORAL | 0 refills | Status: DC

## 2020-07-27 NOTE — Progress Notes (Signed)
Patient ID: Richard Hull, male   DOB: May 16, 1976, 44 y.o.   MRN: [id number]    Primary M.D.: Dr. Shon Baton  HPI: Richard Hull is a 44 y.o. male who presents to the office today for a 3 1/2 year follow up cardiology/sleep evaluation.  Mr. Richard Hull has a history of hyperlipidemia as well as hypertension.  On the morning of 09/15/2015 he was awakened from sleep with severe substernal chest pressure.  He presented to [facility] hospital emergency room where his ECG showed sinus tachycardia with inferior ST segment elevation.  A code STEMI was activated.  He underwent emergent cardiac catheterization by me, which revealed an ulcerated plaque in a large large dominant RCA with distal embolization to the PDA.  Intervention was performed and he ultimately underwent stenting of his RCA proximal to the PDA with insertion of a 3.515 mm Xience Alpine DES stent postdilated 3.75 mm and PTCA of the subtotal mid PDA, which was small caliber and dilated with a 20 balloon.  He was found to have mild concomitant CAD with 30% narrowing in the mid to distal LAD and proximal 30% stenosis.  A subsequent echo done on 09/18/2015 showed an EF of 65-70% without wall motion abnormality.  There was grade 1 diastolic dysfunction.  He was seen for post hospital initial evaluation with Richard Hull.  He had developed a dry cough, which was felt to be due to lisinopril, but this ultimately resolved and he has been maintained on lisinopril 40 mg daily, metoprolol 50 mg twice a day, atorvastatin 80 mg with Zetia 10 mg.  He continues to be on dual antiplatelet therapy with aspirin and Brilinta.  He has been taking over-the-counter fish oil.  He has a history of markedly triglycerides and was as high as 1200 several years ago.  When I saw him I was concerned about obstructive sleep apnea in the etiology of his early a.m. MI which awakened him from sleep, suggesting the potential for oxygen desaturation during grams sleep.  I referred  him for a sleep study which confirmed my suspicion.  He was found to have severe obstructive sleep apnea with an AHI of 46.8 per hour.  Events were more severe with supine sleep.  He had abnormal sleep architecture with absence of slow-wave sleep and in rem sleep.  On the diagnostic portion of the study.  With CPAP.  He had significant rim rebound.  He had loud snoring.  During the diagnostic portion of the study.  He'll total.  He was titrated up to 10 cm water pressure has been on CPAP therapy since.  A download was obtained from his area 10 AutoSet ResMed CPAP unit from 05/12/2016 through October 2 22 2017.  He is meeting compliance standards.  Usage was 93%.  There were some days where the patient has awakened and his mask was off, but he did not recall taking it off.  At 10 cm water pressure.  AHI is excellent at 1.7 per hour.  He is unaware of any breakthrough snoring.  His sleep is now restorative.  Previously he had significant nocturia, which has essentially resolved.  He continues to have100% CPAP compliance.  He is awaiting a new mask since his current one has developed a significant leak.  When I last saw him in April 2018 as result of cervical disc disease he was having to undergo future  C4-C6 cervical disc surgery by Dr. Rita Ohara.  When I had seen him previously, I recommended that  he wait at least one year following his acute coronary syndrome so that  dual platelet therapy be continued for one-year duration following his ACS.  A preoperative new nuclear perfusion study showed an EF at 54%.  There was a medium defect with mild ischemia in the basal inferior, mid inferior location.  I had recommended that he return to discuss this result.  He could not make the scheduled date that was initially recommended.  He comes to the office today, 2 months later for reevaluation.  He states he will require cervical neck surgery and has noticed slight progression of his left arm paresthesias.  He denied chest  pain.  He never had chest pain prior to his MI.  He had noticed some mild exertional dyspnea.   Since I last saw him 3-1/2 years ago, he states he has been doing fairly well.  He continues to work in a Therapist, sports in the produce department.  He has had some issues with low back discomfort involving his left hip and has been seeing an orthopedist at [facility].  He admits to significant weight gain as result of Covid.  He had reduced his weight down to 190 and 219 and his weight is now increased to 245 pounds.  He missed several doses of his medication over the weekend and noted significant blood pressure elevation to 182/124.  He had not been routinely exercising.  His 12 year old son was diagnosed with Crohn's disease and to obtaining a diagnosis there was significant increased stress.  He has continued to be on CPAP therapy and cannot sleep without it. Presently he has been on amlodipine 10 mg, metoprolol tartrate 50 mg twice a day.  He has been on rosuvastatin 40 mg and over-the-counter fish oil 2 g twice a day.  He presents for evaluation.   Past Medical History:  Diagnosis Date  . Cervical radiculopathy at C5 01/27/2016   left  . Coronary artery disease 08/2015  . Depression   . GERD (gastroesophageal reflux disease)   . History of kidney stones   . Hyperlipidemia LDL goal <70   . Hypertension   . Hypertriglyceridemia   . Left arm weakness   . Myocardial infarction ([place]) 08/2015  . OCD (obsessive compulsive disorder)   . Sleep apnea     Past Surgical History:  Procedure Laterality Date  . ANTERIOR CERVICAL DECOMP/DISCECTOMY FUSION N/A 05/01/2017   Procedure: Anterior Cervical Decompression Fusion Cervical Four-Five Cervical Five-Six;  Surgeon: Jovita Gamma, MD;  Location: [facility];  Service: Neurosurgery;  Laterality: N/A;  Anterior Cervical Decompression Fusion Cervical Four-Five Cervical Five-Six  . CARDIAC CATHETERIZATION N/A 09/15/2015   Procedure: Left Heart  Cath and Coronary Angiography;  Surgeon: Troy Sine, MD; LAD 30%, pRCA 30%, dRCA 30%, RPDA 99%, nl LV function   . CARDIAC CATHETERIZATION N/A 09/15/2015   Procedure: Coronary Stent Intervention;  Surgeon: Troy Sine, MD; 3.515 mm Xience Alpine DES to dRCA/RPDA>> 5% residual     . LEFT HEART CATH AND CORONARY ANGIOGRAPHY N/A 11/28/2016   Procedure: Left Heart Cath and Coronary Angiography;  Surgeon: Troy Sine, MD;  Location: [facility];  Service: Cardiovascular;  Laterality: N/A;  . TONSILLECTOMY      Allergies  Allergen Reactions  . Amoxicillin Rash  . Penicillins Rash and Other (See Comments)    Has patient had a PCN reaction causing immediate rash, facial/tongue/throat swelling, SOB or lightheadedness with hypotension: Yes Has patient had a PCN reaction causing severe  rash involving mucus membranes or skin necrosis: No Has patient had a PCN reaction that required hospitalization No Has patient had a PCN reaction occurring within the last 10 years: No If all of the above answers are "NO", then may proceed with Cephalosporin use.     Current Outpatient Medications  Medication Sig Dispense Refill  . amLODipine (NORVASC) 10 MG tablet Take 10 mg by mouth daily.    Marland Kitchen aspirin EC 81 MG EC tablet Take 1 tablet (81 mg total) by mouth daily. (Patient taking differently: Take 81 mg by mouth every evening. )    . escitalopram (LEXAPRO) 10 MG tablet Take 10 mg by mouth daily.    . metoprolol tartrate (LOPRESSOR) 50 MG tablet Take by mouth 2 (two) times daily.    . Multiple Vitamin (MULTIVITAMIN WITH MINERALS) TABS tablet Take 1 tablet by mouth daily.    . nitroGLYCERIN (NITROSTAT) 0.4 MG SL tablet Place 1 tablet (0.4 mg total) under the tongue every 5 (five) minutes as needed for chest pain (CP or SOB). 25 tablet 12  . olmesartan (BENICAR) 20 MG tablet Take 1 tablet (20 mg total) by mouth daily. 90 tablet 0  . rosuvastatin (CRESTOR) 40 MG tablet TAKE 1 TABLET BY MOUTH  DAILY 90  tablet 0   No current facility-administered medications for this visit.    Social History   Socioeconomic History  . Marital status: Legally Separated    Spouse name: Not on file  . Number of children: 3  . Years of education: Some Coll  . Highest education level: Not on file  Occupational History  . Occupation: Lawyer: [employer]  Tobacco Use  . Smoking status: Never Smoker  . Smokeless tobacco: Never Used  Vaping Use  . Vaping Use: Never used  Substance and Sexual Activity  . Alcohol use: Yes    Alcohol/week: 0.0 standard drinks    Comment: Rarely  . Drug use: No  . Sexual activity: Yes  Other Topics Concern  . Not on file  Social History Narrative   Multiple family members on his father's side have/had CAD at a young age, uncles and grandparents.   Lives at home with his wife and three children.   Right-handed.   2-3 cups caffeine per week.   Social Determinants of Health   Financial Resource Strain:   . Difficulty of Paying Living Expenses: Not on file  Food Insecurity:   . Worried About Charity fundraiser in the Last Year: Not on file  . Ran Out of Food in the Last Year: Not on file  Transportation Needs:   . Lack of Transportation (Medical): Not on file  . Lack of Transportation (Non-Medical): Not on file  Physical Activity:   . Days of Exercise per Week: Not on file  . Minutes of Exercise per Session: Not on file  Stress:   . Feeling of Stress : Not on file  Social Connections:   . Frequency of Communication with Friends and Family: Not on file  . Frequency of Social Gatherings with Friends and Family: Not on file  . Attends Religious Services: Not on file  . Active Member of Clubs or Organizations: Not on file  . Attends Archivist Meetings: Not on file  . Marital Status: Not on file  Intimate Partner Violence:   . Fear of Current or Ex-Partner: Not on file  . Emotionally Abused: Not on file  . Physically Abused:  Not on file  . 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  When I saw him in 2018 he and his wife had separated.  He has 3 children.  His 37 year old son was diagnosed with Crohn's disease.  Family History  Problem Relation Age of Onset  . Heart attack Father 66       approx age of first stent  . Stroke Father   . Hypertension Father   . Skin cancer Mother   . Hyperlipidemia Sister   . Stroke Paternal Grandmother   . Hypertension Paternal Grandmother   . Hypertension Maternal Grandmother   . Hypertension Maternal Grandfather   . Diabetes Maternal Grandfather   . Hypertension Paternal Grandfather   . Diabetes Paternal Grandfather   . Heart disease Paternal Grandfather     ROS General: Negative; No fevers, chills, or night sweats HEENT: Negative; No changes in vision or hearing, sinus congestion, difficulty swallowing Pulmonary: Negative; No cough, wheezing, shortness of breath, hemoptysis Cardiovascular: See HPI: No chest pain, presyncope, syncope, palpatations GI: Negative; No nausea, vomiting, diarrhea, or abdominal pain GU: Negative; No dysuria, hematuria, or difficulty voiding Musculoskeletal: He is in need for cervical C3-6 surgery.   Hematologic: Negative; no easy bruising, bleeding Endocrine: Negative; no heat/cold intolerance; no diabetes, Neuro: Negative; no changes in balance, headaches Skin: Negative; No rashes or skin lesions Psychiatric: Negative; No behavioral problems, depression Sleep: Positive for snoring, and occasional daytime sleepiness, Now on CPAP therapy with resolution of symptoms; no bruxism, restless legs, hypnogognic hallucinations. Other comprehensive 14 point system review is negative   Physical Exam BP (!) 176/74   Pulse 79   Ht 5' 9"  (1.753 m)   Wt 245 lb (111.1 kg)   SpO2 99%   BMI 36.18 kg/m    The blood pressure was elevated 168/88  Wt  Readings from Last 3 Encounters:  07/27/20 245 lb (111.1 kg)  04/30/17 243 lb (110.2 kg)  11/28/16 217 lb (98.4 kg)   When I had seen him in 2018 he weighed 221. He had purposeful weight loss and his  weight had dropped to 190 pounds in 2019.   Weight today is significantly increased at 245.  General: Alert, oriented, no distress.  Skin: normal turgor, no rashes, warm and dry HEENT: Normocephalic, atraumatic. Pupils equal round and reactive to light; sclera anicteric; extraocular muscles intact;  Nose without nasal septal hypertrophy Mouth/Parynx benign; Mallinpatti scale 3 Neck: No JVD, no carotid bruits; normal carotid upstroke Lungs: clear to ausculatation and percussion; no wheezing or rales Chest wall: without tenderness to palpitation Heart: PMI not displaced, RRR, s1 s2 normal, 1/6 systolic murmur, no diastolic murmur, no rubs, gallops, thrills, or heaves Abdomen: soft, nontender; no hepatosplenomehaly, BS+; abdominal aorta nontender and not dilated by palpation. Back: no CVA tenderness Pulses 2+ Musculoskeletal: full range of motion, normal strength, no joint deformities Extremities: no clubbing cyanosis or edema, Homan's sign negative  Neurologic: grossly nonfocal; Cranial nerves grossly wnl Psychologic: Normal mood and affect   ECG (independently read by me): Normal sinus rhythm at 79 bpm.  T wave inversion in lead III.  No ectopy.  Normal intervals.  November 20, 2016  ECG (independently read by me): Normal sinus rhythm at 63 bpm.  Mild T wave inversion in III.  January 2018 ECG (independently read by me): Normal sinus rhythm at 78 bpm.  PR interval 142 ms, QTc  interval 435 ms.  No ECG evidence for his prior myocardial infarction.  October 2017 ECG (independently read by me): Normal sinus rhythm at 91 bpm.  No ectopy.  Normal intervals.  May 2017 ECG (independently read by me): Normal sinus rhythm at 73 bpm.  Small nondiagnostic inferior Q waves with T-wave  inversion.  LABS:  BMP Latest Ref Rng & Units 05/01/2017 11/28/2016 11/26/2016  Glucose 65 - 99 mg/dL 116(H) - 89  BUN 6 - 20 mg/dL 11 - 23  Creatinine 0.61 - 1.24 mg/dL 0.75 - 0.77  Sodium 135 - 145 mmol/L 138 - 139  Potassium 3.5 - 5.1 mmol/L 4.3 4.0 5.5(H)  Chloride 101 - 111 mmol/L 109 - 107  CO2 22 - 32 mmol/L 21(L) - 23  Calcium 8.9 - 10.3 mg/dL 8.8(L) - 9.1     Hepatic Function Latest Ref Rng & Units 09/15/2015 03/08/2014  Total Protein 6.5 - 8.1 g/dL 6.4(L) 7.1  Albumin 3.5 - 5.0 g/dL 4.0 4.5  AST 15 - 41 U/L 36 31  ALT 17 - 63 U/L 54 53  Alk Phosphatase 38 - 126 U/L 66 84  Total Bilirubin 0.3 - 1.2 mg/dL 0.8 0.3    CBC Latest Ref Rng & Units 04/30/2017 11/26/2016 09/16/2015  WBC 4.0 - 10.5 K/uL 6.3 8.0 10.9(H)  Hemoglobin 13.0 - 17.0 g/dL 16.2 14.8 13.7  Hematocrit 39 - 52 % 48.8 44.2 40.8  Platelets 150 - 400 K/uL 231 238 223   Lab Results  Component Value Date   MCV 83.1 04/30/2017   MCV 81.7 11/26/2016   MCV 82.1 09/16/2015    Lab Results  Component Value Date   TSH 5.921 (H) 09/17/2015    BNP No results found for: BNP  ProBNP No results found for: PROBNP   Lipid Panel     Component Value Date/Time   CHOL 216 (H) 09/16/2015 0335   TRIG 635 (H) 09/16/2015 0335   HDL 32 (L) 09/16/2015 0335   CHOLHDL 6.8 09/16/2015 0335   VLDL UNABLE TO CALCULATE IF TRIGLYCERIDE OVER 400 mg/dL 09/16/2015 0335   LDLCALC UNABLE TO CALCULATE IF TRIGLYCERIDE OVER 400 mg/dL 09/16/2015 0335     RADIOLOGY: No results found.  IMPRESSION:  1. ST elevation (STEMI) myocardial infarction involving right coronary artery ([place])   2. OSA (obstructive sleep apnea) on CPAP   3. Mixed hyperlipidemia   4. Class 2 severe obesity due to excess calories with serious comorbidity and body mass index (BMI) of 36.0 to 36.9 in adult [facility])     ASSESSMENT AND PLAN: Mr. Holt Woolbright is a 44 year old male who had a history of previously untreated hyperlipidemia, as well as hypertension.  He  was awakened from sleep on the morning of 09/15/2015 with chest pain and was found to have inferior ST elevation.  He underwent successful emergent catheterization and intervention to an ulcerated plaque in the RCA proximal to the PDA takeoff and there was evidence for distal embolization of clot to the PDA.  He underwent successful stenting at the site of the ulcerated plaque and PTCA in the distal PDA.  Since his MI awakened him from sleep when I saw him in follow-up I was concerned about sleep apnea which may have been contributory to his symptomatology.  This may have contributed to increased inflammation with ultimate plaque rupture during potential hypoxemia.  He was found to have severe obstructive sleep apnea with an AHI of 46.8 per hour.   CPAP therapy was initiated and he is  continue to use CPAP excellent compliance and does not sleep without it.  A download today from November 7 through July 25, 2020 shows 100% compliance with average use at 7 hours.  He is CPAP auto is set at a minimum pressure of 10 and maximum of 20 and his 95th percentile pressure is 12.7 with a maximum average pressure at 14.1.  AHI is 3.4.  He continues to note the benefits of CPAP with being more alert, no longer snoring, marked improvement in previous significant nocturia and lack of daytime sleepiness.  He has had issues with hypertension.  His blood pressure today is elevated.  He had run out of some medication.  He has been on amlodipine 10 mg, metoprolol tartrate 50 mg twice a day.  I will initiate therapy with olmesartan 20 mg but if he is unable to start based on insurance I will reinitiate  valsartan which he had been on in the past.  He has significant mixed hyperlipidemia.  Laboratory on July 9 has shown a total cholesterol 183, triglycerides 482, HDL was low at 35.  LDL was 52.  However I suspect he has small dense LDL subparticles and particle number may actually be increased.  He will be following up with his  primary care provider [name] who can assess the effects of the added hypertensive medical regimen.  I will see him in 4 to 6 months for follow-up evaluation.  Troy Sine, MD, [facility]  07/27/2020 9:21 AM

## 2020-07-27 NOTE — Patient Instructions (Signed)
Medication Instructions:  BEGIN taking olmesartan 20 mg once a day *If you need a refill on your cardiac medications before your next appointment, please call your pharmacy*   Lab Work: Lab work with your PCP If you have labs (blood work) drawn today and your tests are completely normal, you will receive your results only by: Marland Kitchen MyChart Message (if you have MyChart) OR . A paper copy in the mail If you have any lab test that is abnormal or we need to change your treatment, we will call you to review the results.   Testing/Procedures: None ordered   Follow-Up: At [facility], you and your health needs are our priority.  As part of our continuing mission to provide you with exceptional heart care, we have created designated Provider Care Teams.  These Care Teams include your primary Cardiologist (physician) and Advanced Practice Providers (APPs -  Physician Assistants and Nurse Practitioners) who all work together to provide you with the care you need, when you need it.  We recommend signing up for the patient portal called "MyChart".  Sign up information is provided on this After Visit Summary.  MyChart is used to connect with patients for Virtual Visits (Telemedicine).  Patients are able to view lab/test results, encounter notes, upcoming appointments, etc.  Non-urgent messages can be sent to your provider as well.   To learn more about what you can do with MyChart, go to [email or web address].    Your next appointment:   6 month(s)  The format for your next appointment:   In Person  Provider:   Nicki Guadalajara, MD   Other Instructions None

## 2020-07-28 ENCOUNTER — Encounter: Payer: Self-pay | Admitting: Cardiovascular Disease

## 2020-08-05 DIAGNOSIS — M545 Low back pain, unspecified: Secondary | ICD-10-CM | POA: Diagnosis not present

## 2020-08-22 DIAGNOSIS — M545 Low back pain, unspecified: Secondary | ICD-10-CM | POA: Diagnosis not present

## 2020-08-22 DIAGNOSIS — M5442 Lumbago with sciatica, left side: Secondary | ICD-10-CM | POA: Diagnosis not present

## 2020-08-23 ENCOUNTER — Other Ambulatory Visit: Payer: Self-pay | Admitting: Family Medicine

## 2020-08-23 DIAGNOSIS — M5416 Radiculopathy, lumbar region: Secondary | ICD-10-CM

## 2020-09-06 ENCOUNTER — Inpatient Hospital Stay: Admission: RE | Admit: 2020-09-06 | Payer: BC Managed Care – PPO | Source: Ambulatory Visit

## 2020-09-07 ENCOUNTER — Ambulatory Visit
Admission: RE | Admit: 2020-09-07 | Discharge: 2020-09-07 | Disposition: A | Discharge status: Home / Self Care | Payer: BC Managed Care – PPO | Source: Ambulatory Visit | Attending: Family Medicine | Admitting: Family Medicine

## 2020-09-07 ENCOUNTER — Other Ambulatory Visit: Payer: Self-pay

## 2020-09-07 DIAGNOSIS — M48061 Spinal stenosis, lumbar region without neurogenic claudication: Secondary | ICD-10-CM | POA: Diagnosis not present

## 2020-09-07 DIAGNOSIS — M5416 Radiculopathy, lumbar region: Secondary | ICD-10-CM

## 2020-09-07 DIAGNOSIS — M5126 Other intervertebral disc displacement, lumbar region: Secondary | ICD-10-CM | POA: Diagnosis not present

## 2020-09-07 MED ORDER — IOPAMIDOL (ISOVUE-M 200) INJECTION 41%
1.0000 mL | Freq: Once | INTRAMUSCULAR | Status: AC
  Administered 2020-09-07: 1 mL via EPIDURAL

## 2020-09-07 MED ORDER — METHYLPREDNISOLONE ACETATE 40 MG/ML INJ SUSP (RADIOLOG
120.0000 mg | Freq: Once | INTRAMUSCULAR | Status: AC
  Administered 2020-09-07: 120 mg via EPIDURAL

## 2020-09-07 NOTE — Discharge Instructions (Signed)

## 2020-09-09 DIAGNOSIS — I119 Hypertensive heart disease without heart failure: Secondary | ICD-10-CM | POA: Diagnosis not present

## 2020-09-09 DIAGNOSIS — E785 Hyperlipidemia, unspecified: Secondary | ICD-10-CM | POA: Diagnosis not present

## 2020-09-23 DIAGNOSIS — I119 Hypertensive heart disease without heart failure: Secondary | ICD-10-CM | POA: Diagnosis not present

## 2020-09-28 ENCOUNTER — Other Ambulatory Visit: Payer: Self-pay | Admitting: Internal Medicine

## 2020-09-28 DIAGNOSIS — R7989 Other specified abnormal findings of blood chemistry: Secondary | ICD-10-CM

## 2020-10-06 ENCOUNTER — Ambulatory Visit
Admission: RE | Admit: 2020-10-06 | Discharge: 2020-10-06 | Disposition: A | Discharge status: Home / Self Care | Payer: BC Managed Care – PPO | Source: Ambulatory Visit | Attending: Internal Medicine | Admitting: Internal Medicine

## 2020-10-06 DIAGNOSIS — R7989 Other specified abnormal findings of blood chemistry: Secondary | ICD-10-CM | POA: Diagnosis not present

## 2020-10-18 DIAGNOSIS — G4733 Obstructive sleep apnea (adult) (pediatric): Secondary | ICD-10-CM | POA: Diagnosis not present

## 2021-01-17 DIAGNOSIS — G4733 Obstructive sleep apnea (adult) (pediatric): Secondary | ICD-10-CM | POA: Diagnosis not present

## 2021-01-25 ENCOUNTER — Ambulatory Visit (INDEPENDENT_AMBULATORY_CARE_PROVIDER_SITE_OTHER): Payer: BC Managed Care – PPO

## 2021-01-25 ENCOUNTER — Other Ambulatory Visit: Payer: Self-pay

## 2021-01-25 ENCOUNTER — Other Ambulatory Visit: Payer: Self-pay | Admitting: Podiatry

## 2021-01-25 ENCOUNTER — Encounter: Payer: Self-pay | Admitting: Podiatry

## 2021-01-25 ENCOUNTER — Ambulatory Visit (INDEPENDENT_AMBULATORY_CARE_PROVIDER_SITE_OTHER): Payer: BC Managed Care – PPO | Admitting: Podiatry

## 2021-01-25 VITALS — BP 136/71 | HR 79 | Temp 98.2°F

## 2021-01-25 DIAGNOSIS — M7662 Achilles tendinitis, left leg: Secondary | ICD-10-CM | POA: Diagnosis not present

## 2021-01-25 DIAGNOSIS — M722 Plantar fascial fibromatosis: Secondary | ICD-10-CM

## 2021-01-25 MED ORDER — TRIAMCINOLONE ACETONIDE 10 MG/ML IJ SUSP
20.0000 mg | Freq: Once | INTRAMUSCULAR | Status: AC
  Administered 2021-01-25: 17:00:00 20 mg

## 2021-01-25 MED ORDER — DICLOFENAC SODIUM 75 MG PO TBEC: 75.0000 mg | DELAYED_RELEASE_TABLET | Freq: Two times a day (BID) | ORAL | 2 refills | Status: DC

## 2021-01-25 NOTE — Progress Notes (Signed)
Subjective:   Patient ID: Richard Hull, male   DOB: 45 y.o.   MRN: [id number]   HPI Patient presents with 2 separate problems with 1 being pain in the back of the left heel and the other being pain in the bottom of the right heel.  States they are both sore and has been hurting for several months and states that he tries to stay active and has a job where he is active.  Patient does not smoke   Review of Systems  All other systems reviewed and are negative.       Objective:  Physical Exam Vitals and nursing note reviewed.  Constitutional:      Appearance: He is well-developed.  Pulmonary:     Effort: Pulmonary effort is normal.  Musculoskeletal:        General: Normal range of motion.  Skin:    General: Skin is warm.  Neurological:     Mental Status: He is alert.     Neurovascular status found to be intact muscle strength adequate range of motion adequate with exquisite discomfort posterior aspect left heel at the insertional Achilles lateral side and also discomfort in the plantar heel right at the insertion with patient found to be a hard heel walker.  Patient had mild equinus no muscle strength loss or other pathology.  Good digital perfusion     Assessment:  Acute Planter fasciitis right and Achilles tendinitis left     Plan:  H&P reviewed both conditions and x-rays.  I explained injection and chances for rupture associated with injections and he wants to go this route and for the left I did sterile prep and injected the lateral side of the Achilles 3 mg Dexasone Kenalog 5 mg Xylocaine and for the plantar right heel injected 3 mg Kenalog 5 mg Xylocaine advised on reduced activity placed on oral diclofenac and discussed what to be careful with.  Reappoint 3 weeks or earlier if necessary  X-rays indicate that there is plantar spur formation no indication stress fracture arthritis small spur left heel

## 2021-01-25 NOTE — Patient Instructions (Signed)
Plantar Fasciitis (Heel Spur Syndrome) with Rehab The plantar fascia is a fibrous, ligament-like, soft-tissue structure that spans the bottom of the foot. Plantar fasciitis is a condition that causes pain in the foot due to inflammation of the tissue. SYMPTOMS   Pain and tenderness on the underneath side of the foot.  Pain that worsens with standing or walking. CAUSES  Plantar fasciitis is caused by irritation and injury to the plantar fascia on the underneath side of the foot. Common mechanisms of injury include:  Direct trauma to bottom of the foot.  Damage to a small nerve that runs under the foot where the main fascia attaches to the heel bone.  Stress placed on the plantar fascia due to bone spurs. RISK INCREASES WITH:   Activities that place stress on the plantar fascia (running, jumping, pivoting, or cutting).  Poor strength and flexibility.  Improperly fitted shoes.  Tight calf muscles.  Flat feet.  Failure to warm-up properly before activity.  Obesity. PREVENTION  Warm up and stretch properly before activity.  Allow for adequate recovery between workouts.  Maintain physical fitness:  Strength, flexibility, and endurance.  Cardiovascular fitness.  Maintain a health body weight.  Avoid stress on the plantar fascia.  Wear properly fitted shoes, including arch supports for individuals who have flat feet.  PROGNOSIS  If treated properly, then the symptoms of plantar fasciitis usually resolve without surgery. However, occasionally surgery is necessary.  RELATED COMPLICATIONS   Recurrent symptoms that may result in a chronic condition.  Problems of the lower back that are caused by compensating for the injury, such as limping.  Pain or weakness of the foot during push-off following surgery.  Chronic inflammation, scarring, and partial or complete fascia tear, occurring more often from repeated injections.  TREATMENT  Treatment initially involves the  use of ice and medication to help reduce pain and inflammation. The use of strengthening and stretching exercises may help reduce pain with activity, especially stretches of the Achilles tendon. These exercises may be performed at home or with a therapist. Your caregiver may recommend that you use heel cups of arch supports to help reduce stress on the plantar fascia. Occasionally, corticosteroid injections are given to reduce inflammation. If symptoms persist for greater than 6 months despite non-surgical (conservative), then surgery may be recommended.   MEDICATION   If pain medication is necessary, then nonsteroidal anti-inflammatory medications, such as aspirin and ibuprofen, or other minor pain relievers, such as acetaminophen, are often recommended.  Do not take pain medication within 7 days before surgery.  Prescription pain relievers may be given if deemed necessary by your caregiver. Use only as directed and only as much as you need.  Corticosteroid injections may be given by your caregiver. These injections should be reserved for the most serious cases, because they may only be given a certain number of times.  HEAT AND COLD  Cold treatment (icing) relieves pain and reduces inflammation. Cold treatment should be applied for 10 to 15 minutes every 2 to 3 hours for inflammation and pain and immediately after any activity that aggravates your symptoms. Use ice packs or massage the area with a piece of ice (ice massage).  Heat treatment may be used prior to performing the stretching and strengthening activities prescribed by your caregiver, physical therapist, or athletic trainer. Use a heat pack or soak the injury in warm water.  SEEK IMMEDIATE MEDICAL CARE IF:  Treatment seems to offer no benefit, or the condition worsens.  Any medications   produce adverse side effects.  EXERCISES- RANGE OF MOTION (ROM) AND STRETCHING EXERCISES - Plantar Fasciitis (Heel Spur Syndrome) These exercises  may help you when beginning to rehabilitate your injury. Your symptoms may resolve with or without further involvement from your physician, physical therapist or athletic trainer. While completing these exercises, remember:   Restoring tissue flexibility helps normal motion to return to the joints. This allows healthier, less painful movement and activity.  An effective stretch should be held for at least 30 seconds.  A stretch should never be painful. You should only feel a gentle lengthening or release in the stretched tissue.  RANGE OF MOTION - Toe Extension, Flexion  Sit with your right / left leg crossed over your opposite knee.  Grasp your toes and gently pull them back toward the top of your foot. You should feel a stretch on the bottom of your toes and/or foot.  Hold this stretch for 10 seconds.  Now, gently pull your toes toward the bottom of your foot. You should feel a stretch on the top of your toes and or foot.  Hold this stretch for 10 seconds. Repeat  times. Complete this stretch 3 times per day.   RANGE OF MOTION - Ankle Dorsiflexion, Active Assisted  Remove shoes and sit on a chair that is preferably not on a carpeted surface.  Place right / left foot under knee. Extend your opposite leg for support.  Keeping your heel down, slide your right / left foot back toward the chair until you feel a stretch at your ankle or calf. If you do not feel a stretch, slide your bottom forward to the edge of the chair, while still keeping your heel down.  Hold this stretch for 10 seconds. Repeat 3 times. Complete this stretch 2 times per day.   STRETCH  Gastroc, Standing  Place hands on wall.  Extend right / left leg, keeping the front knee somewhat bent.  Slightly point your toes inward on your back foot.  Keeping your right / left heel on the floor and your knee straight, shift your weight toward the wall, not allowing your back to arch.  You should feel a gentle stretch  in the right / left calf. Hold this position for 10 seconds. Repeat 3 times. Complete this stretch 2 times per day.  STRETCH  Soleus, Standing  Place hands on wall.  Extend right / left leg, keeping the other knee somewhat bent.  Slightly point your toes inward on your back foot.  Keep your right / left heel on the floor, bend your back knee, and slightly shift your weight over the back leg so that you feel a gentle stretch deep in your back calf.  Hold this position for 10 seconds. Repeat 3 times. Complete this stretch 2 times per day.  STRETCH  Gastrocsoleus, Standing  Note: This exercise can place a lot of stress on your foot and ankle. Please complete this exercise only if specifically instructed by your caregiver.   Place the ball of your right / left foot on a step, keeping your other foot firmly on the same step.  Hold on to the wall or a rail for balance.  Slowly lift your other foot, allowing your body weight to press your heel down over the edge of the step.  You should feel a stretch in your right / left calf.  Hold this position for 10 seconds.  Repeat this exercise with a slight bend in your right /   left knee. Repeat 3 times. Complete this stretch 2 times per day.   STRENGTHENING EXERCISES - Plantar Fasciitis (Heel Spur Syndrome)  These exercises may help you when beginning to rehabilitate your injury. They may resolve your symptoms with or without further involvement from your physician, physical therapist or athletic trainer. While completing these exercises, remember:   Muscles can gain both the endurance and the strength needed for everyday activities through controlled exercises.  Complete these exercises as instructed by your physician, physical therapist or athletic trainer. Progress the resistance and repetitions only as guided.  STRENGTH - Towel Curls  Sit in a chair positioned on a non-carpeted surface.  Place your foot on a towel, keeping your heel  on the floor.  Pull the towel toward your heel by only curling your toes. Keep your heel on the floor. Repeat 3 times. Complete this exercise 2 times per day.  STRENGTH - Ankle Inversion  Secure one end of a rubber exercise band/tubing to a fixed object (table, pole). Loop the other end around your foot just before your toes.  Place your fists between your knees. This will focus your strengthening at your ankle.  Slowly, pull your big toe up and in, making sure the band/tubing is positioned to resist the entire motion.  Hold this position for 10 seconds.  Have your muscles resist the band/tubing as it slowly pulls your foot back to the starting position. Repeat 3 times. Complete this exercises 2 times per day.  Document Released: 08/06/2005 Document Revised: 10/29/2011 Document Reviewed: 11/18/2008 [facility] Patient Information 2014 [place], [place]. Rosen's Emergency Medicine: Concepts and Clinical Practice (9th ed., pp. 1607-3710). [place], [place]: [employer]. Retrieved from https://www.clinicalkey.com/#!/content/book/3-s2.0-[email or web address]?scrollTo=%23hl0000251">  Achilles Tendinitis  Achilles tendinitis is inflammation of the tough, cord-like band that attaches the lower leg muscles to the heel bone (Achilles tendon). This is usually caused by overusing the tendon and the ankle joint. Achilles tendinitis usually gets better over time with treatment and caring for yourself at home. It can take weeks or months to heal completely. What are the causes? This condition may be caused by:  A sudden increase in exercise or activity, such as running.  Doing the same exercises or activities, such as jumping, over and over.  Not warming up calf muscles before exercising.  Exercising in shoes that are worn out or not made for exercise.  Having arthritis or a bone growth (spur) on the back of the heel bone. This can rub against the tendon and hurt it.  Age-related wear and tear.  Tendons become less flexible with age and are more likely to be injured. What are the signs or symptoms? Common symptoms of this condition include:  Pain in the Achilles tendon or in the back of the leg, just above the heel. The pain usually gets worse with exercise.  Stiffness or soreness in the back of the leg, especially in the morning.  Swelling of the skin over the Achilles tendon.  Thickening of the tendon.  Trouble standing on tiptoe. How is this diagnosed? This condition is diagnosed based on your symptoms and a physical exam. You may have tests, including:  X-rays.  MRI. How is this treated? The goal of treatment is to relieve symptoms and help your injury heal. Treatment may include:  Decreasing or stopping activities that caused the tendinitis. This may mean switching to low-impact exercises like biking or swimming.  Icing the injured area.  Doing physical therapy, including strengthening and stretching exercises.  Taking NSAIDs, such  as ibuprofen, to help relieve pain and swelling.  Using supportive shoes, wraps, heel lifts, or a walking boot (air cast).  Having surgery. This may be done if your symptoms do not improve after other treatments.  Using high-energy shock wave impulses to stimulate the healing process (extracorporeal shock wave therapy). This is rare.  Having an injection of medicines that help relieve inflammation (corticosteroids). This is rare. Follow these instructions at home: If you have an air cast:  Wear the air cast as told by your health care provider. Remove it only as told by your health care provider.  Loosen it if your toes tingle, become numb, or turn cold and blue.  Keep it clean.  If the air cast is not waterproof: ? Do not let it get wet. ? Cover it with a watertight covering when you take a bath or shower. Managing pain, stiffness, and swelling  If directed, put ice on the injured area. To do this: ? If you have a  removable air cast, remove it as told by your health care provider. ? Put ice in a plastic bag. ? Place a towel between your skin and the bag. ? Leave the ice on for 20 minutes, 2-3 times a day.  Move your toes often to reduce stiffness and swelling.  Raise (elevate) your foot above the level of your heart while you are sitting or lying down.   Activity  Gradually return to your normal activities as told by your health care provider. Ask your health care provider what activities are safe for you.  Do not do activities that cause pain.  Consider doing low-impact exercises, like cycling or swimming.  Ask your health care provider when it is safe to drive if you have an air cast on your foot.  If physical therapy was prescribed, do exercises as told by your health care provider or physical therapist. General instructions  If directed, wrap your foot with an elastic bandage or other wrap. This can help to keep your tendon from moving too much while it heals. Your health care provider will show you how to wrap your foot correctly.  Wear supportive shoes or heel lifts only as told by your health care provider.  Take over-the-counter and prescription medicines only as told by your health care provider.  Keep all follow-up visits as told by your health care provider. This is important. Contact a health care provider if you:  Have symptoms that get worse.  Have pain that does not get better with medicine.  Develop new, unexplained symptoms.  Develop warmth and swelling in your foot.  Have a fever. Get help right away if you:  Have a sudden popping sound or sensation in your Achilles tendon followed by severe pain.  Cannot move your toes or foot.  Cannot put any weight on your foot.  Your foot or toes become numb and look white or blue even after loosening your bandage or air cast. Summary  Achilles tendinitis is inflammation of the tough, cord-like band that attaches the  lower leg muscles to the heel bone (Achilles tendon).  This condition is usually caused by overusing the tendon and the ankle joint. It can also be caused by arthritis or normal aging.  The most common symptoms of this condition include pain, swelling, or stiffness in the Achilles tendon or in the back of the leg.  This condition is usually treated by decreasing or stopping activities that caused the tendinitis, icing the injured area, taking  NSAIDs, and doing physical therapy. This information is not intended to replace advice given to you by your health care provider. Make sure you discuss any questions you have with your health care provider. Document Revised: 12/22/2018 Document Reviewed: 12/22/2018 Elsevier Patient Education  2021 [employer].

## 2021-02-15 ENCOUNTER — Encounter: Payer: Self-pay | Admitting: Podiatry

## 2021-02-15 ENCOUNTER — Other Ambulatory Visit: Payer: Self-pay

## 2021-02-15 ENCOUNTER — Ambulatory Visit (INDEPENDENT_AMBULATORY_CARE_PROVIDER_SITE_OTHER): Payer: BC Managed Care – PPO | Admitting: Podiatry

## 2021-02-15 DIAGNOSIS — M722 Plantar fascial fibromatosis: Secondary | ICD-10-CM

## 2021-02-15 DIAGNOSIS — M7662 Achilles tendinitis, left leg: Secondary | ICD-10-CM

## 2021-02-15 NOTE — Progress Notes (Signed)
Subjective:   Patient ID: Richard Hull, male   DOB: 45 y.o.   MRN: [id number]   HPI Patient states he is improved on both feet stating the right foot is still bothering him some with the left being almost completely improved   ROS      Objective:  Physical Exam  Neurovascular status intact with patient's left Achilles at insertion doing much better with no discomfort and mild to moderate discomfort in the plantar fascial right still noted with inflammation around the medial band of the plantar fascia at insertion.  Patient is found to have good digital perfusion well oriented x3     Assessment:  Doing much better post Achilles tendinitis left Planter fasciitis right with moderate pain still noted right     Plan:  H&P reviewed both conditions with patient.  I do think consideration is there for further treatment but at this point we will get a try conservative already to utilize anti-inflammatories physical therapy and support along with shoe gear modifications and heel lift left.  Patient is encouraged to call questions concerns which may arise and is to be rescheduled as symptoms indicate in the future

## 2021-03-16 DIAGNOSIS — Z125 Encounter for screening for malignant neoplasm of prostate: Secondary | ICD-10-CM | POA: Diagnosis not present

## 2021-03-16 DIAGNOSIS — E785 Hyperlipidemia, unspecified: Secondary | ICD-10-CM | POA: Diagnosis not present

## 2021-03-16 DIAGNOSIS — R739 Hyperglycemia, unspecified: Secondary | ICD-10-CM | POA: Diagnosis not present

## 2021-03-23 DIAGNOSIS — R82998 Other abnormal findings in urine: Secondary | ICD-10-CM | POA: Diagnosis not present

## 2021-03-23 DIAGNOSIS — I119 Hypertensive heart disease without heart failure: Secondary | ICD-10-CM | POA: Diagnosis not present

## 2021-03-23 DIAGNOSIS — Z0000 Encounter for general adult medical examination without abnormal findings: Secondary | ICD-10-CM | POA: Diagnosis not present

## 2021-04-17 DIAGNOSIS — G4733 Obstructive sleep apnea (adult) (pediatric): Secondary | ICD-10-CM | POA: Diagnosis not present

## 2021-05-05 IMAGING — CR DG LUMBAR SPINE 2-3V
3 series · 3 of 3 positions shown · non-contrast
Comparison: CT abdomen pelvis 03/08/2014

CLINICAL DATA: Chronic left lower back pain

EXAM:
LUMBAR SPINE - 2-3 VIEW

[w lumbar spine ap]
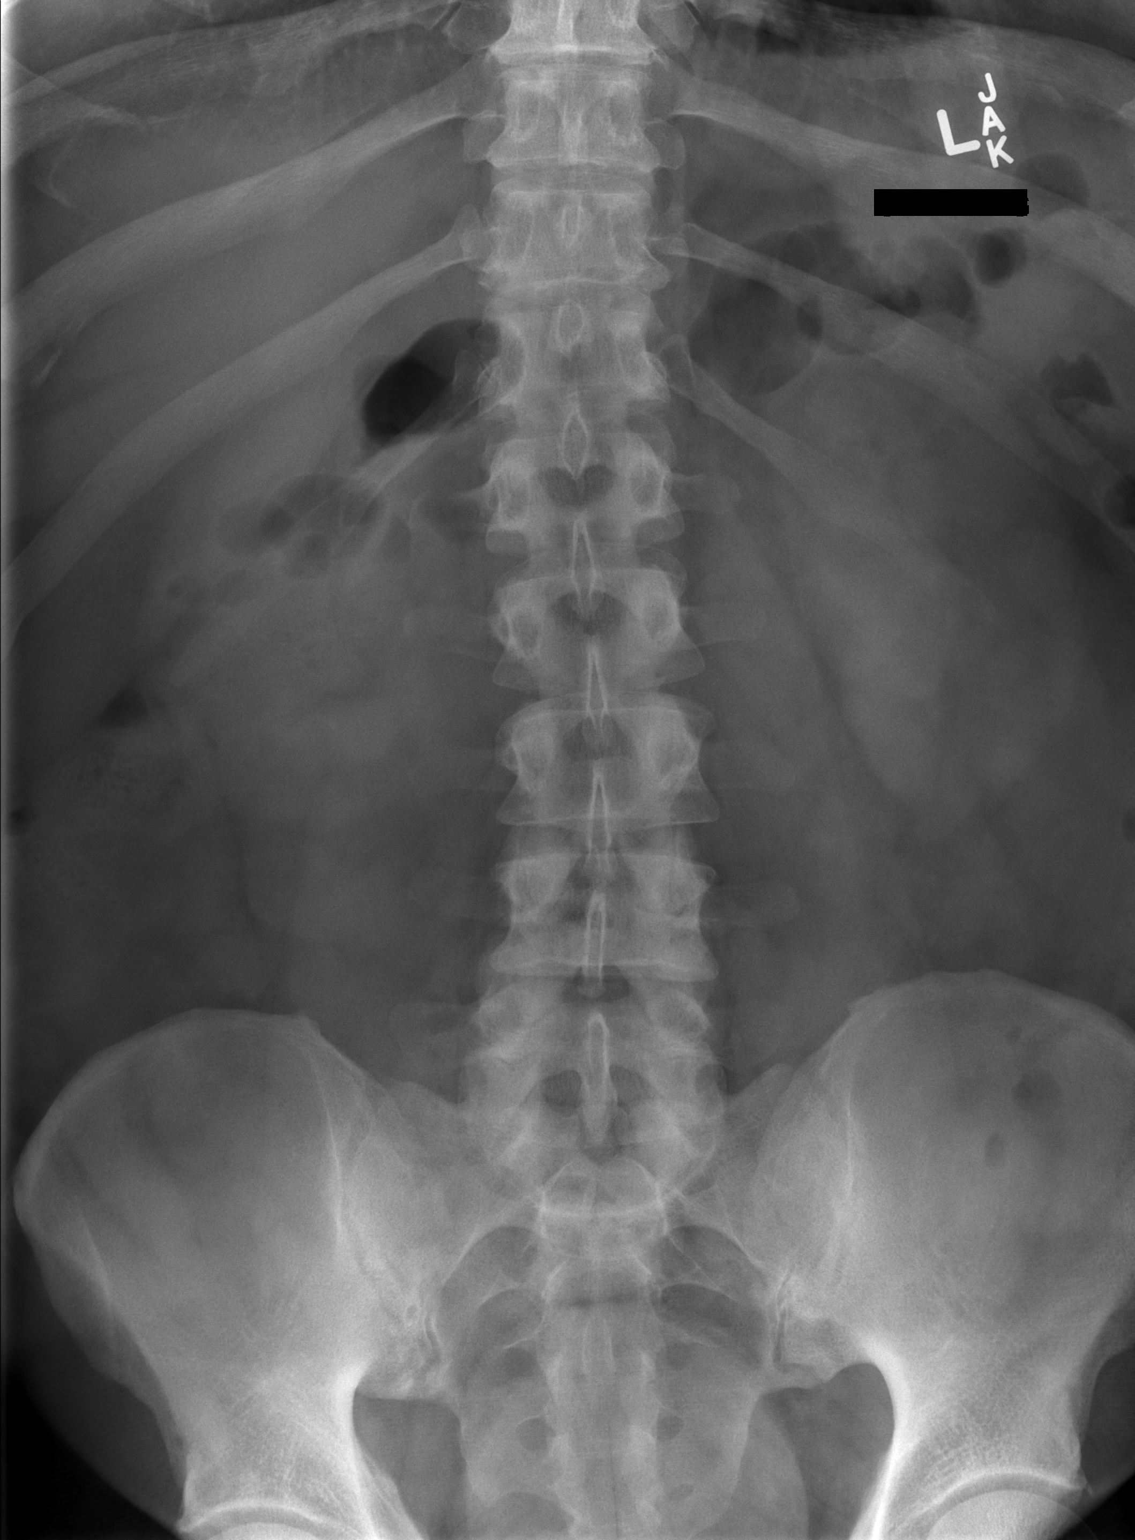

[w lumbar spine lat]
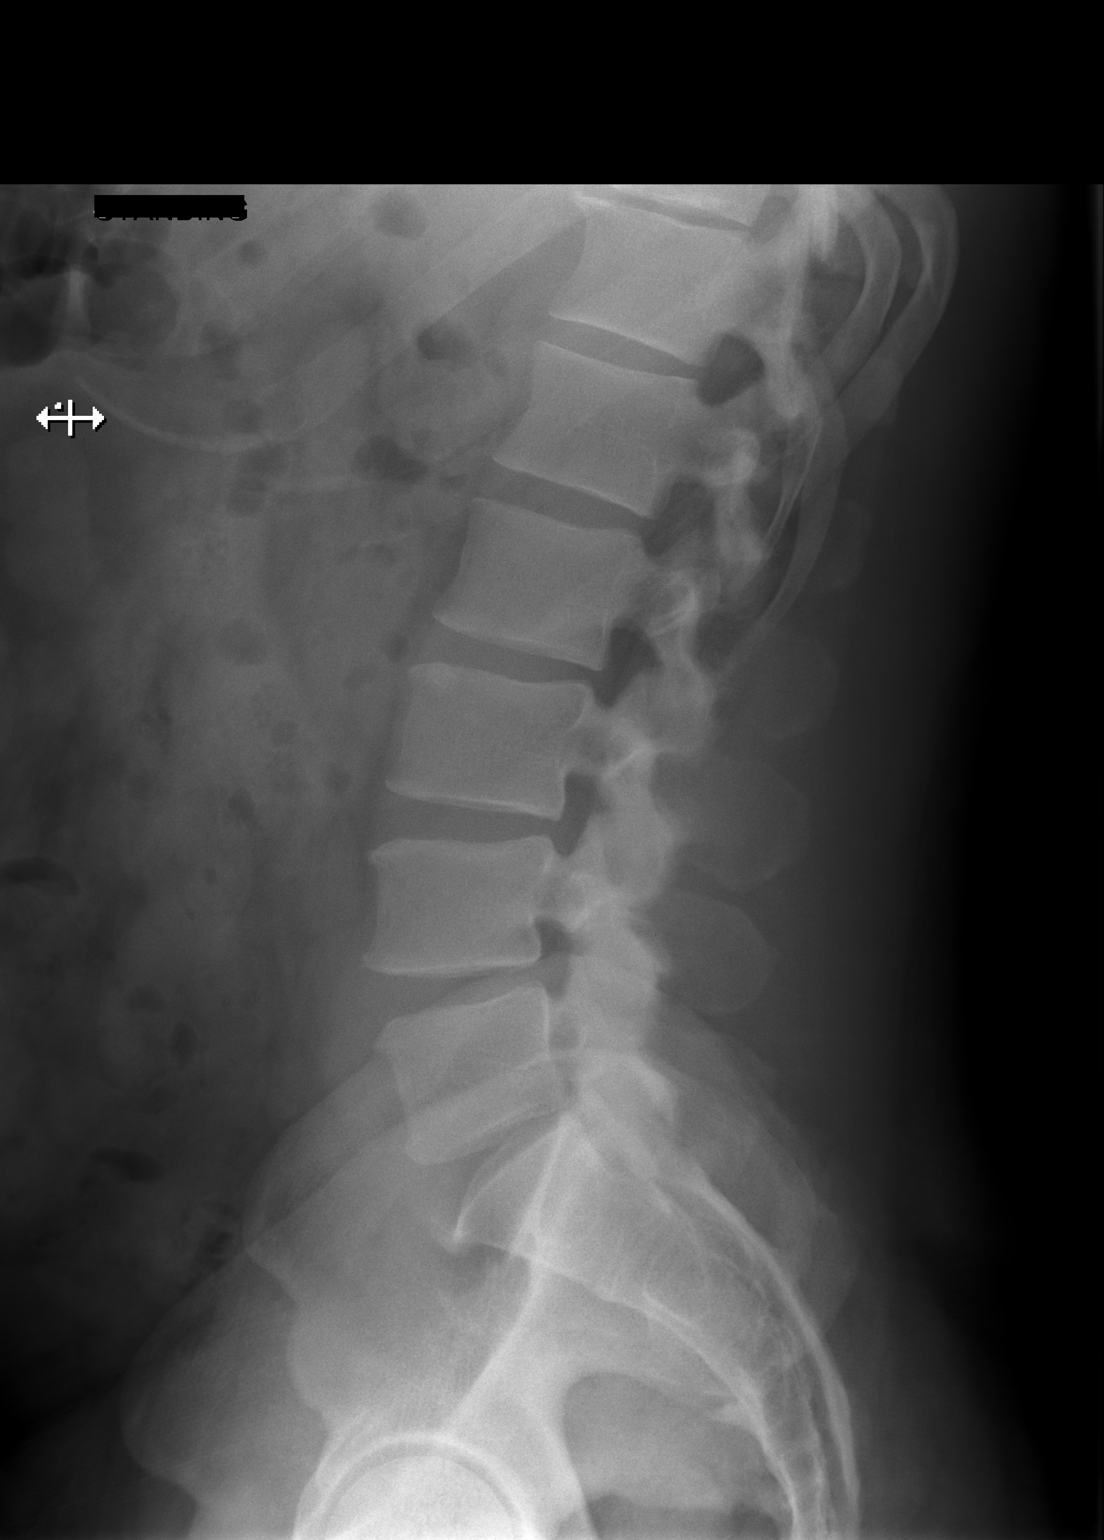

[w lumbar l-5 s-1 spot]
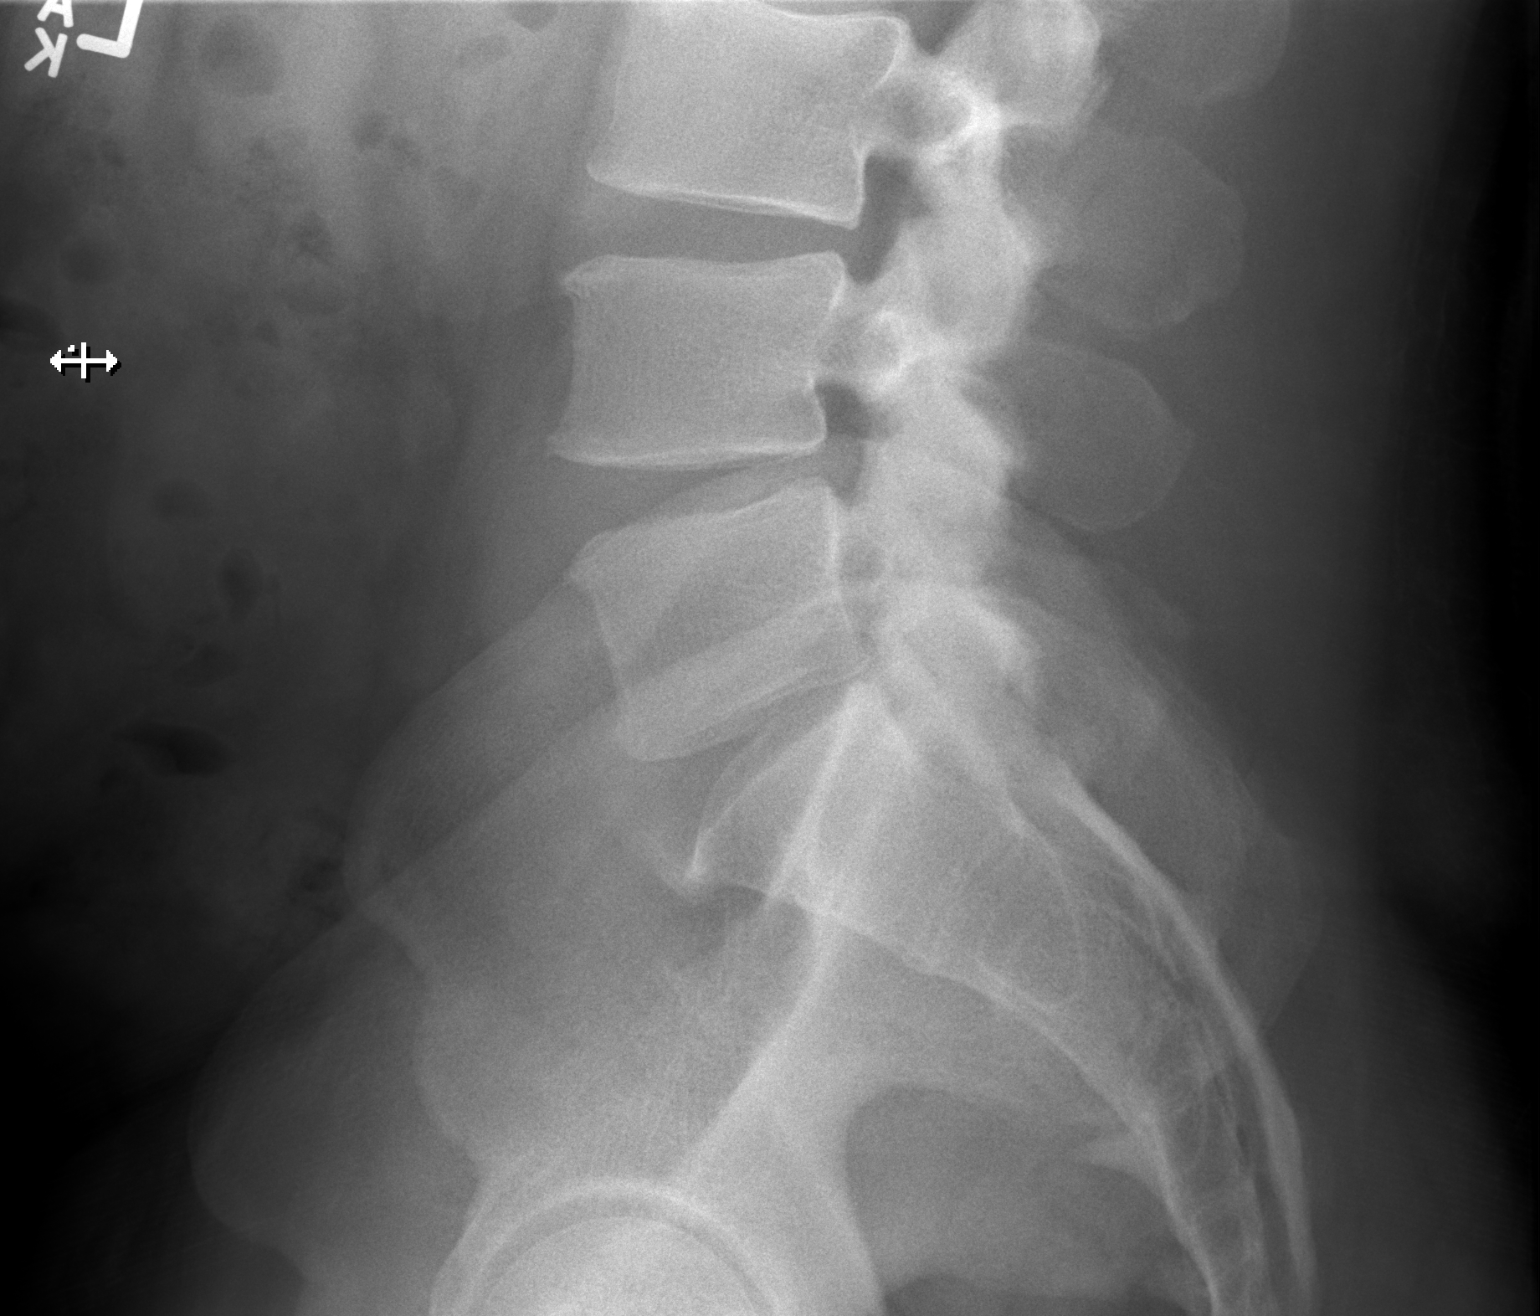

[3 of 3 positions shown; findings below may reference images not displayed]

FINDINGS: Five lumbar type vertebral bodies. Preservation of the normal lumbar
lordosis. No significant listhesis. Minimal intervertebral disc
height loss and discogenic endplate changes as well as posterior
facet hypertrophic changes maximal L4-S1. No acute fracture or
vertebral body height loss. No suspicious osseous lesions. Minimal
degenerative changes at the SI joints similar to comparison.
IMPRESSION: 1. No acute osseous abnormality of the lumbar spine.
2. Diffuse mild discogenic and facet degenerative changes, maximal
L4-S1.

## 2021-05-10 DIAGNOSIS — R7301 Impaired fasting glucose: Secondary | ICD-10-CM | POA: Diagnosis not present

## 2021-06-06 ENCOUNTER — Other Ambulatory Visit (HOSPITAL_COMMUNITY): Payer: Self-pay | Admitting: *Deleted

## 2021-06-07 ENCOUNTER — Other Ambulatory Visit: Payer: Self-pay

## 2021-06-07 ENCOUNTER — Ambulatory Visit (HOSPITAL_COMMUNITY)
Admission: RE | Admit: 2021-06-07 | Discharge: 2021-06-07 | Disposition: A | Discharge status: Home / Self Care | Payer: BC Managed Care – PPO | Source: Ambulatory Visit | Attending: Internal Medicine | Admitting: Internal Medicine

## 2021-06-07 DIAGNOSIS — E781 Pure hyperglyceridemia: Secondary | ICD-10-CM | POA: Insufficient documentation

## 2021-06-07 DIAGNOSIS — I2510 Atherosclerotic heart disease of native coronary artery without angina pectoris: Secondary | ICD-10-CM | POA: Diagnosis not present

## 2021-06-07 DIAGNOSIS — E785 Hyperlipidemia, unspecified: Secondary | ICD-10-CM | POA: Insufficient documentation

## 2021-06-07 MED ORDER — INCLISIRAN SODIUM 284 MG/1.5ML ~~LOC~~ SOSY
284.0000 mg | PREFILLED_SYRINGE | Freq: Once | SUBCUTANEOUS | Status: DC
  Administered 2021-06-07: 284 mg via SUBCUTANEOUS

## 2021-06-07 MED ORDER — INCLISIRAN SODIUM 284 MG/1.5ML ~~LOC~~ SOSY
PREFILLED_SYRINGE | SUBCUTANEOUS | Status: AC
  Filled 2021-06-07: qty 1.5

## 2021-06-22 DIAGNOSIS — R7301 Impaired fasting glucose: Secondary | ICD-10-CM | POA: Diagnosis not present

## 2021-06-24 ENCOUNTER — Ambulatory Visit
Admission: EM | Admit: 2021-06-24 | Discharge: 2021-06-24 | Disposition: A | Discharge status: Home / Self Care | Payer: BC Managed Care – PPO | Attending: Family Medicine | Admitting: Family Medicine

## 2021-06-24 ENCOUNTER — Other Ambulatory Visit: Payer: Self-pay

## 2021-06-24 ENCOUNTER — Encounter: Payer: Self-pay | Admitting: Emergency Medicine

## 2021-06-24 DIAGNOSIS — M7662 Achilles tendinitis, left leg: Secondary | ICD-10-CM

## 2021-06-24 NOTE — ED Triage Notes (Signed)
Pt is present today with left foot pain and swelling. Pt states that he noticed the pain yesterday. Pt denies any injury

## 2021-06-26 NOTE — ED Provider Notes (Signed)
[facility]   [id number] 06/24/21 Arrival Time: 0808  ASSESSMENT & PLAN:  1. Achilles tendinitis of left lower extremity    Placed in CAM walker. WBAT. NSAID next 1 week. Has orthopaedist to f/u with. Work note provided.  Orders Placed This Encounter  Procedures   Apply CAM boot    Recommend:  Follow-up Information     [name], MD.   Specialty: Internal Medicine Why: If worsening or failing to improve as anticipated. Contact information: [address] [place] [address] [phone]                 Reviewed expectations re: course of current medical issues. Questions answered. Outlined signs and symptoms indicating need for more acute intervention. Patient verbalized understanding. After Visit Summary given.  SUBJECTIVE: History from: patient. Richard Hull is a 45 y.o. male who reports L heel pain; abrupt onset; yest; no injury; h/o achilles tendonitis on R. Able to br wt but with pain. No swelling. No extremity sensation changes or weakness. NSAID with some relief.  Past Surgical History:  Procedure Laterality Date   ANTERIOR CERVICAL DECOMP/DISCECTOMY FUSION N/A 05/01/2017   Procedure: Anterior Cervical Decompression Fusion Cervical Four-Five Cervical Five-Six;  Surgeon: Shirlean Kelly, MD;  Location: [facility] OR;  Service: Neurosurgery;  Laterality: N/A;  Anterior Cervical Decompression Fusion Cervical Four-Five Cervical Five-Six   CARDIAC CATHETERIZATION N/A 09/15/2015   Procedure: Left Heart Cath and Coronary Angiography;  Surgeon: Lennette Bihari, MD; LAD 30%, pRCA 30%, dRCA 30%, RPDA 99%, nl LV function    CARDIAC CATHETERIZATION N/A 09/15/2015   Procedure: Coronary Stent Intervention;  Surgeon: Lennette Bihari, MD; 3.515 mm Xience Alpine DES to dRCA/RPDA>> 5% residual      LEFT HEART CATH AND CORONARY ANGIOGRAPHY N/A 11/28/2016   Procedure: Left Heart Cath and Coronary Angiography;  Surgeon: Lennette Bihari, MD;  Location: [facility];  Service: Cardiovascular;  Laterality: N/A;   TONSILLECTOMY        OBJECTIVE:  Vitals:   06/24/21 0813 06/24/21 0814  BP:  (!) 148/103  Pulse:  (!) 104  Resp:  18  Temp: 98 F (36.7 C)   SpO2:  98%    General appearance: alert; no distress HEENT: [place]; AT Neck: supple with FROM Resp: unlabored respirations Extremities: LLE: warm with well perfused appearance; very TTP over L Achilles heel insertion; no overlying erythema CV: brisk extremity capillary refill of LLE; 2+ DP of LLE. Skin: warm and dry; no visible rashes Neurologic: normal sensation and strength of LLE Psychological: alert and cooperative; normal mood and affect    Allergies  Allergen Reactions   Amoxicillin Rash   Penicillins Rash and Other (See Comments)    Has patient had a PCN reaction causing immediate rash, facial/tongue/throat swelling, SOB or lightheadedness with hypotension: Yes Has patient had a PCN reaction causing severe rash involving mucus membranes or skin necrosis: No Has patient had a PCN reaction that required hospitalization No Has patient had a PCN reaction occurring within the last 10 years: No If all of the above answers are "NO", then may proceed with Cephalosporin use.     Past Medical History:  Diagnosis Date   Cervical radiculopathy at C5 01/27/2016   left   Coronary artery disease 08/2015   Depression    GERD (gastroesophageal reflux disease)    History of kidney stones    Hyperlipidemia LDL goal <70    Hypertension    Hypertriglyceridemia    Left arm  weakness    Myocardial infarction (HCC) 08/2015   OCD (obsessive compulsive disorder)    Sleep apnea    Social History   Socioeconomic History   Marital status: Legally Separated    Spouse name: Not on file   Number of children: 3   Years of education: Some Coll   Highest education level: Not on file  Occupational History   Occupation: Occupational psychologist: [employer]  Tobacco Use   Smoking status:  Never   Smokeless tobacco: Never  Vaping Use   Vaping Use: Never used  Substance and Sexual Activity   Alcohol use: Yes    Alcohol/week: 0.0 standard drinks    Comment: Rarely   Drug use: No   Sexual activity: Yes  Other Topics Concern   Not on file  Social History Narrative   Multiple family members on his father's side have/had CAD at a young age, uncles and grandparents.   Lives at home with his wife and three children.   Right-handed.   2-3 cups caffeine per week.   Social Determinants of Health   Financial Resource Strain: Not on file  Food Insecurity: Not on file  Transportation Needs: Not on file  Physical Activity: Not on file  Stress: Not on file  Social Connections: Not on file   Family History  Problem Relation Age of Onset   Heart attack Father 44       approx age of first stent   Stroke Father    Hypertension Father    Skin cancer Mother    Hyperlipidemia Sister    Stroke Paternal Grandmother    Hypertension Paternal Grandmother    Hypertension Maternal Grandmother    Hypertension Maternal Grandfather    Diabetes Maternal Grandfather    Hypertension Paternal Grandfather    Diabetes Paternal Grandfather    Heart disease Paternal Grandfather    Past Surgical History:  Procedure Laterality Date   ANTERIOR CERVICAL DECOMP/DISCECTOMY FUSION N/A 05/01/2017   Procedure: Anterior Cervical Decompression Fusion Cervical Four-Five Cervical Five-Six;  Surgeon: Shirlean Kelly, MD;  Location: [facility] OR;  Service: Neurosurgery;  Laterality: N/A;  Anterior Cervical Decompression Fusion Cervical Four-Five Cervical Five-Six   CARDIAC CATHETERIZATION N/A 09/15/2015   Procedure: Left Heart Cath and Coronary Angiography;  Surgeon: Lennette Bihari, MD; LAD 30%, pRCA 30%, dRCA 30%, RPDA 99%, nl LV function    CARDIAC CATHETERIZATION N/A 09/15/2015   Procedure: Coronary Stent Intervention;  Surgeon: Lennette Bihari, MD; 3.515 mm Xience Alpine DES to dRCA/RPDA>> 5% residual       LEFT HEART CATH AND CORONARY ANGIOGRAPHY N/A 11/28/2016   Procedure: Left Heart Cath and Coronary Angiography;  Surgeon: Lennette Bihari, MD;  Location: [facility];  Service: Cardiovascular;  Laterality: N/A;   Greig Right, MD 06/26/21 [phone]

## 2021-07-05 ENCOUNTER — Other Ambulatory Visit: Payer: Self-pay | Admitting: Podiatry

## 2021-07-17 DIAGNOSIS — G4733 Obstructive sleep apnea (adult) (pediatric): Secondary | ICD-10-CM | POA: Diagnosis not present

## 2021-07-18 DIAGNOSIS — G4733 Obstructive sleep apnea (adult) (pediatric): Secondary | ICD-10-CM | POA: Diagnosis not present

## 2021-08-02 DIAGNOSIS — D2261 Melanocytic nevi of right upper limb, including shoulder: Secondary | ICD-10-CM | POA: Diagnosis not present

## 2021-08-02 DIAGNOSIS — L812 Freckles: Secondary | ICD-10-CM | POA: Diagnosis not present

## 2021-08-02 DIAGNOSIS — D225 Melanocytic nevi of trunk: Secondary | ICD-10-CM | POA: Diagnosis not present

## 2021-08-02 DIAGNOSIS — D2262 Melanocytic nevi of left upper limb, including shoulder: Secondary | ICD-10-CM | POA: Diagnosis not present

## 2021-08-15 DIAGNOSIS — I119 Hypertensive heart disease without heart failure: Secondary | ICD-10-CM | POA: Diagnosis not present

## 2021-08-16 DIAGNOSIS — G4733 Obstructive sleep apnea (adult) (pediatric): Secondary | ICD-10-CM | POA: Diagnosis not present

## 2021-08-17 DIAGNOSIS — G4733 Obstructive sleep apnea (adult) (pediatric): Secondary | ICD-10-CM | POA: Diagnosis not present

## 2021-09-06 DIAGNOSIS — F429 Obsessive-compulsive disorder, unspecified: Secondary | ICD-10-CM | POA: Diagnosis not present

## 2021-09-07 ENCOUNTER — Other Ambulatory Visit (HOSPITAL_COMMUNITY): Payer: Self-pay

## 2021-09-08 ENCOUNTER — Ambulatory Visit (HOSPITAL_COMMUNITY)
Admission: RE | Admit: 2021-09-08 | Discharge: 2021-09-08 | Disposition: A | Discharge status: Home / Self Care | Payer: BC Managed Care – PPO | Source: Ambulatory Visit | Attending: Internal Medicine | Admitting: Internal Medicine

## 2021-09-08 ENCOUNTER — Other Ambulatory Visit: Payer: Self-pay

## 2021-09-08 DIAGNOSIS — E781 Pure hyperglyceridemia: Secondary | ICD-10-CM | POA: Insufficient documentation

## 2021-09-08 DIAGNOSIS — I2510 Atherosclerotic heart disease of native coronary artery without angina pectoris: Secondary | ICD-10-CM | POA: Diagnosis not present

## 2021-09-08 DIAGNOSIS — E785 Hyperlipidemia, unspecified: Secondary | ICD-10-CM | POA: Diagnosis not present

## 2021-09-08 MED ORDER — INCLISIRAN SODIUM 284 MG/1.5ML ~~LOC~~ SOSY: 284.0000 mg | PREFILLED_SYRINGE | Freq: Once | SUBCUTANEOUS | Status: AC

## 2021-09-08 MED ORDER — INCLISIRAN SODIUM 284 MG/1.5ML ~~LOC~~ SOSY
PREFILLED_SYRINGE | SUBCUTANEOUS | Status: AC
  Administered 2021-09-08: 284 mg via SUBCUTANEOUS
  Filled 2021-09-08: qty 1.5

## 2021-09-16 DIAGNOSIS — G4733 Obstructive sleep apnea (adult) (pediatric): Secondary | ICD-10-CM | POA: Diagnosis not present

## 2021-09-17 DIAGNOSIS — G4733 Obstructive sleep apnea (adult) (pediatric): Secondary | ICD-10-CM | POA: Diagnosis not present

## 2021-09-28 DIAGNOSIS — E785 Hyperlipidemia, unspecified: Secondary | ICD-10-CM | POA: Diagnosis not present

## 2021-09-28 DIAGNOSIS — I119 Hypertensive heart disease without heart failure: Secondary | ICD-10-CM | POA: Diagnosis not present

## 2021-09-28 DIAGNOSIS — R7301 Impaired fasting glucose: Secondary | ICD-10-CM | POA: Diagnosis not present

## 2021-10-24 DIAGNOSIS — G4733 Obstructive sleep apnea (adult) (pediatric): Secondary | ICD-10-CM | POA: Diagnosis not present

## 2021-11-10 DIAGNOSIS — F429 Obsessive-compulsive disorder, unspecified: Secondary | ICD-10-CM | POA: Diagnosis not present

## 2021-11-24 DIAGNOSIS — G4733 Obstructive sleep apnea (adult) (pediatric): Secondary | ICD-10-CM | POA: Diagnosis not present

## 2021-12-24 DIAGNOSIS — G4733 Obstructive sleep apnea (adult) (pediatric): Secondary | ICD-10-CM | POA: Diagnosis not present

## 2022-01-08 ENCOUNTER — Encounter: Payer: Self-pay | Admitting: Cardiovascular Disease

## 2022-01-08 ENCOUNTER — Ambulatory Visit (INDEPENDENT_AMBULATORY_CARE_PROVIDER_SITE_OTHER): Payer: BC Managed Care – PPO | Admitting: Cardiovascular Disease

## 2022-01-08 DIAGNOSIS — I10 Essential (primary) hypertension: Secondary | ICD-10-CM | POA: Diagnosis not present

## 2022-01-08 DIAGNOSIS — E785 Hyperlipidemia, unspecified: Secondary | ICD-10-CM | POA: Diagnosis not present

## 2022-01-08 DIAGNOSIS — R7989 Other specified abnormal findings of blood chemistry: Secondary | ICD-10-CM

## 2022-01-08 DIAGNOSIS — I252 Old myocardial infarction: Secondary | ICD-10-CM

## 2022-01-08 DIAGNOSIS — G4733 Obstructive sleep apnea (adult) (pediatric): Secondary | ICD-10-CM

## 2022-01-08 DIAGNOSIS — E669 Obesity, unspecified: Secondary | ICD-10-CM

## 2022-01-08 DIAGNOSIS — Z79899 Other long term (current) drug therapy: Secondary | ICD-10-CM

## 2022-01-08 MED ORDER — METOPROLOL SUCCINATE ER 50 MG PO TB24: 50.0000 mg | ORAL_TABLET | Freq: Every day | ORAL | 3 refills | Status: DC

## 2022-01-08 NOTE — Progress Notes (Signed)
Patient ID: Richard Hull, male   DOB: April 15, 1976, 46 y.o.   MRN: [id number]    Primary M.D.: Dr. Shon Baton  HPI: Richard Hull is a 46 y.o. male who presents to the office today for an 79 month follow up cardiology/sleep evaluation.  Richard Hull has a history of hyperlipidemia as well as hypertension.  On the morning of 09/15/2015 he was awakened from sleep with severe substernal chest pressure.  He presented to [facility] emergency room where his ECG showed sinus tachycardia with inferior ST segment elevation.  A code STEMI was activated.  He underwent emergent cardiac catheterization by me, which revealed an ulcerated plaque in a large large dominant RCA with distal embolization to the PDA.  Intervention was performed and he ultimately underwent stenting of his RCA proximal to the PDA with insertion of a 3.515 mm Xience Alpine DES stent postdilated 3.75 mm and PTCA of the subtotal mid PDA, which was small caliber and dilated with a 20 balloon.  He was found to have mild concomitant CAD with 30% narrowing in the mid to distal LAD and proximal 30% stenosis.  A subsequent echo done on 09/18/2015 showed an EF of 65-70% without wall motion abnormality.  There was grade 1 diastolic dysfunction.  He was seen for post hospital initial evaluation with Richard Hull.  He had developed a dry cough, which was felt to be due to lisinopril, but this ultimately resolved and he has been maintained on lisinopril 40 mg daily, metoprolol 50 mg twice a day, atorvastatin 80 mg with Zetia 10 mg.  He continues to be on dual antiplatelet therapy with aspirin and Brilinta.  He has been taking over-the-counter fish oil.  He has a history of markedly triglycerides and was as high as 1200 several years ago.  When I saw him I was concerned about obstructive sleep apnea in the etiology of his early a.m. MI which awakened him from sleep, suggesting the potential for oxygen desaturation during grams sleep.  I referred  him for a sleep study which confirmed my suspicion.  He was found to have severe obstructive sleep apnea with an AHI of 46.8 per hour.  Events were more severe with supine sleep.  He had abnormal sleep architecture with absence of slow-wave sleep and in rem sleep.  On the diagnostic portion of the study.  With CPAP.  He had significant rim rebound.  He had loud snoring.  During the diagnostic portion of the study.  He'll total.  He was titrated up to 10 cm water pressure has been on CPAP therapy since.  A download was obtained from his area 10 AutoSet ResMed CPAP unit from 05/12/2016 through October 2 22 2017.  He is meeting compliance standards.  Usage was 93%.  There were some days where the patient has awakened and his mask was off, but he did not recall taking it off.  At 10 cm water pressure.  AHI is excellent at 1.7 per hour.  He is unaware of any breakthrough snoring.  His sleep is now restorative.  Previously he had significant nocturia, which has essentially resolved.  He continues to have100% CPAP compliance.  He is awaiting a new mask since his current one has developed a significant leak.  When I last saw him in April 2018 as result of cervical disc disease he was having to undergo future  C4-C6 cervical disc surgery by Dr. Rita Ohara.  When I had seen him previously, I recommended that he  wait at least one year following his acute coronary syndrome so that  dual platelet therapy be continued for one-year duration following his ACS.  A preoperative new nuclear perfusion study showed an EF at 54%.  There was a medium defect with mild ischemia in the basal inferior, mid inferior location.  I had recommended that he return to discuss this result.  He could not make the scheduled date that was initially recommended.  He comes to the office today, 2 months later for reevaluation.  He states he will require cervical neck surgery and has noticed slight progression of his left arm paresthesias.  He denied chest  pain.  He never had chest pain prior to his MI.  He had noticed some mild exertional dyspnea.   Last saw him in December 2021 and had not seen him for 3-1/2 years prior to that evaluation. He was working work as a Therapist, sports in the produce department.  He has had some issues with low back discomfort involving his left hip and has been seeing an orthopedist at [place].  He admits to significant weight gain as result of Covid.  He had reduced his weight down to 190 and 219 and his weight is now increased to 245 pounds.  He missed several doses of his medication over the weekend and noted significant blood pressure elevation to 182/124.  He had not been routinely exercising.  His 73 year old son was diagnosed with Crohn's disease and to obtaining a diagnosis there was significant increased stress.  He has continued to be on CPAP therapy and cannot sleep without it. Presently he has been on amlodipine 10 mg, metoprolol tartrate 50 mg twice a day.  He has been on rosuvastatin 40 mg and over-the-counter fish oil 2 g twice a day.    Since I last saw him, he has been following with [name].  In November 2022 he was started on a glycerin for lipid management and continues to be on rosuvastatin 40 mg daily.  Apparently there was a period where he stopped a lot of his medications including beta-blocker therapy.  Presently he is on amlodipine 10 mg, olmesartan 40 mg, rosuvastatin 40 mg in addition to Leqvio every 37-monthinjections.  He is on Lexapro for depression.  He continues to use CPAP.  Blood was obtained today from April 19 through May 18 which shows excellent compliance with average use at 7 hours and 23 minutes per night.  CPAP is set at a pressure range of 10 to 20 cm and his AHI is 2.8.  He has had significant delta 3 with his most recent mask.  He is previous Respironics fullface mask was called back due to magnets and he now has been using a fullface mask with the tubing comes to  the front which he does not like.  He had undergone some laboratory in February 2023.  Total cholesterol was 67 HDL 42 LDL had dropped to 6 with triglycerides still elevated at 177.  He has not had recent thyroid studies or renal function ALT was increased at 68.  Denies any chest pain.  He typically goes to bed 9 30-10 and wakes up at 4 AM to get his children ready for school.  Also is going to school himself working on a nursing degree.  He presents for evaluation.   Past Medical History:  Diagnosis Date   Cervical radiculopathy at C5 01/27/2016   left   Coronary artery disease 08/2015   Depression  GERD (gastroesophageal reflux disease)    History of kidney stones    Hyperlipidemia LDL goal <70    Hypertension    Hypertriglyceridemia    Left arm weakness    Myocardial infarction ([place]) 08/2015   OCD (obsessive compulsive disorder)    Sleep apnea     Past Surgical History:  Procedure Laterality Date   ANTERIOR CERVICAL DECOMP/DISCECTOMY FUSION N/A 05/01/2017   Procedure: Anterior Cervical Decompression Fusion Cervical Four-Five Cervical Five-Six;  Surgeon: Jovita Gamma, MD;  Location: [facility];  Service: Neurosurgery;  Laterality: N/A;  Anterior Cervical Decompression Fusion Cervical Four-Five Cervical Five-Six   CARDIAC CATHETERIZATION N/A 09/15/2015   Procedure: Left Heart Cath and Coronary Angiography;  Surgeon: Troy Sine, MD; LAD 30%, pRCA 30%, dRCA 30%, RPDA 99%, nl LV function    CARDIAC CATHETERIZATION N/A 09/15/2015   Procedure: Coronary Stent Intervention;  Surgeon: Troy Sine, MD; 3.515 mm Xience Alpine DES to dRCA/RPDA>> 5% residual      LEFT HEART CATH AND CORONARY ANGIOGRAPHY N/A 11/28/2016   Procedure: Left Heart Cath and Coronary Angiography;  Surgeon: Troy Sine, MD;  Location: [facility];  Service: Cardiovascular;  Laterality: N/A;   TONSILLECTOMY      Allergies  Allergen Reactions   Amoxicillin Rash   Penicillins Rash and Other (See Comments)     Has patient had a PCN reaction causing immediate rash, facial/tongue/throat swelling, SOB or lightheadedness with hypotension: Yes Has patient had a PCN reaction causing severe rash involving mucus membranes or skin necrosis: No Has patient had a PCN reaction that required hospitalization No Has patient had a PCN reaction occurring within the last 10 years: No If all of the above answers are "NO", then may proceed with Cephalosporin use.     Current Outpatient Medications  Medication Sig Dispense Refill   amLODipine (NORVASC) 10 MG tablet Take 1 tablet by mouth daily.     aspirin EC 81 MG EC tablet Take 1 tablet (81 mg total) by mouth daily. (Patient taking differently: Take 81 mg by mouth every evening.)     diclofenac (VOLTAREN) 75 MG EC tablet TAKE 1 TABLET BY MOUTH TWICE A DAY 50 tablet 2   escitalopram (LEXAPRO) 10 MG tablet Take 1 tablet by mouth daily.     inclisiran (LEQVIO) 284 MG/1.5ML SOSY injection as directed Subcutaneous every 6 months     metoprolol succinate (TOPROL-XL) 50 MG 24 hr tablet Take 1 tablet (50 mg total) by mouth daily. Take with or immediately following a meal. 90 tablet 3   Multiple Vitamin (MULTIVITAMIN WITH MINERALS) TABS tablet Take 1 tablet by mouth daily.     nitroGLYCERIN (NITROSTAT) 0.4 MG SL tablet Place 1 tablet (0.4 mg total) under the tongue every 5 (five) minutes as needed for chest pain (CP or SOB). 25 tablet 12   olmesartan (BENICAR) 40 MG tablet 1 tablet     rosuvastatin (CRESTOR) 40 MG tablet Take 1 tablet by mouth daily.     No current facility-administered medications for this visit.    Social History   Socioeconomic History   Marital status: Legally Separated    Spouse name: Not on file   Number of children: 3   Years of education: Some Coll   Highest education level: Not on file  Occupational History   Occupation: Lawyer: [employer]  Tobacco Use   Smoking status: Never   Smokeless tobacco: Never   Vaping Use   Vaping  Use: Never used  Substance and Sexual Activity   Alcohol use: Yes    Alcohol/week: 0.0 standard drinks    Comment: Rarely   Drug use: No   Sexual activity: Yes  Other Topics Concern   Not on file  Social History Narrative   Multiple family members on his father's side have/had CAD at a young age, uncles and grandparents.   Lives at home with his wife and three children.   Right-handed.   2-3 cups caffeine per week.   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  When I saw him in 2018 he and his wife had separated.  He has 3 children.  His 24 year old son was diagnosed with Crohn's disease which is started on Remicade.  Family History  Problem Relation Age of Onset   Heart attack Father 24       approx age of first stent   Stroke Father    Hypertension Father    Skin cancer Mother    Hyperlipidemia Sister    Stroke Paternal Grandmother    Hypertension Paternal Grandmother    Hypertension Maternal Grandmother    Hypertension Maternal Grandfather    Diabetes Maternal Grandfather    Hypertension Paternal Grandfather    Diabetes Paternal Grandfather    Heart disease Paternal Grandfather     ROS General: Negative; No fevers, chills, or night sweats HEENT: Negative; No changes in vision or hearing, sinus congestion, difficulty swallowing Pulmonary: Negative; No cough, wheezing, shortness of breath, hemoptysis Cardiovascular: See HPI: No chest pain, presyncope, syncope, palpatations GI: Negative; No nausea, vomiting, diarrhea, or abdominal pain GU: Negative; No dysuria, hematuria, or difficulty voiding Musculoskeletal: He is  in need for cervical C3-6 surgery.   Hematologic: Negative; no easy bruising, bleeding Endocrine: Negative; no heat/cold intolerance; no diabetes, Neuro: Negative; no changes in balance, headaches Skin: Negative; No rashes or skin lesions Psychiatric: Negative; No behavioral problems, depression Sleep: Positive for snoring, and occasional daytime sleepiness, Now on CPAP therapy with resolution of symptoms; no bruxism, restless legs, hypnogognic hallucinations. Other comprehensive 14 point system review is negative   Physical Exam BP (!) 158/93   Pulse 96   Ht 5' 9"  (1.753 m)   Wt 248 lb 12.8 oz (112.9 kg)   SpO2 96%   BMI 36.74 kg/m    Resting blood pressure by me was elevated at 158/92 resting pulse was in the upper 90s  Wt Readings from Last 3 Encounters:  01/08/22 248 lb 12.8 oz (112.9 kg)  07/27/20 245 lb (111.1 kg)  04/30/17 243 lb (110.2 kg)   When I had seen him in 2018 he weighed 221. He had purposeful weight loss and his  weight had dropped to 190 pounds in 2019.   Weight today is significantly increased at 248.  General: Alert, oriented, no distress.  Skin: normal turgor, no rashes, warm and dry HEENT: Normocephalic, atraumatic. Pupils equal round and reactive to light; sclera anicteric; extraocular muscles intact; Fundi ** Nose without nasal septal hypertrophy Mouth/Parynx benign; Mallinpatti scale 3 Neck: No JVD, no carotid bruits; normal carotid upstroke Lungs: clear to ausculatation and percussion; no wheezing or rales  Chest wall: without tenderness to palpitation Heart: PMI not displaced, RRR, s1 s2 normal, 1/6 systolic murmur, no diastolic murmur, no rubs, gallops, thrills, or heaves Abdomen: soft, nontender; no hepatosplenomehaly, BS+; abdominal aorta nontender and not dilated by palpation. Back: no CVA tenderness Pulses 2+ Musculoskeletal: full range of motion, normal strength, no joint deformities Extremities: no clubbing cyanosis or edema, Homan's sign  negative  Neurologic: grossly nonfocal; Cranial nerves grossly wnl Psychologic: Normal mood and affect  Jan 08, 2022 ECG (independently read by me): Normal sinus rhythm at 96 bpm.  Right axis.  Poor R wave progression.  July 27, 2020 ECG (independently read by me): Normal sinus rhythm at 79 bpm.  T wave inversion in lead III.  No ectopy.  Normal intervals.  November 20, 2016  ECG (independently read by me): Normal sinus rhythm at 63 bpm.  Mild T wave inversion in III.  January 2018 ECG (independently read by me): Normal sinus rhythm at 78 bpm.  PR interval 142 ms, QTc interval 435 ms.  No ECG evidence for his prior myocardial infarction.  October 2017 ECG (independently read by me): Normal sinus rhythm at 91 bpm.  No ectopy.  Normal intervals.  May 2017 ECG (independently read by me): Normal sinus rhythm at 73 bpm.  Small nondiagnostic inferior Q waves with T-wave inversion.  LABS:     Latest Ref Rng & Units 05/01/2017    7:08 AM 11/28/2016    7:30 AM 11/26/2016    4:54 PM  BMP  Glucose 65 - 99 mg/dL 116    89    BUN 6 - 20 mg/dL 11    23    Creatinine 0.61 - 1.24 mg/dL 0.75    0.77    Sodium 135 - 145 mmol/L 138    139    Potassium 3.5 - 5.1 mmol/L 4.3   4.0   5.5    Chloride 101 - 111 mmol/L 109    107    CO2 22 - 32 mmol/L 21    23    Calcium 8.9 - 10.3 mg/dL 8.8    9.1          Latest Ref Rng & Units 09/15/2015    4:43 AM 03/08/2014    9:24 PM  Hepatic Function  Total Protein 6.5 - 8.1 g/dL 6.4   7.1    Albumin 3.5 - 5.0 g/dL 4.0   4.5    AST 15 - 41 U/L 36   31    ALT 17 - 63 U/L 54   53    Alk Phosphatase 38 - 126 U/L 66   84    Total Bilirubin 0.3 - 1.2 mg/dL 0.8   0.3         Latest Ref Rng & Units 04/30/2017    8:42 AM 11/26/2016    4:54 PM 09/16/2015    3:35 AM  CBC  WBC 4.0 - 10.5 K/uL 6.3   8.0   10.9    Hemoglobin 13.0 - 17.0 g/dL 16.2   14.8   13.7    Hematocrit 39.0 - 52.0 % 48.8   44.2   40.8    Platelets 150 - 400 K/uL 231   238   223     Lab Results   Component Value Date   MCV 83.1 04/30/2017   MCV 81.7 11/26/2016   MCV 82.1 09/16/2015    Lab Results  Component Value Date   TSH 5.921 (H) 09/17/2015  BNP No results found for: BNP  ProBNP No results found for: PROBNP   Lipid Panel     Component Value Date/Time   CHOL 216 (H) 09/16/2015 0335   TRIG 635 (H) 09/16/2015 0335   HDL 32 (L) 09/16/2015 0335   CHOLHDL 6.8 09/16/2015 0335   VLDL UNABLE TO CALCULATE IF TRIGLYCERIDE OVER 400 mg/dL 09/16/2015 0335   LDLCALC UNABLE TO CALCULATE IF TRIGLYCERIDE OVER 400 mg/dL 09/16/2015 0335     RADIOLOGY: No results found.  IMPRESSION:  1. History of MI (myocardial infarction): PCI DES stent to RCA 09/15/2015   2. Primary hypertension   3. OSA (obstructive sleep apnea)   4. Hyperlipidemia with target LDL less than 70   5. Elevated LFTs   6. Obesity, Class II, BMI 35-39.9   7. Medication management     ASSESSMENT AND PLAN: Richard Hull is a 46 year old male who had a history of previously untreated hyperlipidemia, as well as hypertension.  He was awakened from sleep on the morning of 09/15/2015 with chest pain and was found to have inferior ST elevation.  He underwent successful emergent catheterization and intervention to an ulcerated plaque in the RCA proximal to the PDA takeoff and there was evidence for distal embolization of clot to the PDA.  He underwent successful stenting at the site of the ulcerated plaque and PTCA in the distal PDA.  Since his MI awakened him from sleep I was concerned about sleep apnea which may have been contributory to his symptomatology.  This may have contributed to increased inflammation with ultimate plaque rupture during potential hypoxemia.  He was found to have severe obstructive sleep apnea with an AHI of 46.8 per hour.   CPAP therapy was initiated and he is continue to use CPAP excellent compliance and does not sleep without it.  A downloadfrom November 7 through July 25, 2020 shows  100% compliance with average use at 7 hours.  He is CPAP auto is set at a minimum pressure of 10 and maximum of 20 and his 95th percentile pressure is 12.7 with a maximum average pressure at 14.1.  AHI is 3.4.  He continues to note the benefits of CPAP with being more alert, no longer snoring, marked improvement in previous significant nocturia and lack of daytime sleepiness.  Presently, he has had issues with a mask which was replaced since his Respironics fullface mask under the nose was recalled due to the magnets.  As result he has been using a fullface mask which has not been as beneficial.  In the office today I provided him with a new ResMed AirFit F30i mask which is similar to the Respironics DreamWear mask should do exceptionally well particular with the tubing coming from the crown of the head.  His blood pressure today is significantly elevated.  Since I last saw him he had stopped his beta-blocker therapy.  His resting pulse is in the upper 90s to 100.  I am recommending the addition of metoprolol succinate 50 mg to take in addition to his olmesartan 40 mg and amlodipine 10 mg.  He continues to be on a baby aspirin.  He recently had LFTs checked and ALT was elevated on September 28, 2021 at 65.  Since initiating and closer on his LDL has plummeted to 6.  I have recommended repeating fasting lipid panel and chemistry as well as TSH and a CBC and will also check an LP(a).  If LDL remains very low and LFTs are increased it  may be worthwhile to reduce or even discontinue his statin therapy..  We discussed the importance of weight loss and increased exercise.  I discussed optimal sleep duration at 7 and 9 hours.  Will be seeing [name] in August.  I will see him in 6 months for reevaluation or sooner as needed.   Troy Sine, MD, [facility]  01/08/2022 2:02 PM

## 2022-01-08 NOTE — Patient Instructions (Signed)
Medication Instructions:  START: Metoprolol Succinate 50 mg daily  *If you need a refill on your cardiac medications before your next appointment, please call your pharmacy*   Lab Work: Your physician recommends that you return for lab work in 1-2 weeks (CMP, TSH, Lipid, LPa, CBC)  If you have labs (blood work) drawn today and your tests are completely normal, you will receive your results only by: MyChart Message (if you have MyChart) OR A paper copy in the mail If you have any lab test that is abnormal or we need to change your treatment, we will call you to review the results.   Testing/Procedures: None ordered today   Follow-Up: At [facility], you and your health needs are our priority.  As part of our continuing mission to provide you with exceptional heart care, we have created designated Provider Care Teams.  These Care Teams include your primary Cardiologist (physician) and Advanced Practice Providers (APPs -  Physician Assistants and Nurse Practitioners) who all work together to provide you with the care you need, when you need it.  We recommend signing up for the patient portal called "MyChart".  Sign up information is provided on this After Visit Summary.  MyChart is used to connect with patients for Virtual Visits (Telemedicine).  Patients are able to view lab/test results, encounter notes, upcoming appointments, etc.  Non-urgent messages can be sent to your provider as well.   To learn more about what you can do with MyChart, go to [email or web address].    Your next appointment:   6 month(s)  The format for your next appointment:   In Person  Provider:   Nicki Guadalajara, MD {    Important Information About Sugar

## 2022-02-19 DIAGNOSIS — G4733 Obstructive sleep apnea (adult) (pediatric): Secondary | ICD-10-CM | POA: Diagnosis not present

## 2022-02-21 ENCOUNTER — Other Ambulatory Visit: Payer: Self-pay

## 2022-02-21 DIAGNOSIS — Z79899 Other long term (current) drug therapy: Secondary | ICD-10-CM | POA: Diagnosis not present

## 2022-02-22 ENCOUNTER — Other Ambulatory Visit (HOSPITAL_COMMUNITY): Payer: Self-pay | Admitting: *Deleted

## 2022-02-22 LAB — COMPREHENSIVE METABOLIC PANEL
ALT: 56 IU/L — ABNORMAL HIGH (ref 0–44)
AST: 53 IU/L — ABNORMAL HIGH (ref 0–40)
Albumin/Globulin Ratio: 2.1 (ref 1.2–2.2)
Albumin: 4.5 g/dL (ref 4.0–5.0)
Alkaline Phosphatase: 94 IU/L (ref 44–121)
BUN/Creatinine Ratio: 16 (ref 9–20)
BUN: 12 mg/dL (ref 6–24)
Bilirubin Total: 0.3 mg/dL (ref 0.0–1.2)
CO2: 24 mmol/L (ref 20–29)
Calcium: 9.4 mg/dL (ref 8.7–10.2)
Chloride: 102 mmol/L (ref 96–106)
Creatinine, Ser: 0.73 mg/dL — ABNORMAL LOW (ref 0.76–1.27)
Globulin, Total: 2.1 g/dL (ref 1.5–4.5)
Glucose: 90 mg/dL (ref 70–99)
Potassium: 4.8 mmol/L (ref 3.5–5.2)
Sodium: 140 mmol/L (ref 134–144)
Total Protein: 6.6 g/dL (ref 6.0–8.5)
eGFR: 114 mL/min/{1.73_m2} (ref >59)

## 2022-02-22 LAB — CBC
Hematocrit: 45.7 % (ref 37.5–51.0)
Hemoglobin: 15.3 g/dL (ref 13.0–17.7)
MCH: 27.2 pg (ref 26.6–33.0)
MCHC: 33.5 g/dL (ref 31.5–35.7)
MCV: 81 fL (ref 79–97)
Platelets: 230 10*3/uL (ref 150–450)
RBC: 5.62 x10E6/uL (ref 4.14–5.80)
RDW: 13.4 % (ref 11.6–15.4)
WBC: 6.5 10*3/uL (ref 3.4–10.8)

## 2022-02-22 LAB — LIPID PANEL
Chol/HDL Ratio: 3.8 ratio (ref 0.0–5.0)
Cholesterol, Total: 122 mg/dL (ref 100–199)
HDL: 32 mg/dL — ABNORMAL LOW (ref >39)
LDL Chol Calc (NIH): 28 mg/dL (ref 0–99)
Triglycerides: 443 mg/dL — ABNORMAL HIGH (ref 0–149)
VLDL Cholesterol Cal: 62 mg/dL — ABNORMAL HIGH (ref 5–40)

## 2022-02-22 LAB — LIPOPROTEIN A (LPA): Lipoprotein (a): 11.8 nmol/L (ref <75.0)

## 2022-02-22 LAB — TSH: TSH: 4.56 u[IU]/mL — ABNORMAL HIGH (ref 0.450–4.500)

## 2022-02-23 ENCOUNTER — Ambulatory Visit (HOSPITAL_COMMUNITY)
Admission: RE | Admit: 2022-02-23 | Discharge: 2022-02-23 | Disposition: A | Discharge status: Home / Self Care | Payer: BC Managed Care – PPO | Source: Ambulatory Visit | Attending: Internal Medicine | Admitting: Internal Medicine

## 2022-02-23 ENCOUNTER — Telehealth: Payer: Self-pay | Admitting: Pharmacy Technician

## 2022-02-23 DIAGNOSIS — E781 Pure hyperglyceridemia: Secondary | ICD-10-CM | POA: Insufficient documentation

## 2022-02-23 DIAGNOSIS — E785 Hyperlipidemia, unspecified: Secondary | ICD-10-CM | POA: Insufficient documentation

## 2022-02-23 DIAGNOSIS — I2510 Atherosclerotic heart disease of native coronary artery without angina pectoris: Secondary | ICD-10-CM | POA: Diagnosis not present

## 2022-02-23 MED ORDER — INCLISIRAN SODIUM 284 MG/1.5ML ~~LOC~~ SOSY
PREFILLED_SYRINGE | SUBCUTANEOUS | Status: AC
Start: 2022-02-23 — End: 2022-02-23
  Filled 2022-02-23: qty 1.5

## 2022-02-23 MED ORDER — INCLISIRAN SODIUM 284 MG/1.5ML ~~LOC~~ SOSY
284.0000 mg | PREFILLED_SYRINGE | Freq: Once | SUBCUTANEOUS | Status: AC
  Administered 2022-02-23: 284 mg via SUBCUTANEOUS

## 2022-02-23 NOTE — Telephone Encounter (Addendum)
Auth Submission: no auth needed Payer: [employer] - [employer] Medication & CPT/J Code(s) submitted: LEQVIO [id number] Route of submission (phone, fax, portal): PHONE: [phone] Auth type: Buy/Bill Units/visits requested: 4 DOSES Reference number: [id number] Connie-B 02/23/22 @11 .06 Approval from: 02/23/22 to 02/23/22   Patient goes to [facility] for treatment. Referral will be discontinued

## 2022-02-26 ENCOUNTER — Encounter: Payer: Self-pay | Admitting: Cardiovascular Disease

## 2022-02-27 ENCOUNTER — Encounter: Payer: Self-pay | Admitting: Cardiovascular Disease

## 2022-02-27 MED ORDER — ICOSAPENT ETHYL 1 G PO CAPS
2.0000 g | ORAL_CAPSULE | Freq: Two times a day (BID) | ORAL | 3 refills | Status: DC
Start: 2022-02-27 — End: 2023-05-01

## 2022-02-27 MED ORDER — ROSUVASTATIN CALCIUM 20 MG PO TABS
20.0000 mg | ORAL_TABLET | Freq: Every day | ORAL | 3 refills | Status: DC
Start: 2022-02-27 — End: 2023-04-30

## 2022-03-07 ENCOUNTER — Telehealth: Payer: Self-pay

## 2022-03-07 NOTE — Telephone Encounter (Signed)
Received information in regards to patient Vascepa 1 gm capsules.   Key: [id number] Last name: Zapanta  DOB: 15-May-1976  Thank you!

## 2022-03-08 NOTE — Telephone Encounter (Signed)
**Note De-Identified Meaghan Whistler Obfuscation** Tim Loney Hering Key: Elenora Gamma - PA Case ID: [id number] - Rx #: [id number] Outcome: Approved today ICOSAPENT CAP 1GM is approved through 03/09/2023. Your patient may now fill this prescription and it will be covered. Drug Icosapent Ethyl 1GM capsules Form [employer] Electronic Prior Authorization Form (2017 NCPDP) Original Claim Info 75 Drug Requires Prior Authorization  I have notified [id number] - [place], [place] - [address] AT [facility] (Ph: [phone]) if this approval.

## 2022-03-08 NOTE — Telephone Encounter (Signed)
Thank you :)

## 2022-03-08 NOTE — Telephone Encounter (Signed)
**Note De-Identified Richard Hull Obfuscation** Icosapent PA started through covermymeds. Key: [id number]

## 2022-03-20 DIAGNOSIS — Z111 Encounter for screening for respiratory tuberculosis: Secondary | ICD-10-CM | POA: Diagnosis not present

## 2022-03-20 DIAGNOSIS — Z23 Encounter for immunization: Secondary | ICD-10-CM | POA: Diagnosis not present

## 2022-03-22 DIAGNOSIS — G4733 Obstructive sleep apnea (adult) (pediatric): Secondary | ICD-10-CM | POA: Diagnosis not present

## 2022-03-26 DIAGNOSIS — E785 Hyperlipidemia, unspecified: Secondary | ICD-10-CM | POA: Diagnosis not present

## 2022-03-26 DIAGNOSIS — Z125 Encounter for screening for malignant neoplasm of prostate: Secondary | ICD-10-CM | POA: Diagnosis not present

## 2022-03-26 DIAGNOSIS — R7301 Impaired fasting glucose: Secondary | ICD-10-CM | POA: Diagnosis not present

## 2022-03-28 DIAGNOSIS — Z0184 Encounter for antibody response examination: Secondary | ICD-10-CM | POA: Diagnosis not present

## 2022-03-29 ENCOUNTER — Other Ambulatory Visit: Payer: Self-pay | Admitting: Pharmacy Technician

## 2022-04-02 DIAGNOSIS — Z23 Encounter for immunization: Secondary | ICD-10-CM | POA: Diagnosis not present

## 2022-04-02 DIAGNOSIS — Z0000 Encounter for general adult medical examination without abnormal findings: Secondary | ICD-10-CM | POA: Diagnosis not present

## 2022-04-02 DIAGNOSIS — Z1331 Encounter for screening for depression: Secondary | ICD-10-CM | POA: Diagnosis not present

## 2022-04-02 DIAGNOSIS — I119 Hypertensive heart disease without heart failure: Secondary | ICD-10-CM | POA: Diagnosis not present

## 2022-04-06 ENCOUNTER — Ambulatory Visit (INDEPENDENT_AMBULATORY_CARE_PROVIDER_SITE_OTHER): Payer: BC Managed Care – PPO

## 2022-04-06 ENCOUNTER — Ambulatory Visit (INDEPENDENT_AMBULATORY_CARE_PROVIDER_SITE_OTHER): Payer: BC Managed Care – PPO | Admitting: Podiatry

## 2022-04-06 DIAGNOSIS — M722 Plantar fascial fibromatosis: Secondary | ICD-10-CM

## 2022-04-06 MED ORDER — TRIAMCINOLONE ACETONIDE 10 MG/ML IJ SUSP
10.0000 mg | Freq: Once | INTRAMUSCULAR | Status: AC
  Administered 2022-04-06: 10 mg

## 2022-04-06 MED ORDER — DICLOFENAC SODIUM 75 MG PO TBEC: 75.0000 mg | DELAYED_RELEASE_TABLET | Freq: Two times a day (BID) | ORAL | 2 refills | Status: DC

## 2022-04-06 NOTE — Progress Notes (Signed)
Subjective:   Patient ID: Richard Hull, male   DOB: 46 y.o.   MRN: [id number]   HPI Pain patient has had a reoccurrence of plantar fascial inflammation right with mild Achilles tendinitis left.  He started to hike again and over the last 3 weeks to become sore   ROS      Objective:  Physical Exam  Neuro vascular status intact with discomfort in the plantar aspect of the right heel and into the center and lateral portion of the tendon with patient being a very heavy heel walker with minimal discomfort on the posterior left heel     Assessment:  Acute plantar fasciitis right center and slight lateral band of the fascia with mild Achilles tendinitis left     Plan:  H&P reviewed condition and went ahead today did sterile prep and injected the fascia right from the lateral approach 3 mg Kenalog 5 mg Xylocaine discussed physical therapy supportive shoes possible orthotics in future reappoint as symptoms indicate  X-rays indicate small spur no indication stress fracture arthritis associated with condition

## 2022-04-09 ENCOUNTER — Telehealth: Payer: Self-pay | Admitting: *Deleted

## 2022-04-09 DIAGNOSIS — Z111 Encounter for screening for respiratory tuberculosis: Secondary | ICD-10-CM | POA: Diagnosis not present

## 2022-04-09 NOTE — Chronic Care Management (AMB) (Signed)
  Care Coordination   Note   04/09/2022 Name: Richard Hull MRN: [id number] DOB: [birth year]  Richard Hull is a 46 y.o. year old male who sees [name], MD for primary care. I reached out to [name] by phone today to offer care coordination services.  [name] was given information about Care Coordination services today including:   The Care Coordination services include support from the care team which includes your Nurse Coordinator, Clinical Social Worker, or Pharmacist.  The Care Coordination team is here to help remove barriers to the health concerns and goals most important to you. Care Coordination services are voluntary, and the patient may decline or stop services at any time by request to their care team member.   Care Coordination Consent Status: Patient agreed to services and verbal consent obtained.   Follow up plan:  Telephone appointment with care coordination team member scheduled for:  04/12/22  Encounter Outcome:  Pt. Scheduled  [facility] Coordination Care Guide  Direct Dial: [phone]

## 2022-04-12 ENCOUNTER — Ambulatory Visit: Payer: Self-pay

## 2022-04-12 NOTE — Patient Outreach (Signed)
  Care Coordination   Initial Visit Note   04/12/2022 Name: KINGSON LOHMEYER MRN: [id number] DOB: 12-Dec-1975  JAKIAH GOREE is a 46 y.o. year old male who sees [name], MD for primary care. I spoke with  [name] by phone today.  What matters to the patients health and wellness today?  For medication to be more affordable    Goals Addressed             This Visit's Progress    COMPLETED: Care Coordination Activities       Care Coordination Interventions: SDoH screening completed - no acute resource challenges identified at this time Discussed the patient is working on completing an Proofreader - no assistance needed at this time Reviewed patient is on Leqvio injection every 6 weeks that is very costly. Patient reports he has a coupon card but the pharmacy is unsure how to file it Advised SW will reach out to the [facility] pharmacy to determine if they have insight on medication program Education provided on role of RN Care Manager - patient agreeable to speaking with RN Care Manager         SDOH assessments and interventions completed:  Yes  SDOH Interventions Today    Flowsheet Row Most Recent Value  SDOH Interventions   Food Insecurity Interventions Intervention Not Indicated  Housing Interventions Intervention Not Indicated  Transportation Interventions Intervention Not Indicated        Care Coordination Interventions Activated:  Yes  Care Coordination Interventions:  Yes, provided   Follow up plan: Referral made to RN Care Manager    Encounter Outcome:  Pt. Visit Completed   Bevelyn Ngo, BSW, CDP Social Worker, Certified Dementia Practitioner Care Coordination [phone]

## 2022-04-12 NOTE — Patient Instructions (Signed)
Visit Information  Thank you for taking time to visit with me today. Please don't hesitate to contact me if I can be of assistance to you.   Following are the goals we discussed today:   Goals Addressed             This Visit's Progress    COMPLETED: Care Coordination Activities       Care Coordination Interventions: SDoH screening completed - no acute resource challenges identified at this time Discussed the patient is working on completing an Proofreader - no assistance needed at this time Reviewed patient is on Leqvio injection every 6 weeks that is very costly. Patient reports he has a coupon card but the pharmacy is unsure how to file it Advised SW will reach out to the [facility] pharmacy to determine if they have insight on medication program Education provided on role of RN Care Manager - patient agreeable to speaking with RN Care Manager          If you are experiencing a Mental Health or Behavioral Health Crisis or need someone to talk to, please call 1-800-273-TALK (toll free, 24 hour hotline)  Patient verbalizes understanding of instructions and care plan provided today and agrees to view in MyChart. Active MyChart status and patient understanding of how to access instructions and care plan via MyChart confirmed with patient.     The care management team will reach out to the patient again over the next 21 days.   Bevelyn Ngo, BSW, CDP Social Worker, Certified Dementia Practitioner Care Coordination [phone]

## 2022-04-12 NOTE — Patient Outreach (Signed)
  Care Coordination   04/12/2022 Name: Richard Hull MRN: [id number] DOB: Nov 15, 1975   Care Coordination Outreach Attempts:  An unsuccessful telephone outreach was attempted today to offer the patient information about available care coordination services as a benefit of their health plan.   Follow Up Plan:  Additional outreach attempts will be made to offer the patient care coordination information and services.   Encounter Outcome:  No Answer  Care Coordination Interventions Activated:  No   Care Coordination Interventions:  No, not indicated    Bevelyn Ngo, BSW, CDP Social Worker, Certified Dementia Practitioner Care Coordination [phone]

## 2022-04-13 ENCOUNTER — Telehealth: Payer: Self-pay | Admitting: Pharmacist

## 2022-04-13 NOTE — Progress Notes (Signed)
[facility] Customer service manager [facility])  [facility] Quality Pharmacy Team    04/13/2022  VANDER KUEKER 04/21/1976 [id number]  Reason for referral:  Investigation for [facility] Co-Pay Savings Card   Referral source: [facility] Social Worker Current insurance: [employer]   Outreach:  Successful telephone call with Mr. Ishmel Acevedo.  HIPAA identifiers verified.   Subjective:  Mr. Hudgins was given a Leqvio co-pay saving card, which he presented and was informed that someone would follow up with him regarding the status of its use/eligibility, however he has not heard anything. Mr. Shankel reports that he receives his Leqvio through the infusion center and the medication is provided.   Objective: The ASCVD Risk score (Arnett DK, et al., 2019) failed to calculate for the following reasons:   The patient has a prior MI or stroke diagnosis  Lab Results  Component Value Date   CREATININE 0.73 (L) 02/21/2022   CREATININE 0.75 05/01/2017   CREATININE 0.77 11/26/2016    Lab Results  Component Value Date   HGBA1C 6.2 (H) 09/16/2015    Lipid Panel     Component Value Date/Time   CHOL 122 02/21/2022 1213   TRIG 443 (H) 02/21/2022 1213   HDL 32 (L) 02/21/2022 1213   CHOLHDL 3.8 02/21/2022 1213   CHOLHDL 6.8 09/16/2015 0335   VLDL UNABLE TO CALCULATE IF TRIGLYCERIDE OVER 400 mg/dL 56/97/9480 1655   LDLCALC 28 02/21/2022 1213    BP Readings from Last 3 Encounters:  02/23/22 140/83  01/08/22 (!) 158/93  09/08/21 (!) 139/97    Allergies  Allergen Reactions   Amoxicillin Rash   Penicillins Rash and Other (See Comments)    Has patient had a PCN reaction causing immediate rash, facial/tongue/throat swelling, SOB or lightheadedness with hypotension: Yes Has patient had a PCN reaction causing severe rash involving mucus membranes or skin necrosis: No Has patient had a PCN reaction that required hospitalization No Has patient had a PCN reaction occurring within the last 10 years: No If  all of the above answers are "NO", then may proceed with Cephalosporin use.     Assessment: Informed Mr. Luzier that I would do some research for him and follow up.    Plan: Will follow-up in 1 to 3 business days. Reynold Bowen, PharmD [facility]  [facility] Clinical Pharmacist Office: [phone]

## 2022-04-13 NOTE — Progress Notes (Signed)
[facility] Customer service manager [facility])                                            [facility] Quality Pharmacy Team    04/13/2022  Richard Hull 1975-12-14 [id number]  Placed a follow up telephone call to [name] to relay information regarding his [name] savings card. Left HIPAA compliant voice mail for patient to return my call.   Reynold Bowen, PharmD [facility]  [facility] Clinical Pharmacist Office: [phone]

## 2022-04-16 NOTE — Progress Notes (Signed)
[facility] Customer service manager [facility])                                            [facility] Quality Pharmacy Team    04/16/2022  Richard Hull 02-06-1976 [id number]  Placed telephonic outreach to Mr. Joaquin Music, HIPAA identifiers were confirmed.   I relayed to Mr. Treinen that per the [facility] representative I spoke with, Mr. Pieroni can take his EOB from [employer] from his Leqvio injection and submit to the co-pay savings program for reimbursement.  I provide Mr. Middleton with the form to use in order to file for his reimbursement.  Mr. Sellick had no further questions and thanked me for the information.   Reynold Bowen, PharmD [facility]  [facility] Clinical Pharmacist Office: [phone]

## 2022-04-17 DIAGNOSIS — Z23 Encounter for immunization: Secondary | ICD-10-CM | POA: Diagnosis not present

## 2022-04-22 DIAGNOSIS — G4733 Obstructive sleep apnea (adult) (pediatric): Secondary | ICD-10-CM | POA: Diagnosis not present

## 2022-04-26 DIAGNOSIS — Z23 Encounter for immunization: Secondary | ICD-10-CM | POA: Diagnosis not present

## 2022-04-30 ENCOUNTER — Ambulatory Visit: Payer: Self-pay

## 2022-04-30 NOTE — Patient Outreach (Signed)
  Care Coordination   Initial Visit Note   04/30/2022 Name: ULYSSE SIEMEN MRN: [id number] DOB: 1975-09-16  IKAIKA SHOWERS is a 46 y.o. year old male who sees [name], MD for primary care. I spoke with  [name] by phone today.  What matters to the patients health and wellness today?  Patient will contact copay support for his Leqvio    Goals Addressed               This Visit's Progress     Patient Stated     I need help getting my cholesterol medication (pt-stated)        Care Coordination Interventions: Provider established cholesterol goals reviewed Provided HLD educational materials Provided patient with the contact number for Leqvio copay support for assistance with copayment information        SDOH assessments and interventions completed:  No     Care Coordination Interventions Activated:  Yes  Care Coordination Interventions:  Yes, provided   Follow up plan: Referral made to 05/14/22 @2 :30 PM    Encounter Outcome:  Pt. Request to Call Back

## 2022-04-30 NOTE — Patient Instructions (Signed)
Visit Information  Thank you for taking time to visit with me today. Please don't hesitate to contact me if I can be of assistance to you.   Following are the goals we discussed today:   Goals Addressed               This Visit's Progress     Patient Stated     I need help getting my cholesterol medication (pt-stated)        Care Coordination Interventions: Provider established cholesterol goals reviewed Provided HLD educational materials Provided patient with the contact number for Leqvio copay support for assistance with copayment information     Our next appointment is by telephone on 05/14/22 at 2:30 PM  Please call the care guide team at [phone] if you need to cancel or reschedule your appointment.   If you are experiencing a Mental Health or Behavioral Health Crisis or need someone to talk to, please call 1-800-273-TALK (toll free, 24 hour hotline)  Patient verbalizes understanding of instructions and care plan provided today and agrees to view in MyChart. Active MyChart status and patient understanding of how to access instructions and care plan via MyChart confirmed with patient.     Delsa Sale, RN, BSN, CCM Care Management Coordinator [facility] Care Management Direct Phone: [phone]

## 2022-05-14 ENCOUNTER — Ambulatory Visit: Payer: Self-pay

## 2022-05-14 NOTE — Patient Outreach (Signed)
  Care Coordination   05/14/2022 Name: Richard Hull MRN: [id number] DOB: 05-Dec-1975   Care Coordination Outreach Attempts:  An unsuccessful telephone outreach was attempted for a scheduled appointment today.  Follow Up Plan:  Additional outreach attempts will be made to offer the patient care coordination information and services.   Encounter Outcome:  No Answer  Care Coordination Interventions Activated:  No   Care Coordination Interventions:  No, not indicated    Barb Merino, RN, BSN, CCM Care Management Coordinator [place] Management  Direct Phone: [phone]

## 2022-05-21 DIAGNOSIS — G4733 Obstructive sleep apnea (adult) (pediatric): Secondary | ICD-10-CM | POA: Diagnosis not present

## 2022-05-24 DIAGNOSIS — G4733 Obstructive sleep apnea (adult) (pediatric): Secondary | ICD-10-CM | POA: Diagnosis not present

## 2022-05-30 DIAGNOSIS — Z23 Encounter for immunization: Secondary | ICD-10-CM | POA: Diagnosis not present

## 2022-06-10 IMAGING — US US ABDOMEN COMPLETE
1 series · 14 of 25 positions shown · non-contrast
Comparison: None.

CLINICAL DATA: Elevated LFTs

EXAM:
ABDOMEN ULTRASOUND COMPLETE

[Series 1: us abdomen complete · 0.23mm/px · 14 of 96 slices shown]
[im 1/96]
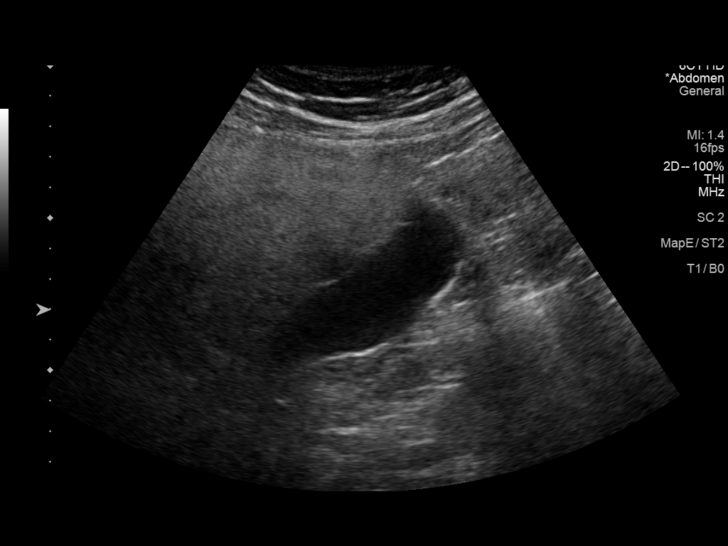
[im 8/96]
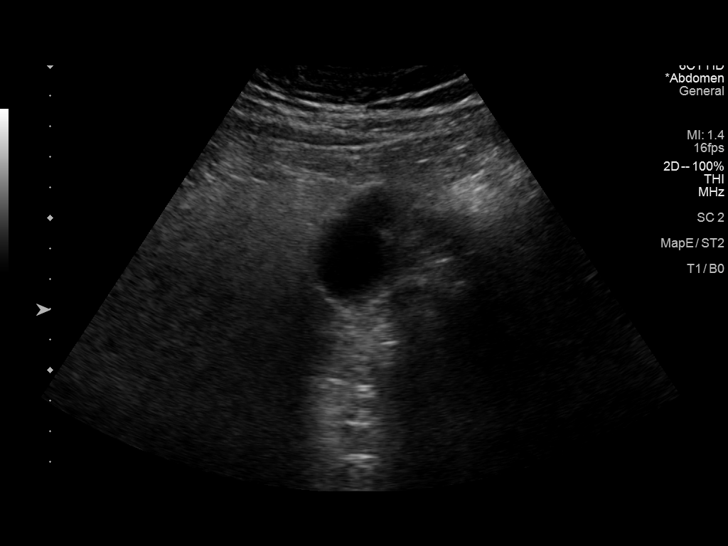
[im 16/96]
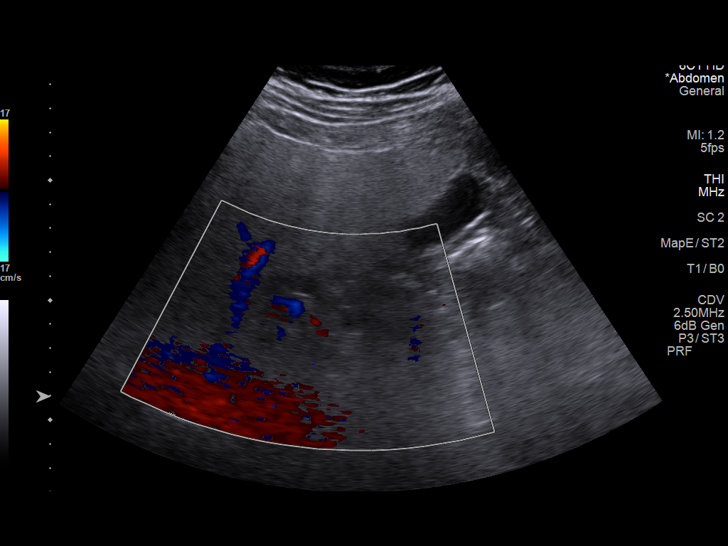
[im 24/96]
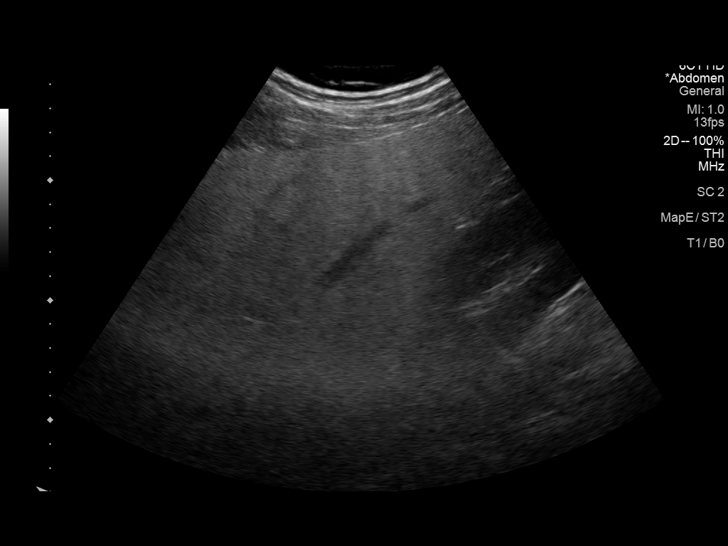
[im 32/96]
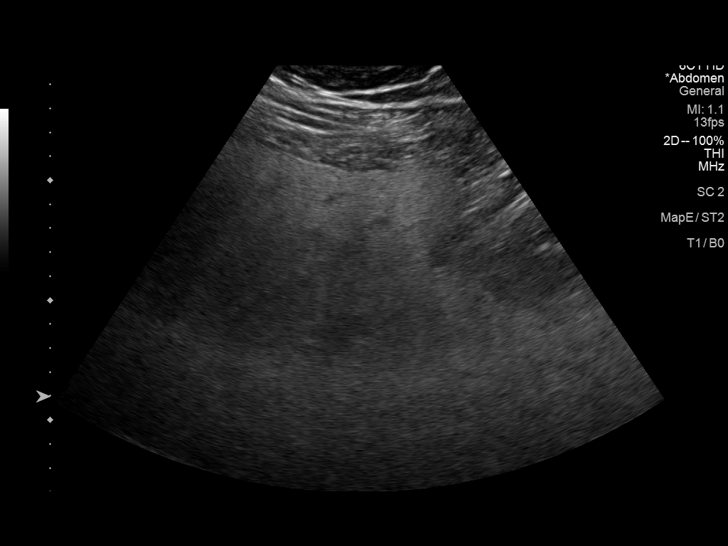
[im 36/96]
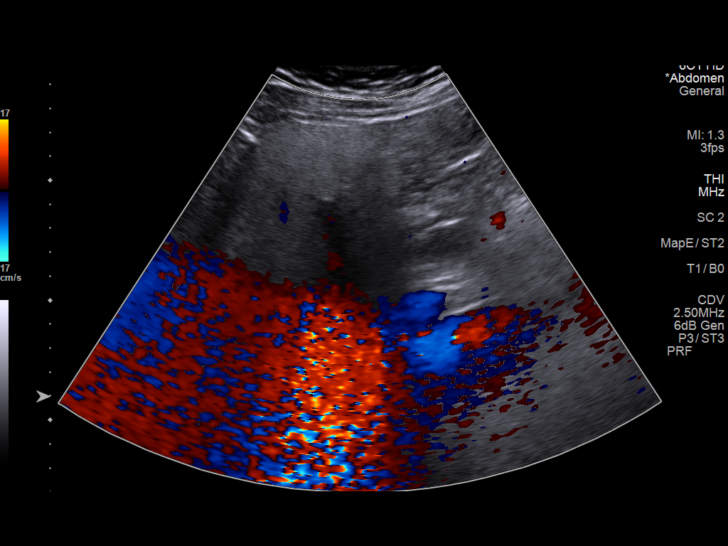
[im 44/96]
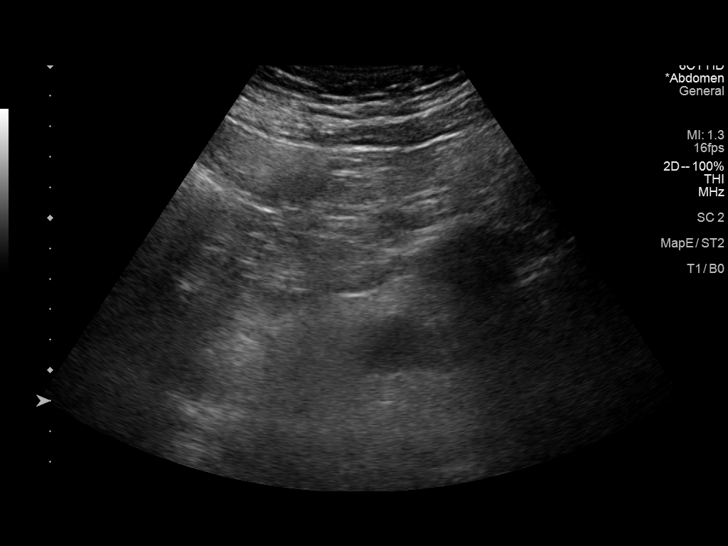
[im 52/96]
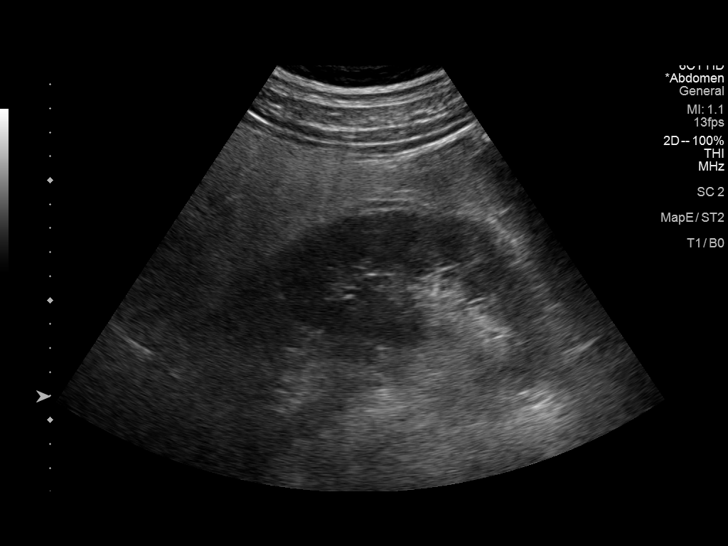
[im 60/96]
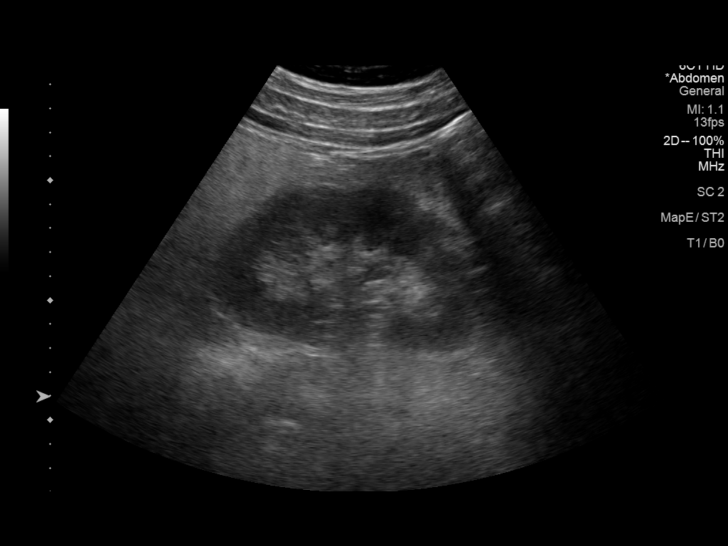
[im 64/96]
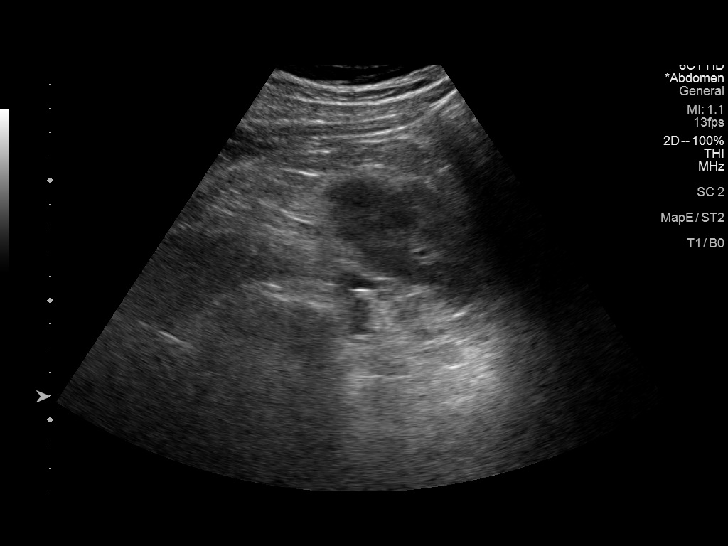
[im 72/96]
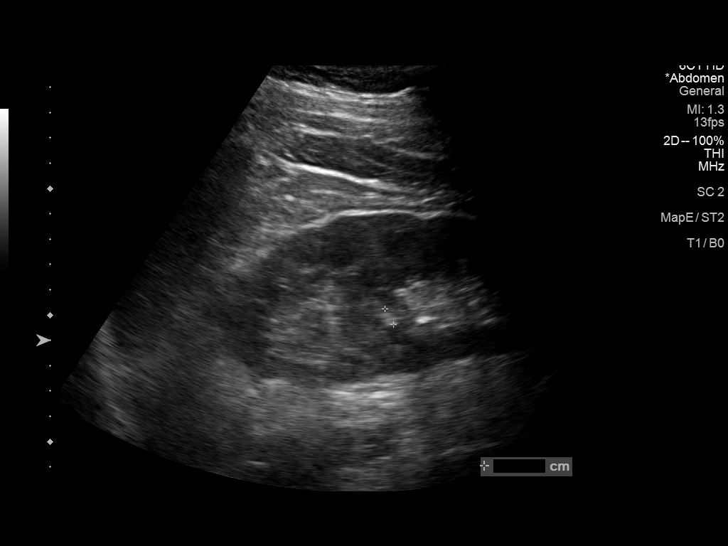
[im 80/96]
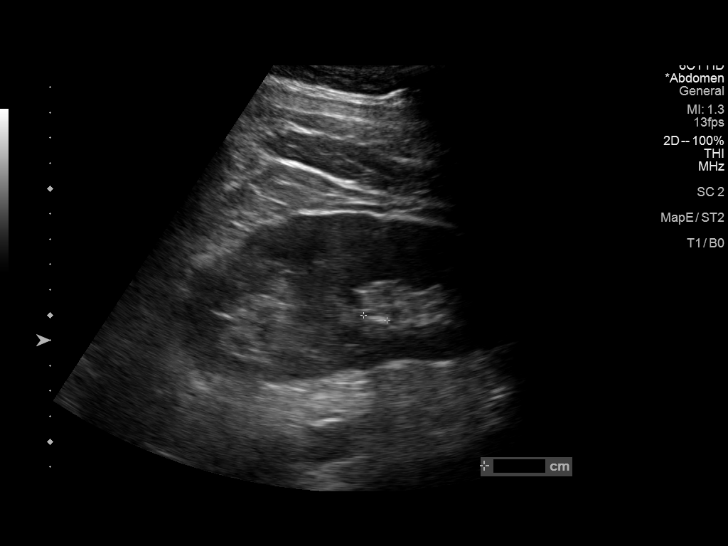
[im 88/96]
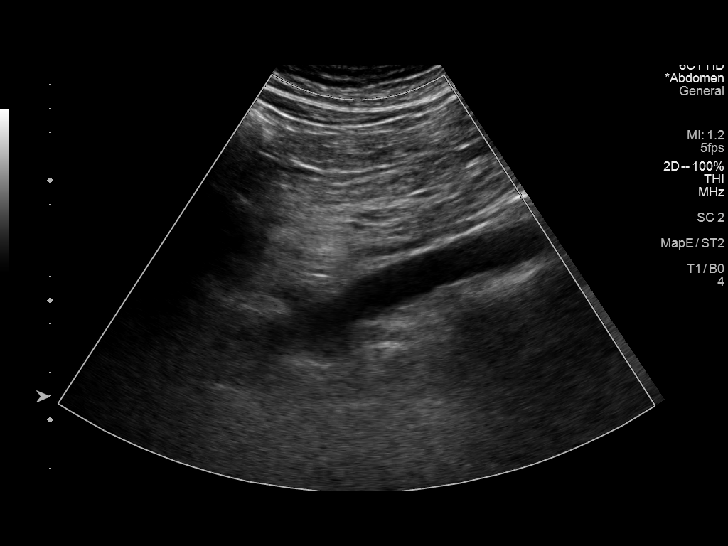
[im 96/96]
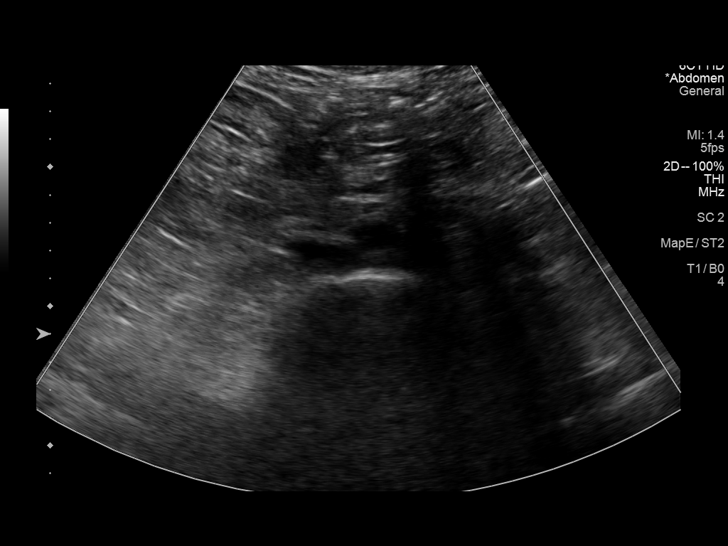

[14 of 25 positions shown; findings below may reference images not displayed]

FINDINGS: Gallbladder: No gallstones or wall thickening visualized. No
sonographic Murphy sign noted by sonographer.

Common bile duct: Diameter: 7 mm

Liver: Increased parenchymal echogenicity. A 1.8 x 1.1 x 2.1 cm area
of low echogenicity along the gallbladder fossa probably reflects
fatty sparing. Portal vein is patent on color Doppler imaging with
normal direction of blood flow towards the liver.

IVC: No abnormality visualized.

Pancreas: Poorly visualized.

Spleen: Size and appearance within normal limits.

Right Kidney: Length: 12.6 cm. Echogenicity within normal limits. No
mass or hydronephrosis visualized.

Left Kidney: Length: 13.1 cm. Echogenicity within normal limits.
There is a 1 cm nonobstructing calculus of the interpolar region. No
mass or hydronephrosis visualized.

Abdominal aorta: No aneurysm visualized.

Other findings: None.
IMPRESSION: Increased liver echogenicity likely reflecting steatosis. Focal low
echogenicity by the gallbladder fossa likely reflects fatty sparing.

Mildly dilated common bile duct.

1 cm nonobstructing left renal calculus.

## 2022-06-20 ENCOUNTER — Ambulatory Visit (INDEPENDENT_AMBULATORY_CARE_PROVIDER_SITE_OTHER): Payer: BC Managed Care – PPO | Admitting: Podiatry

## 2022-06-20 ENCOUNTER — Encounter: Payer: Self-pay | Admitting: Podiatry

## 2022-06-20 DIAGNOSIS — M722 Plantar fascial fibromatosis: Secondary | ICD-10-CM

## 2022-06-20 NOTE — Progress Notes (Signed)
Subjective:   Patient ID: Richard Hull, male   DOB: 46 y.o.   MRN: [id number]   HPI Patient presents stating that he has been gained pain in his right heel stated it did well for.  It is now very sore again   ROS      Objective:  Physical Exam  Neurovascular status intact inflammation more in the center center lateral slight medial plantar fascia at insertion calcaneus     Assessment:  Acute fasciitis-like symptoms that have improved but then get worse and this is happened several different times     Plan:  Reviewed condition and discussed heat ice therapy and I went ahead today casted for functional orthotics to reduce the pressure on his heels and try to provide better support system.  Patient will be seen back when ready

## 2022-06-21 DIAGNOSIS — G4733 Obstructive sleep apnea (adult) (pediatric): Secondary | ICD-10-CM | POA: Diagnosis not present

## 2022-07-03 ENCOUNTER — Encounter: Payer: Self-pay | Admitting: Cardiovascular Disease

## 2022-07-03 ENCOUNTER — Ambulatory Visit: Payer: BC Managed Care – PPO | Attending: Cardiovascular Disease | Admitting: Cardiovascular Disease

## 2022-07-03 ENCOUNTER — Telehealth: Payer: Self-pay | Admitting: *Deleted

## 2022-07-03 VITALS — BP 152/100 | HR 90 | Ht 69.0 in | Wt 249.2 lb

## 2022-07-03 DIAGNOSIS — E782 Mixed hyperlipidemia: Secondary | ICD-10-CM

## 2022-07-03 DIAGNOSIS — G4733 Obstructive sleep apnea (adult) (pediatric): Secondary | ICD-10-CM

## 2022-07-03 DIAGNOSIS — I252 Old myocardial infarction: Secondary | ICD-10-CM

## 2022-07-03 DIAGNOSIS — I10 Essential (primary) hypertension: Secondary | ICD-10-CM

## 2022-07-03 DIAGNOSIS — E785 Hyperlipidemia, unspecified: Secondary | ICD-10-CM

## 2022-07-03 DIAGNOSIS — E669 Obesity, unspecified: Secondary | ICD-10-CM

## 2022-07-03 MED ORDER — VALSARTAN 160 MG PO TABS: 160.0000 mg | ORAL_TABLET | Freq: Every day | ORAL | 3 refills | Status: DC

## 2022-07-03 NOTE — Patient Instructions (Signed)
Medication Instructions:  Your physician has recommended you make the following change in your medication:  STOP: Olmesartan  START: Valsartan 160mg  daily. INCREASE: Metoprolol 75mg  (1.5 tablet ) for the next week and if your heart rate is still in the 70's and above increase to 170m daily.  *If you need a refill on your cardiac medications before your next appointment, please call your pharmacy*   Lab Work: NONE If you have labs (blood work) drawn today and your tests are completely normal, you will receive your results only by: MyChart Message (if you have MyChart) OR A paper copy in the mail If you have any lab test that is abnormal or we need to change your treatment, we will call you to review the results.   Testing/Procedures: NONE   Follow-Up: At [facility], you and your health needs are our priority.  As part of our continuing mission to provide you with exceptional heart care, we have created designated Provider Care Teams.  These Care Teams include your primary Cardiologist (physician) and Advanced Practice Providers (APPs -  Physician Assistants and Nurse Practitioners) who all work together to provide you with the care you need, when you need it.  We recommend signing up for the patient portal called "MyChart".  Sign up information is provided on this After Visit Summary.  MyChart is used to connect with patients for Virtual Visits (Telemedicine).  Patients are able to view lab/test results, encounter notes, upcoming appointments, etc.  Non-urgent messages can be sent to your provider as well.   To learn more about what you can do with MyChart, go to 80m.    Your next appointment:   4 month(s)  The format for your next appointment:   In Person  Provider:   [facility], MD

## 2022-07-03 NOTE — Progress Notes (Signed)
Patient ID: Richard Hull, male   DOB: [birth year], 46 y.o.   MRN: [id number]     Primary M.D.: Dr. Shon Baton  HPI: LUISDAVID HAMBLIN is a 46 y.o. male who presents to the office today for an 6 month follow up cardiology/sleep evaluation.  Mr. Charlie Seda has a history of hyperlipidemia as well as hypertension.  On the morning of 09/15/2015 he was awakened from sleep with severe substernal chest pressure.  He presented to [facility] hospital emergency room where his ECG showed sinus tachycardia with inferior ST segment elevation.  A code STEMI was activated.  He underwent emergent cardiac catheterization by me, which revealed an ulcerated plaque in a large large dominant RCA with distal embolization to the PDA.  Intervention was performed and he ultimately underwent stenting of his RCA proximal to the PDA with insertion of a 3.515 mm Xience Alpine DES stent postdilated 3.75 mm and PTCA of the subtotal mid PDA, which was small caliber and dilated with a 20 balloon.  He was found to have mild concomitant CAD with 30% narrowing in the mid to distal LAD and proximal 30% stenosis.  A subsequent echo done on 09/18/2015 showed an EF of 65-70% without wall motion abnormality.  There was grade 1 diastolic dysfunction.  He was seen for post hospital initial evaluation with [name].  He had developed a dry cough, which was felt to be due to lisinopril, but this ultimately resolved and he has been maintained on lisinopril 40 mg daily, metoprolol 50 mg twice a day, atorvastatin 80 mg with Zetia 10 mg.  He continues to be on dual antiplatelet therapy with aspirin and Brilinta.  He has been taking over-the-counter fish oil.  He has a history of markedly triglycerides and was as high as 1200 several years ago.  When I saw him I was concerned about obstructive sleep apnea in the etiology of his early a.m. MI which awakened him from sleep, suggesting the potential for oxygen desaturation during grams sleep.  I referred  him for a sleep study which confirmed my suspicion.  He was found to have severe obstructive sleep apnea with an AHI of 46.8 per hour.  Events were more severe with supine sleep.  He had abnormal sleep architecture with absence of slow-wave sleep and in rem sleep.  On the diagnostic portion of the study.  With CPAP.  He had significant rim rebound.  He had loud snoring.  During the diagnostic portion of the study.  He'll total.  He was titrated up to 10 cm water pressure has been on CPAP therapy since.  A download was obtained from his area 10 AutoSet ResMed CPAP unit from 05/12/2016 through October 2 22 2017.  He is meeting compliance standards.  Usage was 93%.  There were some days where the patient has awakened and his mask was off, but he did not recall taking it off.  At 10 cm water pressure.  AHI is excellent at 1.7 per hour.  He is unaware of any breakthrough snoring.  His sleep is now restorative.  Previously he had significant nocturia, which has essentially resolved.  He continues to have100% CPAP compliance.  He is awaiting a new mask since his current one has developed a significant leak.  When I last saw him in April 2018 as result of cervical disc disease he was having to undergo future  C4-C6 cervical disc surgery by Dr. Rita Ohara.  When I had seen him previously, I recommended that  he wait at least one year following his acute coronary syndrome so that  dual platelet therapy be continued for one-year duration following his ACS.  A preoperative new nuclear perfusion study showed an EF at 54%.  There was a medium defect with mild ischemia in the basal inferior, mid inferior location.  I had recommended that he return to discuss this result.  He could not make the scheduled date that was initially recommended.  He comes to the office today, 2 months later for reevaluation.  He states he will require cervical neck surgery and has noticed slight progression of his left arm paresthesias.  He denied chest  pain.  He never had chest pain prior to his MI.  He had noticed some mild exertional dyspnea.   Last saw him in December 2021 and had not seen him for 3-1/2 years prior to that evaluation. He was working work as a Therapist, sports in the produce department.  He has had some issues with low back discomfort involving his left hip and has been seeing an orthopedist at [place].  He admits to significant weight gain as result of Covid.  He had reduced his weight down to 190 and 219 and his weight is now increased to 245 pounds.  He missed several doses of his medication over the weekend and noted significant blood pressure elevation to 182/124.  He had not been routinely exercising.  His 51 year old son was diagnosed with Crohn's disease and to obtaining a diagnosis there was significant increased stress.  He has continued to be on CPAP therapy and cannot sleep without it. Presently he has been on amlodipine 10 mg, metoprolol tartrate 50 mg twice a day.  He has been on rosuvastatin 40 mg and over-the-counter fish oil 2 g twice a day.    He has seen [name].   In November 2022 he was started on Leqvio for lipid management and continues to be on rosuvastatin 40 mg daily.  Apparently there was a period where he stopped a lot of his medications including beta-blocker therapy.  Presently he is on amlodipine 10 mg, olmesartan 40 mg, rosuvastatin 40 mg in addition to Leqvio every 41-monthinjections.  He is on Lexapro for depression.  He continues to use CPAP.  Blood was obtained today from April 19 through May 18 which shows excellent compliance with average use at 7 hours and 23 minutes per night.  CPAP is set at a pressure range of 10 to 20 cm and his AHI is 2.8.  He has had significant delta 3 with his most recent mask.  He is previous Respironics fullface mask was called back due to magnets and he now has been using a fullface mask with the tubing comes to the front which he does not like.  He  had undergone some laboratory in February 2023.  Total cholesterol was 67 HDL 42 LDL had dropped to 6 with triglycerides still elevated at 177.  He has not had recent thyroid studies or renal function ALT was increased at 68.  Denies any chest pain.  He typically goes to bed 9:30-10 and wakes up at 4 AM to get his children ready for school.  Also is going to school himself working on a nursing degree.  During that evaluation, I provided him with a new ResMed air fit F30i mask since his prior mask have been recalled.  During that evaluation evaluation he had stopped his beta-blocker therapy and his resting pulse was in  the 90s to 100 range.  I recommended the addition of metoprolol 50 mg to take in addition to his olmesartan 40 mg and amlodipine.  Since I saw him, he denies recurrent anginal symptoms.  Recently he has noticed a metallic taste in his mouth which he attributes secondary to olmesartan.  His blood pressure has continued to be somewhat labile and he has been taking amlodipine 10 mg, metoprolol succinate 50 mg, in addition to the olmesartan.  However when he did not take it olmesartan first several days metallic taste resolved.  It did improve somewhat when he tried taking it at night but again with resumption of treatment he noted a metallic taste.  He continues to be on inclisiran injection and has received 3 doses.  Laboratory in July showed total cholesterol 122 HDL 32 LDL 28 but triglycerides remain elevated at 443.  He was recently started on a prescription of Vascepa 2 capsules twice a day.  He is scheduled for his next glycerin dose in January.  He continues to use CPAP.  His machine is from 2017 and he qualifies for new machine.  On his current pressure range of 10 to 20 cm, AHI is 3.4 and he is compliant with average use just under 8 hours per night.  He does have substantial mask leak.  He presents for reevaluation.  Past Medical History:  Diagnosis Date   Cervical radiculopathy at C5  01/27/2016   left   Coronary artery disease 08/2015   Depression    GERD (gastroesophageal reflux disease)    History of kidney stones    Hyperlipidemia LDL goal <70    Hypertension    Hypertriglyceridemia    Left arm weakness    Myocardial infarction ([place]) 08/2015   OCD (obsessive compulsive disorder)    Sleep apnea     Past Surgical History:  Procedure Laterality Date   ANTERIOR CERVICAL DECOMP/DISCECTOMY FUSION N/A 05/01/2017   Procedure: Anterior Cervical Decompression Fusion Cervical Four-Five Cervical Five-Six;  Surgeon: Jovita Gamma, MD;  Location: [place];  Service: Neurosurgery;  Laterality: N/A;  Anterior Cervical Decompression Fusion Cervical Four-Five Cervical Five-Six   CARDIAC CATHETERIZATION N/A 09/15/2015   Procedure: Left Heart Cath and Coronary Angiography;  Surgeon: Troy Sine, MD; LAD 30%, pRCA 30%, dRCA 30%, RPDA 99%, nl LV function    CARDIAC CATHETERIZATION N/A 09/15/2015   Procedure: Coronary Stent Intervention;  Surgeon: Troy Sine, MD; 3.515 mm Xience Alpine DES to dRCA/RPDA>> 5% residual      LEFT HEART CATH AND CORONARY ANGIOGRAPHY N/A 11/28/2016   Procedure: Left Heart Cath and Coronary Angiography;  Surgeon: Troy Sine, MD;  Location: [place] [facility];  Service: Cardiovascular;  Laterality: N/A;   TONSILLECTOMY      Allergies  Allergen Reactions   Amoxicillin Rash   Penicillins Rash and Other (See Comments)    Has patient had a PCN reaction causing immediate rash, facial/tongue/throat swelling, SOB or lightheadedness with hypotension: Yes Has patient had a PCN reaction causing severe rash involving mucus membranes or skin necrosis: No Has patient had a PCN reaction that required hospitalization No Has patient had a PCN reaction occurring within the last 10 years: No If all of the above answers are "NO", then may proceed with Cephalosporin use.     Current Outpatient Medications  Medication Sig Dispense Refill   amLODipine (NORVASC) 10  MG tablet Take 1 tablet by mouth daily.     aspirin EC 81 MG EC tablet Take 1  tablet (81 mg total) by mouth daily. (Patient taking differently: Take 81 mg by mouth every evening.)     diclofenac (VOLTAREN) 75 MG EC tablet TAKE 1 TABLET BY MOUTH TWICE A DAY 50 tablet 2   icosapent Ethyl (VASCEPA) 1 g capsule Take 2 capsules (2 g total) by mouth 2 (two) times daily. 360 capsule 3   inclisiran (LEQVIO) 284 MG/1.5ML SOSY injection as directed Subcutaneous every 6 months     metoprolol succinate (TOPROL-XL) 50 MG 24 hr tablet Take 1 tablet (50 mg total) by mouth daily. Take with or immediately following a meal. 90 tablet 3   Multiple Vitamin (MULTIVITAMIN WITH MINERALS) TABS tablet Take 1 tablet by mouth daily.     nitroGLYCERIN (NITROSTAT) 0.4 MG SL tablet Place 1 tablet (0.4 mg total) under the tongue every 5 (five) minutes as needed for chest pain (CP or SOB). 25 tablet 12   OZEMPIC, 0.25 OR 0.5 MG/DOSE, 2 MG/3ML SOPN Inject 0.38m x 4 weeks after doing 0.279mdosing x 4 weeks as directed Subcutaneous as directed for 30 days     rosuvastatin (CRESTOR) 20 MG tablet Take 1 tablet (20 mg total) by mouth daily. 90 tablet 3   valsartan (DIOVAN) 160 MG tablet Take 1 tablet (160 mg total) by mouth daily. 90 tablet 3   No current facility-administered medications for this visit.    Social History   Socioeconomic History   Marital status: Legally Separated    Spouse name: Not on file   Number of children: 3   Years of education: Some Coll   Highest education level: Not on file  Occupational History   Occupation: [employer]  Tobacco Use   Smoking status: Never   Smokeless tobacco: Never  Vaping Use   Vaping Use: Never used  Substance and Sexual Activity   Alcohol use: Yes    Alcohol/week: 0.0 standard drinks of alcohol    Comment: Rarely   Drug use: No   Sexual activity: Yes  Other Topics Concern   Not on file  Social History Narrative   Multiple family  members on his father's side have/had CAD at a young age, uncles and grandparents.   Lives at home with his wife and three children.   Right-handed.   2-3 cups caffeine per week.   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  When I saw him in 2018 he and his wife had separated.  He has 3 children.  His 124ear old son was diagnosed with Crohn's disease which is started on Remicade.  Family History  Problem Relation Age of Onset   Heart attack Father 4025     approx age of first stent   Stroke Father    Hypertension Father    Skin  cancer Mother    Hyperlipidemia Sister    Stroke Paternal Grandmother    Hypertension Paternal Grandmother    Hypertension Maternal Grandmother    Hypertension Maternal Grandfather    Diabetes Maternal Grandfather    Hypertension Paternal Grandfather    Diabetes Paternal Grandfather    Heart disease Paternal Grandfather     ROS General: Negative; No fevers, chills, or night sweats HEENT: Negative; No changes in vision or hearing, sinus congestion, difficulty swallowing Pulmonary: Negative; No cough, wheezing, shortness of breath, hemoptysis Cardiovascular: See HPI: No chest pain, presyncope, syncope, palpatations GI: Negative; No nausea, vomiting, diarrhea, or abdominal pain GU: Negative; No dysuria, hematuria, or difficulty  voiding Musculoskeletal: He is in need for cervical C3-6 surgery.   Hematologic: Negative; no easy bruising, bleeding Endocrine: Negative; no heat/cold intolerance; no diabetes, Neuro: Negative; no changes in balance, headaches Skin: Negative; No rashes or skin lesions Psychiatric: Negative; No behavioral problems, depression Sleep: Positive for snoring, and occasional daytime sleepiness, Now on CPAP therapy with resolution of symptoms; no bruxism, restless legs, hypnogognic hallucinations. Other comprehensive 14 point system review is negative   Physical Exam BP (!) 152/100   Pulse 90   Ht _0  (1.753 m)   Wt 249 lb 3.2 oz (113 kg)   SpO2 97%   BMI 36.80 kg/m    Repeat blood pressure by me was improved to 136/84.  Wt Readings from Last 3 Encounters:  07/03/22 249 lb 3.2 oz (113 kg)  01/08/22 248 lb 12.8 oz (112.9 kg)  07/27/20 245 lb (111.1 kg)   When I saw him in 2018 he weighed 221. He had purposeful weight loss and his  weight had dropped to 190 pounds in 2019.   Weight today is unchanged from his prior weight of 248 and is 249 pounds.   General: Alert, oriented, no distress.  Skin: normal turgor, no rashes, warm and dry HEENT: Normocephalic, atraumatic. Pupils equal round and reactive to light; sclera anicteric; extraocular muscles intact;  Nose without nasal septal hypertrophy Mouth/Parynx benign; Mallinpatti scale 3 Neck: No JVD, no carotid bruits; normal carotid upstroke Lungs: clear to ausculatation and percussion; no wheezing or rales Chest wall: without tenderness to palpitation Heart: PMI not displaced, RRR, s1 s2 normal, 1/6 systolic murmur, no diastolic murmur, no rubs, gallops, thrills, or heaves Abdomen: soft, nontender; no hepatosplenomehaly, BS+; abdominal aorta nontender and not dilated by palpation. Back: no CVA tenderness Pulses 2+ Musculoskeletal: full range of motion, normal strength, no joint deformities Extremities: no clubbing cyanosis or edema,  Homan's sign negative  Neurologic: grossly nonfocal; Cranial nerves grossly wnl Psychologic: Normal mood and affect  July 03, 2022 ECG (independently read by me): Normal sinus rhythm at 90 bpm.  No ectopy.  Normal intervals.  No ST segment changes or evidence of prior infarction  Jan 08, 2022 ECG (independently read by me): Normal sinus rhythm at 96 bpm.  Right axis.  Poor R wave progression.  July 27, 2020 ECG (independently read by me): Normal sinus rhythm at 79 bpm.  T wave inversion in lead III.  No ectopy.  Normal intervals.  November 20, 2016  ECG (independently read by me): Normal sinus rhythm at 63 bpm.  Mild T wave inversion in III.  January 2018 ECG (independently read by me): Normal sinus rhythm at 78 bpm.  PR interval 142 ms, QTc interval 435 ms.  No ECG evidence for his prior myocardial infarction.  October 2017 ECG (independently read by me): Normal sinus rhythm at  91 bpm.  No ectopy.  Normal intervals.  May 2017 ECG (independently read by me): Normal sinus rhythm at 73 bpm.  Small nondiagnostic inferior Q waves with T-wave inversion.  LABS:     Latest Ref Rng & Units 02/21/2022   12:13 PM 05/01/2017    7:08 AM 11/28/2016    7:30 AM  BMP  Glucose 70 - 99 mg/dL 90  116    BUN 6 - 24 mg/dL 12  11    Creatinine 0.76 - 1.27 mg/dL 0.73  0.75    BUN/Creat Ratio 9 - 20 16     Sodium 134 - 144 mmol/L 140  138    Potassium 3.5 - 5.2 mmol/L 4.8  4.3  4.0   Chloride 96 - 106 mmol/L 102  109    CO2 20 - 29 mmol/L 24  21    Calcium 8.7 - 10.2 mg/dL 9.4  8.8          Latest Ref Rng & Units 02/21/2022   12:13 PM 09/15/2015    4:43 AM 03/08/2014    9:24 PM  Hepatic Function  Total Protein 6.0 - 8.5 g/dL 6.6  6.4  7.1   Albumin 4.0 - 5.0 g/dL 4.5  4.0  4.5   AST 0 - 40 IU/L 53  36  31   ALT 0 - 44 IU/L 56  54  53   Alk Phosphatase 44 - 121 IU/L 94  66  84   Total Bilirubin 0.0 - 1.2 mg/dL 0.3  0.8  0.3        Latest Ref Rng & Units 02/21/2022   12:13 PM 04/30/2017    8:42  AM 11/26/2016    4:54 PM  CBC  WBC 3.4 - 10.8 x10E3/uL 6.5  6.3  8.0   Hemoglobin 13.0 - 17.7 g/dL 15.3  16.2  14.8   Hematocrit 37.5 - 51.0 % 45.7  48.8  44.2   Platelets 150 - 450 x10E3/uL 230  231  238    Lab Results  Component Value Date   MCV 81 02/21/2022   MCV 83.1 04/30/2017   MCV 81.7 11/26/2016    Lab Results  Component Value Date   TSH 4.560 (H) 02/21/2022    BNP No results found for: "BNP"  ProBNP No results found for: "PROBNP"   Lipid Panel     Component Value Date/Time   CHOL 122 02/21/2022 1213   TRIG 443 (H) 02/21/2022 1213   HDL 32 (L) 02/21/2022 1213   CHOLHDL 3.8 02/21/2022 1213   CHOLHDL 6.8 09/16/2015 0335   VLDL UNABLE TO CALCULATE IF TRIGLYCERIDE OVER 400 mg/dL 09/16/2015 0335   LDLCALC 28 02/21/2022 1213     RADIOLOGY: No results found.  IMPRESSION:  1. Essential hypertension   2. History of MI (myocardial infarction): PCI DES stent to RCA 09/15/2015   3. OSA (obstructive sleep apnea) on CPAP   4. Mixed hyperlipidemia   5. Hyperlipidemia with target LDL less than 70   6. Obesity, Class II, BMI 35-39.9      ASSESSMENT AND PLAN: Mr. Aryaan Persichetti is a 46 year old male who had a history of previously untreated hyperlipidemia, as well as hypertension.  He was awakened from sleep on the morning of 09/15/2015 with chest pain and was found to have inferior ST elevation.  He underwent successful emergent catheterization and intervention to an ulcerated plaque in the RCA proximal to the PDA takeoff and there was evidence for distal embolization of clot  to the PDA.  He underwent successful stenting at the site of the ulcerated plaque and PTCA in the distal PDA.  Since his MI awakened him from sleep I was concerned about sleep apnea which may have been contributory to his symptomatology.  This may have contributed to increased inflammation with ultimate plaque rupture during potential hypoxemia.  He was found to have severe obstructive sleep apnea  with an AHI of 46.8 per hour.   CPAP therapy was initiated and he is continue to use CPAP excellent compliance and does not sleep without it.  A downloadfrom November 7 through July 25, 2020 shows 100% compliance with average use at 7 hours.  He is CPAP auto is set at a minimum pressure of 10 and maximum of 20 and his 95th percentile pressure is 12.7 with a maximum average pressure at 14.1.  AHI is 3.4.  He continues to note the benefits of CPAP with being more alert, no longer snoring, marked improvement in previous significant nocturia and lack of daytime sleepiness.  At his last evaluation with me in May 2023 I provided him with a new ResMed air fit F30i mask which she has been using and he believes does well.  However his download today confirms he still having mask leak.  A download from October 15 through July 02, 2022 continues to show excellent compliance.  At his pressure range of 10 to 20 cm of water, AHI is 3.4.  I will slightly increase his pressure range to 11 to 20 cm of water but I suspect the majority of his residual AHI may be contributed by his mask leak.  He qualifies for a new machine and since he has met all his insurance requirements he would like to proceed and obtain a new machine presently.  I will place an order for him to have a ResMed air sense 11 AutoSet unit.  He has had difficulty with olmesartan stating that this is contributed to the metallic taste in his mouth.  Remotely had tolerated valsartan.  His resting pulse also is in the 90s despite taking metoprolol 50 mg and he has had blood pressure lability.  I am recommending titration of metoprolol succinate to 75 mg for the next week and if his resting pulse is still in the mid 70s or above he will increase this to 100 mg daily.  He has continued to be on amlodipine 10 mg in addition to the olmesartan for blood pressure control.  Since he believes he tolerated valsartan in the past, I will reinitiate valsartan at 160 mg dosing.   If blood pressure remains elevated this will be further titrated to 320 mg.  He is on inclisiran and has received 3 injections with his next injection due in January 2024.  Lipid studies in July 2023 showed excellent LDL cholesterol at 28 but triglycerides remain elevated at 443.  He was recently started on a prescription of Vascepa and I recommended 2 capsules twice a day.  He continues to be on concomitant rosuvastatin 20 mg.  I will see him within 90 days following him obtaining a new CPAP ResMed AirSense 11 AutoSet unit.  He will monitor his blood pressure and heart rate.  He will be seeing [name] in February and laboratory will be obtained.     Troy Sine, MD, [facility]  07/03/2022 11:51 AM

## 2022-07-03 NOTE — Telephone Encounter (Signed)
Order for replacement CPAP machine faxed to [facility] @ [phone].

## 2022-07-04 ENCOUNTER — Ambulatory Visit: Payer: Self-pay

## 2022-07-04 NOTE — Patient Outreach (Signed)
  Care Coordination   07/04/2022 Name: Richard Hull MRN: [id number] DOB: 11/13/1975   Care Coordination Outreach Attempts:  An unsuccessful telephone outreach was attempted for a scheduled appointment today.  Follow Up Plan:  Additional outreach attempts will be made to offer the patient care coordination information and services.   Encounter Outcome:  No Answer  Care Coordination Interventions Activated:  No   Care Coordination Interventions:  No, not indicated    Delsa Sale, RN, BSN, CCM Care Management Coordinator [facility] Care Management  Direct Phone: [phone]

## 2022-07-10 DIAGNOSIS — Z1211 Encounter for screening for malignant neoplasm of colon: Secondary | ICD-10-CM | POA: Diagnosis not present

## 2022-07-10 DIAGNOSIS — Z01818 Encounter for other preprocedural examination: Secondary | ICD-10-CM | POA: Diagnosis not present

## 2022-07-17 DIAGNOSIS — Z23 Encounter for immunization: Secondary | ICD-10-CM | POA: Diagnosis not present

## 2022-07-19 ENCOUNTER — Telehealth: Payer: Self-pay | Admitting: Podiatry

## 2022-07-19 NOTE — Telephone Encounter (Signed)
Left message on vm to call back to schedule picking up orthotics

## 2022-07-21 DIAGNOSIS — G4733 Obstructive sleep apnea (adult) (pediatric): Secondary | ICD-10-CM | POA: Diagnosis not present

## 2022-07-24 ENCOUNTER — Ambulatory Visit (INDEPENDENT_AMBULATORY_CARE_PROVIDER_SITE_OTHER): Payer: BC Managed Care – PPO | Admitting: Podiatry

## 2022-07-24 ENCOUNTER — Encounter: Payer: Self-pay | Admitting: Podiatry

## 2022-07-24 VITALS — BP 149/84 | HR 89

## 2022-07-24 DIAGNOSIS — M722 Plantar fascial fibromatosis: Secondary | ICD-10-CM

## 2022-07-24 DIAGNOSIS — G4733 Obstructive sleep apnea (adult) (pediatric): Secondary | ICD-10-CM | POA: Diagnosis not present

## 2022-07-24 NOTE — Progress Notes (Signed)
   Patient presented this morning for orthotics pickup.  After fitting with the orthotics they were an improper fit to the patient's foot.  The orthotics need to be shorter as well as wider.  Patient's forefoot pain over the edges of the orthotics. Will send orthotics back to our orthotics department for adjustment.  Contact patient when orthotics are ready.   Felecia Shelling, DPM [facility]  Dr. Felecia Shelling, DPM    [address] [place], [place] [address]                Office [phone]  Fax [phone]

## 2022-07-24 NOTE — Patient Instructions (Signed)

## 2022-07-27 ENCOUNTER — Telehealth: Payer: Self-pay | Admitting: Pharmacist

## 2022-07-27 NOTE — Progress Notes (Signed)
[facility] Customer service manager [facility])                                            [facility] Quality Pharmacy Team    07/27/2022  Richard Hull 1976/04/10 [id number]  I was contacted by telephone by Mr. Richard Hull regarding his reimbursement for [employer]. Advised patient that I would follow up with [employer] for [employer]. Per access specialist, patient can call the service center at [phone] and speak with [facility] at [phone]. [phone].   Placed call back to patient and relayed information. Patient was appreciative.    Reynold Bowen, PharmD [facility]  [facility] Clinical Pharmacist Office: [phone]

## 2022-07-30 ENCOUNTER — Other Ambulatory Visit (HOSPITAL_COMMUNITY): Payer: Self-pay | Admitting: Cardiology

## 2022-08-01 ENCOUNTER — Telehealth: Payer: Self-pay | Admitting: *Deleted

## 2022-08-01 NOTE — Progress Notes (Signed)
  Care Coordination Note  08/01/2022 Name: Richard Hull MRN: [id number] DOB: 1976/05/16  Richard Hull is a 46 y.o. year old male who is a primary care patient of [name], MD and is actively engaged with the care management team. I reached out to [name] by phone today to assist with re-scheduling a follow up visit with the RN Case Manager  Follow up plan: Unsuccessful telephone outreach attempt made. A HIPAA compliant phone message was left for the patient providing contact information and requesting a return call.   [facility]  Care Coordination Care Guide  Direct Dial: [phone]

## 2022-08-15 DIAGNOSIS — Z1211 Encounter for screening for malignant neoplasm of colon: Secondary | ICD-10-CM | POA: Diagnosis not present

## 2022-08-21 NOTE — Progress Notes (Signed)
  Care Coordination Note  08/21/2022 Name: [name] MRN: [id number] DOB: 15-Aug-1976  Richard Hull is a 47 y.o. year old male who is a primary care patient of Shon Baton, MD and is actively engaged with the care management team. I reached out to [name] by phone today to assist with re-scheduling a follow up visit with the RN Case Manager  Follow up plan: Telephone appointment with care management team member scheduled for:08/31/22  [place]: [phone]

## 2022-08-23 ENCOUNTER — Telehealth: Payer: Self-pay | Admitting: Pharmacist

## 2022-08-23 NOTE — Progress Notes (Signed)
[facility]) [place]   08/23/2022  Richard Hull 1976-07-11 [id number]  Reason for referral:  [facility] Access Follow Up  Referral source:  Patient  Current insurance: [facility] [employer]  Reason for call: Returning call to patient.   Outreach:  Unsuccessful telephone call attempt #1 to patient.   HIPAA compliant voicemail left requesting a return call  Plan:  -I will make another outreach attempt to patient within 3-4 business days.  Thank you for allowing [facility] pharmacy to be a part of this patient's care.   Loretha Brasil, PharmD [place] Pharmacist Office: [phone]

## 2022-08-24 DIAGNOSIS — G4733 Obstructive sleep apnea (adult) (pediatric): Secondary | ICD-10-CM | POA: Diagnosis not present

## 2022-08-27 ENCOUNTER — Inpatient Hospital Stay (HOSPITAL_COMMUNITY): Admission: RE | Admit: 2022-08-27 | Payer: BC Managed Care – PPO | Source: Ambulatory Visit

## 2022-08-31 ENCOUNTER — Ambulatory Visit: Payer: Self-pay

## 2022-08-31 NOTE — Patient Outreach (Signed)
  Care Coordination   Follow Up Visit Note   08/31/2022 Name: CORDARRO SPINNATO MRN: [id number] DOB: [birth year]  GABRIAN HOQUE is a 47 y.o. year old male who sees Shon Baton, MD for primary care. I spoke with  [name] by phone today.  What matters to the patients health and wellness today?  Patient will continue to manage his Cholesterol as directed.     Goals Addressed               This Visit's Progress     Patient Stated     COMPLETED: I need help getting my cholesterol medication (pt-stated)        Care Coordination Interventions: Provider established cholesterol goals reviewed Reviewed medications with patient and discussed importance of medication adherence Reviewed importance of limiting foods high in cholesterol Mailed printed educational materials related to dietary recommendations for Cholesterol management  Lipid Panel     Component Value Date/Time   CHOL 122 02/21/2022 1213   TRIG 443 (H) 02/21/2022 1213   HDL 32 (L) 02/21/2022 1213   CHOLHDL 3.8 02/21/2022 1213   CHOLHDL 6.8 09/16/2015 0335   VLDL UNABLE TO CALCULATE IF TRIGLYCERIDE OVER 400 mg/dL 09/16/2015 0335   LDLCALC 28 02/21/2022 1213   LABVLDL 62 (H) 02/21/2022 1213                SDOH assessments and interventions completed:  No     Care Coordination Interventions:  Yes, provided   Follow up plan: No further intervention required.   Encounter Outcome:  Pt. Visit Completed

## 2022-08-31 NOTE — Patient Instructions (Signed)
Visit Information  Thank you for taking time to visit with me today. Please don't hesitate to contact me if I can be of assistance to you.   Following are the goals we discussed today:   Goals Addressed               This Visit's Progress     Patient Stated     COMPLETED: I need help getting my cholesterol medication (pt-stated)        Care Coordination Interventions: Provider established cholesterol goals reviewed Reviewed medications with patient and discussed importance of medication adherence Reviewed importance of limiting foods high in cholesterol Mailed printed educational materials related to dietary recommendations for Cholesterol management  Lipid Panel     Component Value Date/Time   CHOL 122 02/21/2022 1213   TRIG 443 (H) 02/21/2022 1213   HDL 32 (L) 02/21/2022 1213   CHOLHDL 3.8 02/21/2022 1213   CHOLHDL 6.8 09/16/2015 0335   VLDL UNABLE TO CALCULATE IF TRIGLYCERIDE OVER 400 mg/dL 09/16/2015 0335   LDLCALC 28 02/21/2022 1213   LABVLDL 62 (H) 02/21/2022 1213               If you are experiencing a Mental Health or [place] or need someone to talk to, please go to [facility] [address] [place] [phone])  Patient verbalizes understanding of instructions and care plan provided today and agrees to view in [place]. Active MyChart status and patient understanding of how to access instructions and care plan via MyChart confirmed with patient.     Barb Merino, RN, BSN, CCM Care Management Coordinator [facility] Care Management Direct Phone: [phone]

## 2022-09-03 ENCOUNTER — Other Ambulatory Visit: Payer: Self-pay

## 2022-09-03 ENCOUNTER — Emergency Department (HOSPITAL_COMMUNITY): Payer: BC Managed Care – PPO

## 2022-09-03 ENCOUNTER — Observation Stay (HOSPITAL_COMMUNITY)
Admission: EM | Admit: 2022-09-03 | Discharge: 2022-09-05 | Disposition: A | Discharge status: Home / Self Care | Payer: BC Managed Care – PPO | Attending: Family Medicine | Admitting: Family Medicine

## 2022-09-03 ENCOUNTER — Encounter (HOSPITAL_COMMUNITY): Payer: Self-pay | Admitting: Emergency Medicine

## 2022-09-03 DIAGNOSIS — Z955 Presence of coronary angioplasty implant and graft: Secondary | ICD-10-CM | POA: Diagnosis not present

## 2022-09-03 DIAGNOSIS — R000 Tachycardia, unspecified: Secondary | ICD-10-CM | POA: Diagnosis not present

## 2022-09-03 DIAGNOSIS — I2510 Atherosclerotic heart disease of native coronary artery without angina pectoris: Secondary | ICD-10-CM | POA: Insufficient documentation

## 2022-09-03 DIAGNOSIS — R7989 Other specified abnormal findings of blood chemistry: Secondary | ICD-10-CM | POA: Insufficient documentation

## 2022-09-03 DIAGNOSIS — Z79899 Other long term (current) drug therapy: Secondary | ICD-10-CM | POA: Insufficient documentation

## 2022-09-03 DIAGNOSIS — G4733 Obstructive sleep apnea (adult) (pediatric): Secondary | ICD-10-CM | POA: Diagnosis present

## 2022-09-03 DIAGNOSIS — I1A0 Resistant hypertension: Secondary | ICD-10-CM | POA: Diagnosis present

## 2022-09-03 DIAGNOSIS — I10 Essential (primary) hypertension: Secondary | ICD-10-CM | POA: Diagnosis not present

## 2022-09-03 DIAGNOSIS — R1084 Generalized abdominal pain: Secondary | ICD-10-CM | POA: Diagnosis not present

## 2022-09-03 DIAGNOSIS — K760 Fatty (change of) liver, not elsewhere classified: Secondary | ICD-10-CM | POA: Diagnosis not present

## 2022-09-03 DIAGNOSIS — K529 Noninfective gastroenteritis and colitis, unspecified: Secondary | ICD-10-CM | POA: Diagnosis present

## 2022-09-03 DIAGNOSIS — N179 Acute kidney failure, unspecified: Secondary | ICD-10-CM | POA: Diagnosis not present

## 2022-09-03 DIAGNOSIS — Z7982 Long term (current) use of aspirin: Secondary | ICD-10-CM | POA: Insufficient documentation

## 2022-09-03 DIAGNOSIS — R109 Unspecified abdominal pain: Secondary | ICD-10-CM | POA: Diagnosis not present

## 2022-09-03 LAB — URINALYSIS, ROUTINE W REFLEX MICROSCOPIC
Bilirubin Urine: NEGATIVE
Glucose, UA: NEGATIVE mg/dL
Hgb urine dipstick: NEGATIVE
Ketones, ur: NEGATIVE mg/dL
Leukocytes,Ua: NEGATIVE
Nitrite: NEGATIVE
Protein, ur: 30 mg/dL — AB
Specific Gravity, Urine: 1.026 (ref 1.005–1.030)
pH: 5 (ref 5.0–8.0)

## 2022-09-03 LAB — CBC
HCT: 59.3 % — ABNORMAL HIGH (ref 39.0–52.0)
Hemoglobin: 19.9 g/dL — ABNORMAL HIGH (ref 13.0–17.0)
MCH: 26.9 pg (ref 26.0–34.0)
MCHC: 33.6 g/dL (ref 30.0–36.0)
MCV: 80.2 fL (ref 80.0–100.0)
Platelets: 356 10*3/uL (ref 150–400)
RBC: 7.39 MIL/uL — ABNORMAL HIGH (ref 4.22–5.81)
RDW: 14.9 % (ref 11.5–15.5)
WBC: 9.5 10*3/uL (ref 4.0–10.5)
nRBC: 0 % (ref 0.0–0.2)

## 2022-09-03 LAB — LACTIC ACID, PLASMA
Lactic Acid, Venous: 2.4 mmol/L (ref 0.5–1.9)
Lactic Acid, Venous: 2.6 mmol/L (ref 0.5–1.9)

## 2022-09-03 LAB — LIPASE, BLOOD: Lipase: 45 U/L (ref 11–51)

## 2022-09-03 LAB — COMPREHENSIVE METABOLIC PANEL
ALT: 87 U/L — ABNORMAL HIGH (ref 0–44)
AST: 68 U/L — ABNORMAL HIGH (ref 15–41)
Albumin: 5 g/dL (ref 3.5–5.0)
Alkaline Phosphatase: 96 U/L (ref 38–126)
Anion gap: 15 (ref 5–15)
BUN: 47 mg/dL — ABNORMAL HIGH (ref 6–20)
CO2: 16 mmol/L — ABNORMAL LOW (ref 22–32)
Calcium: 9.6 mg/dL (ref 8.9–10.3)
Chloride: 101 mmol/L (ref 98–111)
Creatinine, Ser: 1.6 mg/dL — ABNORMAL HIGH (ref 0.61–1.24)
GFR, Estimated: 54 mL/min — ABNORMAL LOW (ref >60)
Glucose, Bld: 160 mg/dL — ABNORMAL HIGH (ref 70–99)
Potassium: 3.8 mmol/L (ref 3.5–5.1)
Sodium: 132 mmol/L — ABNORMAL LOW (ref 135–145)
Total Bilirubin: 0.9 mg/dL (ref 0.3–1.2)
Total Protein: 8.5 g/dL — ABNORMAL HIGH (ref 6.5–8.1)

## 2022-09-03 LAB — MAGNESIUM: Magnesium: 2.7 mg/dL — ABNORMAL HIGH (ref 1.7–2.4)

## 2022-09-03 MED ORDER — ONDANSETRON HCL 4 MG/2ML IJ SOLN
4.0000 mg | Freq: Once | INTRAMUSCULAR | Status: AC
  Administered 2022-09-03: 4 mg via INTRAVENOUS
  Filled 2022-09-03: qty 2

## 2022-09-03 MED ORDER — MORPHINE SULFATE (PF) 4 MG/ML IV SOLN: 4.0000 mg | INTRAVENOUS | Status: DC | PRN

## 2022-09-03 MED ORDER — AMLODIPINE BESYLATE 5 MG PO TABS
10.0000 mg | ORAL_TABLET | Freq: Every day | ORAL | Status: DC
  Administered 2022-09-04 – 2022-09-05 (×2): 10 mg via ORAL
  Filled 2022-09-03 (×2): qty 2

## 2022-09-03 MED ORDER — IOHEXOL 300 MG/ML  SOLN
100.0000 mL | Freq: Once | INTRAMUSCULAR | Status: AC | PRN
  Administered 2022-09-03: 100 mL via INTRAVENOUS

## 2022-09-03 MED ORDER — ACETAMINOPHEN 325 MG PO TABS
650.0000 mg | ORAL_TABLET | Freq: Four times a day (QID) | ORAL | Status: DC | PRN
  Administered 2022-09-04: 650 mg via ORAL
  Filled 2022-09-03: qty 2

## 2022-09-03 MED ORDER — HEPARIN SODIUM (PORCINE) 5000 UNIT/ML IJ SOLN
5000.0000 [IU] | Freq: Three times a day (TID) | INTRAMUSCULAR | Status: DC
  Administered 2022-09-04 – 2022-09-05 (×4): 5000 [IU] via SUBCUTANEOUS
  Filled 2022-09-03 (×4): qty 1

## 2022-09-03 MED ORDER — ONDANSETRON HCL 4 MG/2ML IJ SOLN: 4.0000 mg | Freq: Four times a day (QID) | INTRAMUSCULAR | Status: DC | PRN

## 2022-09-03 MED ORDER — SODIUM CHLORIDE 0.9 % IV SOLN: INTRAVENOUS | Status: DC

## 2022-09-03 MED ORDER — SODIUM CHLORIDE 0.9 % IV BOLUS
1000.0000 mL | Freq: Once | INTRAVENOUS | Status: AC
  Administered 2022-09-03: 1000 mL via INTRAVENOUS

## 2022-09-03 MED ORDER — METOPROLOL SUCCINATE ER 50 MG PO TB24
50.0000 mg | ORAL_TABLET | Freq: Every day | ORAL | Status: DC
  Administered 2022-09-04 – 2022-09-05 (×2): 50 mg via ORAL
  Filled 2022-09-03 (×2): qty 1

## 2022-09-03 MED ORDER — ACETAMINOPHEN 650 MG RE SUPP: 650.0000 mg | Freq: Four times a day (QID) | RECTAL | Status: DC | PRN

## 2022-09-03 MED ORDER — ASPIRIN 81 MG PO TBEC
81.0000 mg | DELAYED_RELEASE_TABLET | Freq: Every evening | ORAL | Status: DC
  Administered 2022-09-04: 81 mg via ORAL
  Filled 2022-09-03: qty 1

## 2022-09-03 MED ORDER — MORPHINE SULFATE (PF) 4 MG/ML IV SOLN
4.0000 mg | Freq: Once | INTRAVENOUS | Status: AC
  Administered 2022-09-03: 4 mg via INTRAVENOUS
  Filled 2022-09-03: qty 1

## 2022-09-03 MED ORDER — ONDANSETRON HCL 4 MG PO TABS: 4.0000 mg | ORAL_TABLET | Freq: Four times a day (QID) | ORAL | Status: DC | PRN

## 2022-09-03 NOTE — H&P (Addendum)
History and Physical    Richard Hull IRC:[id number] DOB: February 17, 1976 DOA: 09/03/2022  PCP: [name], MD   Patient coming from: Home   I have personally briefly reviewed patient's old medical records in [facility]  Chief Complaint: Abdominal Pain  HPI: Richard Hull is a 47 y.o. male with medical history significant for  CAD, HTN, OSA on CPAP. Patient presented to ED with complaints of vomiting and diarrhea that started 4 days ago- 1/11, symptoms worsened the next day Friday with multiple episodes of watery stools and vomiting.  Reports frequency and intensity of symptoms have improved, but today he has had 10 episodes of small-volume diarrhea.  1 episode of vomiting today.  Poor oral intake over the past several days. He came to the ED today due to severe excruciating abdominal pain. No sick contacts.  Denies respiratory symptoms.  ED Course: Temperature 99.2.  Heart rate 102-122.  Respirate rate 15-18.  Blood pressure systolic 1 58-165. Creatinine elevated 1.6.  Lactic acidosis 2.4.  UA not suggestive of UTI. Abdominal CT with contrast shows diffuse colonic gas, fluid levels consistent with history of diarrhea. 2 L bolus given.  4mg  IV Morphine given.  Review of Systems: As per HPI all other systems reviewed and negative.  Past Medical History:  Diagnosis Date   Cervical radiculopathy at C5 01/27/2016   left   Coronary artery disease 08/2015   Depression    GERD (gastroesophageal reflux disease)    History of kidney stones    Hyperlipidemia LDL goal <70    Hypertension    Hypertriglyceridemia    Left arm weakness    Myocardial infarction (HCC) 08/2015   OCD (obsessive compulsive disorder)    Sleep apnea     Past Surgical History:  Procedure Laterality Date   ANTERIOR CERVICAL DECOMP/DISCECTOMY FUSION N/A 05/01/2017   Procedure: Anterior Cervical Decompression Fusion Cervical Four-Five Cervical Five-Six;  Surgeon: 07/01/2017, MD;  Location: [facility] OR;  Service:  Neurosurgery;  Laterality: N/A;  Anterior Cervical Decompression Fusion Cervical Four-Five Cervical Five-Six   CARDIAC CATHETERIZATION N/A 09/15/2015   Procedure: Left Heart Cath and Coronary Angiography;  Surgeon: 09/17/2015, MD; LAD 30%, pRCA 30%, dRCA 30%, RPDA 99%, nl LV function    CARDIAC CATHETERIZATION N/A 09/15/2015   Procedure: Coronary Stent Intervention;  Surgeon: 09/17/2015, MD; 3.515 mm Xience Alpine DES to dRCA/RPDA>> 5% residual      LEFT HEART CATH AND CORONARY ANGIOGRAPHY N/A 11/28/2016   Procedure: Left Heart Cath and Coronary Angiography;  Surgeon: 01/28/2017, MD;  Location: [facility] INVASIVE CV LAB;  Service: Cardiovascular;  Laterality: N/A;   TONSILLECTOMY       reports that he has never smoked. He has never used smokeless tobacco. He reports current alcohol use. He reports that he does not use drugs.  Allergies  Allergen Reactions   Amoxicillin Rash   Penicillins Rash and Other (See Comments)    Has patient had a PCN reaction causing immediate rash, facial/tongue/throat swelling, SOB or lightheadedness with hypotension: Yes Has patient had a PCN reaction causing severe rash involving mucus membranes or skin necrosis: No Has patient had a PCN reaction that required hospitalization No Has patient had a PCN reaction occurring within the last 10 years: No If all of the above answers are "NO", then may proceed with Cephalosporin use.     Family History  Problem Relation Age of Onset   Heart attack Father 31  approx age of first stent   Stroke Father    Hypertension Father    Skin cancer Mother    Hyperlipidemia Sister    Stroke Paternal Grandmother    Hypertension Paternal Grandmother    Hypertension Maternal Grandmother    Hypertension Maternal Grandfather    Diabetes Maternal Grandfather    Hypertension Paternal Grandfather    Diabetes Paternal Grandfather    Heart disease Paternal Grandfather     Prior to Admission medications   Medication Sig  Start Date End Date Taking? Authorizing Provider  amLODipine (NORVASC) 10 MG tablet Take 1 tablet by mouth daily.    [provider]  aspirin EC 81 MG EC tablet Take 1 tablet (81 mg total) by mouth daily. Patient taking differently: Take 81 mg by mouth every evening. 09/18/15   Dwana Melena, PA-C  diclofenac (VOLTAREN) 75 MG EC tablet TAKE 1 TABLET BY MOUTH TWICE A DAY 07/06/21   Lenn Sink, DPM  icosapent Ethyl (VASCEPA) 1 g capsule Take 2 capsules (2 g total) by mouth 2 (two) times daily. 02/27/22   Lennette Bihari, MD  inclisiran [name]) 284 MG/1.5ML SOSY injection as directed Subcutaneous every 6 months 05/10/21   [provider]  metoprolol succinate (TOPROL-XL) 50 MG 24 hr tablet Take 1 tablet (50 mg total) by mouth daily. Take with or immediately following a meal. 01/08/22 01/03/23  Lennette Bihari, MD  Multiple Vitamin (MULTIVITAMIN WITH MINERALS) TABS tablet Take 1 tablet by mouth daily.    [provider]  nitroGLYCERIN (NITROSTAT) 0.4 MG SL tablet Place 1 tablet (0.4 mg total) under the tongue every 5 (five) minutes as needed for chest pain (CP or SOB). 07/27/20   Lennette Bihari, MD  OZEMPIC, 0.25 OR 0.5 MG/DOSE, 2 MG/3ML SOPN Inject 0.5mg  x 4 weeks after doing 0.25mg  dosing x 4 weeks as directed Subcutaneous as directed for 30 days 05/15/22   [provider]  rosuvastatin (CRESTOR) 20 MG tablet Take 1 tablet (20 mg total) by mouth daily. 02/27/22   Lennette Bihari, MD  valsartan (DIOVAN) 160 MG tablet Take 1 tablet (160 mg total) by mouth daily. 07/03/22   Lennette Bihari, MD    Physical Exam: Vitals:   09/03/22 1701 09/03/22 1702 09/03/22 2101  BP:  (!) 158/106 (!) 165/89  Pulse:  (!) 122 (!) 101  Resp:  18 15  Temp:  99.2 F (37.3 C)   TempSrc:  Oral   SpO2:  99% 99%  Weight: 108.9 kg    Height: 5\' 9"  (1.753 m)      Constitutional: NAD, calm, comfortable Vitals:   09/03/22 1701 09/03/22 1702 09/03/22 2101  BP:  (!) 158/106 (!) 165/89   Pulse:  (!) 122 (!) 101  Resp:  18 15  Temp:  99.2 F (37.3 C)   TempSrc:  Oral   SpO2:  99% 99%  Weight: 108.9 kg    Height: 5\' 9"  (1.753 m)     Eyes: PERRL, lids and conjunctivae normal ENMT: Mucous membranes are moist.  Neck: normal, supple, no masses, no thyromegaly Respiratory: clear to auscultation bilaterally, no wheezing, no crackles. Normal respiratory effort. No accessory muscle use.  Cardiovascular: Regular rate and rhythm, no murmurs / rubs / gallops. No extremity edema. 2+ pedal pulses. No carotid bruits.  Abdomen: no tenderness, no masses palpated. No hepatosplenomegaly. Bowel sounds positive.  Musculoskeletal: no clubbing / cyanosis. No joint deformity upper and lower extremities.  Skin: no rashes, lesions, ulcers.  No induration Neurologic: No apparent cranial nerve abnormality, moving extremities spontaneously Psychiatric: Normal judgment and insight. Alert and oriented x 3. Normal mood.   Labs on Admission: I have personally reviewed following labs and imaging studies  CBC: Recent Labs  Lab 09/03/22 1737  WBC 9.5  HGB 19.9*  HCT 59.3*  MCV 80.2  PLT 818   Basic Metabolic Panel: Recent Labs  Lab 09/03/22 1737 09/03/22 1747  NA 132*  --   K 3.8  --   CL 101  --   CO2 16*  --   GLUCOSE 160*  --   BUN 47*  --   CREATININE 1.60*  --   CALCIUM 9.6  --   MG  --  2.7*   GFR: Estimated Creatinine Clearance: 70.9 mL/min (A) (by C-G formula based on SCr of 1.6 mg/dL (H)). Liver Function Tests: Recent Labs  Lab 09/03/22 1737  AST 68*  ALT 87*  ALKPHOS 96  BILITOT 0.9  PROT 8.5*  ALBUMIN 5.0   Recent Labs  Lab 09/03/22 1737  LIPASE 45   Urine analysis:    Component Value Date/Time   COLORURINE YELLOW 09/03/2022 1752   APPEARANCEUR CLEAR 09/03/2022 1752   LABSPEC 1.026 09/03/2022 1752   PHURINE 5.0 09/03/2022 1752   GLUCOSEU NEGATIVE 09/03/2022 1752   HGBUR NEGATIVE 09/03/2022 1752   BILIRUBINUR NEGATIVE 09/03/2022 1752   KETONESUR  NEGATIVE 09/03/2022 1752   PROTEINUR 30 (A) 09/03/2022 1752   UROBILINOGEN 0.2 03/08/2014 2117   NITRITE NEGATIVE 09/03/2022 1752   LEUKOCYTESUR NEGATIVE 09/03/2022 1752    Radiological Exams on Admission: CT ABDOMEN PELVIS W CONTRAST  Result Date: 09/03/2022 CLINICAL DATA:  Nausea/vomiting/diarrhea for 4 days, abdominal pain EXAM: CT ABDOMEN AND PELVIS WITH CONTRAST TECHNIQUE: Multidetector CT imaging of the abdomen and pelvis was performed using the standard protocol following bolus administration of intravenous contrast. RADIATION DOSE REDUCTION: This exam was performed according to the departmental dose-optimization program which includes automated exposure control, adjustment of the mA and/or kV according to patient size and/or use of iterative reconstruction technique. CONTRAST:  152mL OMNIPAQUE IOHEXOL 300 MG/ML  SOLN COMPARISON:  03/08/2014 FINDINGS: Lower chest: No acute pleural or parenchymal lung disease. Hepatobiliary: Hepatic steatosis. No focal liver abnormality. Gallbladder is unremarkable. No intrahepatic duct dilation. Pancreas: Unremarkable. No pancreatic ductal dilatation or surrounding inflammatory changes. Spleen: Normal in size without focal abnormality. Adrenals/Urinary Tract: Adrenal glands are unremarkable. Kidneys are normal, without renal calculi, focal lesion, or hydronephrosis. Bladder is unremarkable. Stomach/Bowel: No bowel obstruction or ileus. Gas fluid levels throughout the colon to the level of the rectum consistent with given history of diarrhea. Normal appendix right lower quadrant. No bowel wall thickening or inflammatory change. Vascular/Lymphatic: No significant vascular findings are present. No enlarged abdominal or pelvic lymph nodes. Reproductive: Prostate is unremarkable. Other: No free fluid or free intraperitoneal gas. No abdominal wall hernia. Musculoskeletal: No acute or destructive bony lesions. Reconstructed images demonstrate no additional findings.  IMPRESSION: 1. Diffuse colonic gas fluid levels consistent with history of diarrhea. No bowel obstruction or ileus. 2. Hepatic steatosis. Electronically Signed   By: Randa Ngo M.D.   On: 09/03/2022 19:00    EKG: Sinus rhythm rate at 101.  QTc 462.  No significant change from prior.    Assessment/Plan Principal Problem:   AKI (acute kidney injury) ([place]) Active Problems:   Gastroenteritis   HTN (hypertension)   Stented coronary artery   Evaluate for OSA (obstructive sleep apnea)   Assessment and Plan: *  AKI (acute kidney injury) (HCC) Cr- 1.6.  Baseline about 0.7.  Likely prerenal secondary to gastroenteritis.  He is on valsartan. -2 L bolus given, continue N/s 100cc/hr x 1 day -Hold valsartan  Gastroenteritis CT abdomen and pelvis-findings is consistent with hx of diarrhea, with gas- fluid levels. -Stool C. Difficile -Hydrate -Bowel rest with clear liquid diet  Evaluate for OSA (obstructive sleep apnea) Resume CPAP nightly  Stented coronary artery Required DES for ST elevation MI 2017.  No chest pain.  He is on 6 monthly leqvio.  EKG without significant ST or T wave changes from prior. -Resume statins, aspirin.  HTN (hypertension) Systolic 704U to 889V. -Resume metoprolol, Norvasc -Hold valsartan with AKI    DVT prophylaxis: Heparin Code Status: Full Family Communication: None at bedside Disposition Plan: ~ 1- 2 days Consults called: None  Admission status:  Obs tele    Author: Bethena Roys, MD 09/03/2022 10:00 PM  For on call review [email or web address].

## 2022-09-03 NOTE — ED Notes (Signed)
Patient ambulated to the bathroom. No complaints to report.

## 2022-09-03 NOTE — Assessment & Plan Note (Addendum)
CT abdomen and pelvis-findings is consistent with hx of diarrhea, with gas- fluid levels. -Stool C. Difficile -Hydrate -Bowel rest with clear liquid diet

## 2022-09-03 NOTE — Assessment & Plan Note (Addendum)
Required DES for ST elevation MI 2017.  No chest pain.  He is on 6 monthly leqvio.  EKG without significant ST or T wave changes from prior. -Resume statins, aspirin.

## 2022-09-03 NOTE — ED Provider Notes (Signed)
[facility] EMERGENCY DEPARTMENT Provider Note   CSN: [id number] Arrival date & time: 09/03/22  1617     History  Chief Complaint  Patient presents with   Abdominal Pain    Richard Hull is a 47 y.o. male.   Abdominal Pain Associated symptoms: diarrhea, nausea and vomiting   Associated symptoms: no chest pain, no chills, no cough, no dysuria, no fever, no shortness of breath and no sore throat        Richard Hull is a 47 y.o. male past medical history of hypertension, prior STEMI underwent catheterization in 2017 and again in 2018, hyperglycemia, hyperlipidemia, kidney stones and GERD who presents to the Emergency Department complaining of generalized abdominal pain, vomiting and diarrhea.  Symptoms present for 4 days.  Gradually worsening.  States he has been unable to tolerate any significant amount of fluids or solid food.  States his diarrhea is watery and brown in color.  Denies any melena or hematochezia.  Denies any known fever.  Describes cramping abdominal pain that is nonlocalized.  Generalized weakness, states his abdomen feels "bloated."  No other household members or sick at this time.  He denies any new medications, including recent antibiotics.  No history of abdominal surgeries  Home Medications Prior to Admission medications   Medication Sig Start Date End Date Taking? Authorizing Provider  amLODipine (NORVASC) 10 MG tablet Take 1 tablet by mouth daily.    [provider]  aspirin EC 81 MG EC tablet Take 1 tablet (81 mg total) by mouth daily. Patient taking differently: Take 81 mg by mouth every evening. 09/18/15   Brett Canales, PA-C  diclofenac (VOLTAREN) 75 MG EC tablet TAKE 1 TABLET BY MOUTH TWICE A DAY 07/06/21   Wallene Huh, DPM  icosapent Ethyl (VASCEPA) 1 g capsule Take 2 capsules (2 g total) by mouth 2 (two) times daily. 02/27/22   Troy Sine, MD  inclisiran [place]) 284 MG/1.5ML SOSY injection as directed Subcutaneous every 6 months  05/10/21   [provider]  metoprolol succinate (TOPROL-XL) 50 MG 24 hr tablet Take 1 tablet (50 mg total) by mouth daily. Take with or immediately following a meal. 01/08/22 01/03/23  Troy Sine, MD  Multiple Vitamin (MULTIVITAMIN WITH MINERALS) TABS tablet Take 1 tablet by mouth daily.    [provider]  nitroGLYCERIN (NITROSTAT) 0.4 MG SL tablet Place 1 tablet (0.4 mg total) under the tongue every 5 (five) minutes as needed for chest pain (CP or SOB). 07/27/20   Troy Sine, MD  OZEMPIC, 0.25 OR 0.5 MG/DOSE, 2 MG/3ML SOPN Inject 0.5mg  x 4 weeks after doing 0.25mg  dosing x 4 weeks as directed Subcutaneous as directed for 30 days 05/15/22   [provider]  rosuvastatin (CRESTOR) 20 MG tablet Take 1 tablet (20 mg total) by mouth daily. 02/27/22   Troy Sine, MD  valsartan (DIOVAN) 160 MG tablet Take 1 tablet (160 mg total) by mouth daily. 07/03/22   Troy Sine, MD      Allergies    Amoxicillin and Penicillins    Review of Systems   Review of Systems  Constitutional:  Positive for appetite change. Negative for chills and fever.  HENT:  Negative for sore throat.   Respiratory:  Negative for cough and shortness of breath.   Cardiovascular:  Negative for chest pain.  Gastrointestinal:  Positive for abdominal pain, diarrhea, nausea and vomiting.  Genitourinary:  Negative for dysuria and flank pain.  Musculoskeletal:  Negative for arthralgias, myalgias, neck pain and neck stiffness.  Skin:  Negative for rash.  Neurological:  Positive for weakness. Negative for dizziness and numbness.    Physical Exam Updated Vital Signs BP (!) 158/106   Pulse (!) 122   Temp 99.2 F (37.3 C) (Oral)   Resp 18   Ht 5\' 9"  (1.753 m)   Wt 108.9 kg   SpO2 99%   BMI 35.44 kg/m  Physical Exam Vitals and nursing note reviewed.  Constitutional:      General: He is not in acute distress.    Appearance: He is well-developed. He is not ill-appearing or toxic-appearing.      Comments: Patient is tearful during exam  HENT:     Head: Atraumatic.     Mouth/Throat:     Pharynx: No pharyngeal swelling or oropharyngeal exudate.     Comments: Mucous membranes slightly dry Eyes:     Pupils: Pupils are equal, round, and reactive to light.  Cardiovascular:     Rate and Rhythm: Regular rhythm. Tachycardia present.     Pulses: Normal pulses.  Pulmonary:     Effort: Pulmonary effort is normal. No respiratory distress.  Chest:     Chest wall: No tenderness.  Abdominal:     General: There is distension.     Tenderness: There is abdominal tenderness. There is no right CVA tenderness, left CVA tenderness or guarding.     Comments: Diffuse tenderness to palpation of the abdomen.  Abdomen feels mildly distended.  No guarding or rebound tenderness.  Musculoskeletal:        General: Normal range of motion.     Right lower leg: No edema.     Left lower leg: No edema.  Skin:    General: Skin is warm.     Capillary Refill: Capillary refill takes less than 2 seconds.     Findings: No rash.  Neurological:     Mental Status: He is alert.     Sensory: No sensory deficit.     Motor: No weakness.     ED Results / Procedures / Treatments   Labs (all labs ordered are listed, but only abnormal results are displayed) Labs Reviewed  COMPREHENSIVE METABOLIC PANEL - Abnormal; Notable for the following components:      Result Value   Sodium 132 (*)    CO2 16 (*)    Glucose, Bld 160 (*)    BUN 47 (*)    Creatinine, Ser 1.60 (*)    Total Protein 8.5 (*)    AST 68 (*)    ALT 87 (*)    GFR, Estimated 54 (*)    All other components within normal limits  CBC - Abnormal; Notable for the following components:   RBC 7.39 (*)    Hemoglobin 19.9 (*)    HCT 59.3 (*)    All other components within normal limits  URINALYSIS, ROUTINE W REFLEX MICROSCOPIC - Abnormal; Notable for the following components:   Protein, ur 30 (*)    Bacteria, UA RARE (*)    All other components  within normal limits  LACTIC ACID, PLASMA - Abnormal; Notable for the following components:   Lactic Acid, Venous 2.4 (*)    All other components within normal limits  MAGNESIUM - Abnormal; Notable for the following components:   Magnesium 2.7 (*)    All other components within normal limits  LIPASE, BLOOD  LACTIC ACID, PLASMA    EKG None  Radiology CT ABDOMEN PELVIS  W CONTRAST  Result Date: 09/03/2022 CLINICAL DATA:  Nausea/vomiting/diarrhea for 4 days, abdominal pain EXAM: CT ABDOMEN AND PELVIS WITH CONTRAST TECHNIQUE: Multidetector CT imaging of the abdomen and pelvis was performed using the standard protocol following bolus administration of intravenous contrast. RADIATION DOSE REDUCTION: This exam was performed according to the departmental dose-optimization program which includes automated exposure control, adjustment of the mA and/or kV according to patient size and/or use of iterative reconstruction technique. CONTRAST:  OMNIPAQUE IOHEXOL 300 MG/ML  SOLN COMPARISON:  03/08/2014 FINDINGS: Lower chest: No acute pleural or parenchymal lung disease. Hepatobiliary: Hepatic steatosis. No focal liver abnormality. Gallbladder is unremarkable. No intrahepatic duct dilation. Pancreas: Unremarkable. No pancreatic ductal dilatation or surrounding inflammatory changes. Spleen: Normal in size without focal abnormality. Adrenals/Urinary Tract: Adrenal glands are unremarkable. Kidneys are normal, without renal calculi, focal lesion, or hydronephrosis. Bladder is unremarkable. Stomach/Bowel: No bowel obstruction or ileus. Gas fluid levels throughout the colon to the level of the rectum consistent with given history of diarrhea. Normal appendix right lower quadrant. No bowel wall thickening or inflammatory change. Vascular/Lymphatic: No significant vascular findings are present. No enlarged abdominal or pelvic lymph nodes. Reproductive: Prostate is unremarkable. Other: No free fluid or free  intraperitoneal gas. No abdominal wall hernia. Musculoskeletal: No acute or destructive bony lesions. Reconstructed images demonstrate no additional findings. IMPRESSION: 1. Diffuse colonic gas fluid levels consistent with history of diarrhea. No bowel obstruction or ileus. 2. Hepatic steatosis. Electronically Signed   By: Sharlet Salina M.D.   On: 09/03/2022 19:00    Procedures Procedures    Medications Ordered in ED Medications  sodium chloride 0.9 % bolus 1,000 mL (has no administration in time range)  ondansetron (ZOFRAN) injection 4 mg (has no administration in time range)  morphine (PF) 4 MG/ML injection 4 mg (has no administration in time range)    ED Course/ Medical Decision Making/ A&P                             Medical Decision Making Patient here for evaluation of abdominal pain, vomiting, diarrhea.  Symptoms present for several days, he has been unable to tolerate significant amount of liquids or solid foods.  No recent sick contacts.  No history of prior abdominal surgeries.  On my exam, patient is tearful, vitals reviewed he is tachycardic and has low-grade fever.  His abdomen feels mildly distended and diffusely tender.  No active vomiting.  Differential would include but not limited to acute surgical abdomen including small bowel obstruction, appendicitis, mesenteric ischemia, gastroenteritis, sepsis, C diff infection considered, but  felt less likely as pt denies any recent antibiotics.  He will need labs, urinalysis and CT imaging of the abdomen for further evaluation.  Amount and/or Complexity of Data Reviewed Labs: ordered.    Details: Labs interpreted by me, no evidence of leukocytosis, lipase unremarkable, chemistries show likely dehydration with BUN of 47 and serum creatinine of 1.60.  His creatinine was 0.736 months ago. Initial lactic elevated at 2.4.  I feel this is secondary to dehydration.  Second lactic acid is still pending.  Urinalysis without evidence of  infection, magnesium 2.7 Radiology: ordered.    Details: CT abdomen and pelvis was ordered for further evaluation of patient's symptoms.  Shows diffuse colonic gas/fluid levels consistent with diarrhea no evidence of obstruction or ileus Discussion of management or test interpretation with external provider(s): Sepsis considered initially but patient did not meet SIRS criteria.  Workup shows AKI.  Currently receiving second liter IV fluids.  On recheck, patient reports feeling better continuing to have diarrhea but denies further nausea or vomiting.  I have low clinical suspicion that his diarrhea is secondary to C difficile as he denies any recent antibiotic use.  Recheck of vitals, tachycardia improving.  I feel that he would benefit from hospital admission for his AKI.  Discussed findings with [name], Dr. Mariea Clonts who is agreeable to admit    Risk Prescription drug management.           Final Clinical Impression(s) / ED Diagnoses Final diagnoses:  AKI (acute kidney injury) [facility])    Rx / DC Orders ED Discharge Orders     None         Rosey Bath 09/03/22 2122    Pricilla Loveless, MD 09/04/22 1526

## 2022-09-03 NOTE — Assessment & Plan Note (Signed)
Systolic 333O to 329V. -Resume metoprolol, Norvasc -Hold valsartan with AKI

## 2022-09-03 NOTE — ED Notes (Signed)
Pt provided water and assisted with setting home up CPAP machine  Updated on plan of care, call bell is within reach Denies further needs at this time

## 2022-09-03 NOTE — ED Triage Notes (Signed)
Pt presents with abdominal pain, with vomiting and diarrhea, since last Thursday.

## 2022-09-03 NOTE — Assessment & Plan Note (Signed)
Cr- 1.6.  Baseline about 0.7.  Likely prerenal secondary to gastroenteritis.  He is on valsartan. -2 L bolus given, continue N/s 100cc/hr x 1 day -Hold valsartan

## 2022-09-03 NOTE — Assessment & Plan Note (Signed)
Resume CPAP nightly

## 2022-09-04 DIAGNOSIS — Z6835 Body mass index (BMI) 35.0-35.9, adult: Secondary | ICD-10-CM | POA: Diagnosis not present

## 2022-09-04 DIAGNOSIS — D751 Secondary polycythemia: Secondary | ICD-10-CM | POA: Diagnosis not present

## 2022-09-04 DIAGNOSIS — E669 Obesity, unspecified: Secondary | ICD-10-CM | POA: Diagnosis not present

## 2022-09-04 DIAGNOSIS — R197 Diarrhea, unspecified: Secondary | ICD-10-CM | POA: Diagnosis not present

## 2022-09-04 DIAGNOSIS — E872 Acidosis, unspecified: Secondary | ICD-10-CM | POA: Diagnosis not present

## 2022-09-04 DIAGNOSIS — G4733 Obstructive sleep apnea (adult) (pediatric): Secondary | ICD-10-CM | POA: Diagnosis not present

## 2022-09-04 DIAGNOSIS — Z79899 Other long term (current) drug therapy: Secondary | ICD-10-CM | POA: Diagnosis not present

## 2022-09-04 DIAGNOSIS — I2510 Atherosclerotic heart disease of native coronary artery without angina pectoris: Secondary | ICD-10-CM | POA: Diagnosis not present

## 2022-09-04 DIAGNOSIS — F32A Depression, unspecified: Secondary | ICD-10-CM | POA: Diagnosis not present

## 2022-09-04 DIAGNOSIS — E876 Hypokalemia: Secondary | ICD-10-CM | POA: Diagnosis not present

## 2022-09-04 DIAGNOSIS — E782 Mixed hyperlipidemia: Secondary | ICD-10-CM | POA: Diagnosis not present

## 2022-09-04 DIAGNOSIS — F429 Obsessive-compulsive disorder, unspecified: Secondary | ICD-10-CM | POA: Diagnosis not present

## 2022-09-04 DIAGNOSIS — R6510 Systemic inflammatory response syndrome (SIRS) of non-infectious origin without acute organ dysfunction: Secondary | ICD-10-CM | POA: Diagnosis not present

## 2022-09-04 DIAGNOSIS — R1084 Generalized abdominal pain: Secondary | ICD-10-CM | POA: Diagnosis not present

## 2022-09-04 DIAGNOSIS — R109 Unspecified abdominal pain: Secondary | ICD-10-CM | POA: Diagnosis not present

## 2022-09-04 DIAGNOSIS — E860 Dehydration: Secondary | ICD-10-CM | POA: Diagnosis not present

## 2022-09-04 DIAGNOSIS — E785 Hyperlipidemia, unspecified: Secondary | ICD-10-CM | POA: Diagnosis not present

## 2022-09-04 DIAGNOSIS — I10 Essential (primary) hypertension: Secondary | ICD-10-CM | POA: Diagnosis not present

## 2022-09-04 DIAGNOSIS — A084 Viral intestinal infection, unspecified: Secondary | ICD-10-CM | POA: Diagnosis not present

## 2022-09-04 DIAGNOSIS — N289 Disorder of kidney and ureter, unspecified: Secondary | ICD-10-CM | POA: Diagnosis not present

## 2022-09-04 DIAGNOSIS — K529 Noninfective gastroenteritis and colitis, unspecified: Secondary | ICD-10-CM | POA: Diagnosis not present

## 2022-09-04 DIAGNOSIS — N179 Acute kidney failure, unspecified: Secondary | ICD-10-CM | POA: Diagnosis not present

## 2022-09-04 LAB — CBC
HCT: 51.8 % (ref 39.0–52.0)
Hemoglobin: 17.3 g/dL — ABNORMAL HIGH (ref 13.0–17.0)
MCH: 27.2 pg (ref 26.0–34.0)
MCHC: 33.4 g/dL (ref 30.0–36.0)
MCV: 81.3 fL (ref 80.0–100.0)
Platelets: 268 10*3/uL (ref 150–400)
RBC: 6.37 MIL/uL — ABNORMAL HIGH (ref 4.22–5.81)
RDW: 13.5 % (ref 11.5–15.5)
WBC: 7 10*3/uL (ref 4.0–10.5)
nRBC: 0 % (ref 0.0–0.2)

## 2022-09-04 LAB — C DIFFICILE QUICK SCREEN W PCR REFLEX
C Diff antigen: NEGATIVE
C Diff interpretation: NOT DETECTED
C Diff toxin: NEGATIVE

## 2022-09-04 LAB — BASIC METABOLIC PANEL
Anion gap: 9 (ref 5–15)
BUN: 33 mg/dL — ABNORMAL HIGH (ref 6–20)
CO2: 19 mmol/L — ABNORMAL LOW (ref 22–32)
Calcium: 8.5 mg/dL — ABNORMAL LOW (ref 8.9–10.3)
Chloride: 106 mmol/L (ref 98–111)
Creatinine, Ser: 1.19 mg/dL (ref 0.61–1.24)
GFR, Estimated: >60 mL/min (ref >60)
Glucose, Bld: 132 mg/dL — ABNORMAL HIGH (ref 70–99)
Potassium: 3.6 mmol/L (ref 3.5–5.1)
Sodium: 134 mmol/L — ABNORMAL LOW (ref 135–145)

## 2022-09-04 LAB — HIV ANTIBODY (ROUTINE TESTING W REFLEX): HIV Screen 4th Generation wRfx: NONREACTIVE

## 2022-09-04 MED ORDER — SODIUM CHLORIDE 0.9 % IV SOLN: INTRAVENOUS | Status: DC

## 2022-09-04 NOTE — Progress Notes (Addendum)
PROGRESS NOTE    Richard Hull  KCL:[id number] DOB: 1976-08-20 DOA: 09/03/2022 PCP: Shon Baton, MD    Brief Narrative:   Richard Hull is a 47 y.o. male with past medical history significant for CAD, HTN, OSA on CPAP who presented to [facility] ED on 1/15 with nausea/vomiting, diarrhea.  Patient reports onset 4 days prior.  Symptoms have been progressively worsening over this timeframe with multiple episodes of watery stools.  Patient reports poor oral intake.  Denies any sick contacts, no recent antibiotic use.  No changes in dietary habits.  In the ED, temperature 99.2 F, HR 122, RR 18, BP 158/106, SpO2 99% on room air.  WBC 9.5, hemoglobin 19.9, platelets 356.  Sodium 132, potassium 3.8, chloride 101, CO2 16, glucose 160, BUN 47, creatinine 1.60.  Lipase 45.  AST 68, ALT 87, total bilirubin 0.9.  Lactic acid 2.4.  Urinalysis unrevealing.  CT Abdo/pelvis with diffuse colonic gas/fluid levels consistent with history of diarrhea, bowel obstruction ileus.  IV fluid bolus given, 4 mg IV morphine given in the ED.  TRH consulted for admission for further evaluation management of acute viral gastroenteritis.  Assessment & Plan:   Gastroenteritis, likely viral Patient presenting with 4-day history of nausea/vomiting, diarrhea.  Denies recent antibiotic use, no sick contacts or dietary changes.  Patient is afebrile without leukocytosis.  C. difficile PCR negative. --Continue enteric precautions -- Advance from clear liquid to soft diet today -- GI PCR panel: Pending -- IV fluid hydration, supportive care, antiemetics -- Strict I's and O's, monitor bowel movements  Acute renal failure: Resolved Creatinine 1.60 on admission, baseline 0.7.  Etiology likely prerenal in the setting of profuse diarrhea from underlying gastroenteritis as above. --Cr 1.60>1.19 --Continue to hold home valsartan -- Continue IVF with NS at 100 mL/h -- BMP in am  Elevated LFTs Likely in the setting of  gastroenteritis as above. -- Hold home Crestor -- CMP in a.m.  CAD Underwent PCI stent 2017. -- Continue aspirin, Vascepa, Leqvio q6 months -- Holding statin for elevated LFTs  Essential hypertension -- Amlodipine 10 mg p.o. daily --Metoprolol succinate 50 mg p.o. daily -- Continue to hold valsartan as above of -- Monitor BP closely  DVT prophylaxis: heparin injection 5,000 Units Start: 09/03/22 2245    Code Status: Full Code Family Communication: Updated spouse present at bedside  Disposition Plan:  Level of care: Telemetry Status is: Observation The patient remains OBS appropriate and will d/c before 2 midnights.    Consultants:  None  Procedures:  None  Antimicrobials:  None   Subjective: Patient seen examined bedside, resting comfortably.  Lying in bed.  Reports his diarrhea is much improved.  Currently tolerating clear liquid diet which is further advancement today.  Awaiting to obtain sample for C. difficile/GI PCR panel.  No other specific questions or concerns at this time.  Spouse updated at bedside.  Denies headache, no dizziness, no chest pain, no palpitations, no shortness of breath, no abdominal pain, no fever/chills/night sweats, no nausea/vomiting/diarrhea, no focal weakness, no fatigue, no paresthesias.  No acute events overnight per nurse staff.  Objective: Vitals:   09/04/22 0400 09/04/22 0431 09/04/22 0459 09/04/22 0500  BP: 132/73  (!) 148/83   Pulse: 92 89 (!) 104   Resp: 18 19 20    Temp:  97.9 F (36.6 C) 97.7 F (36.5 C)   TempSrc:  Oral    SpO2: 97% 96% 100%   Weight:    108.5 kg  Height:  5\' 9"  (1.753 m)    Intake/Output Summary (Last 24 hours) at 09/04/2022 1424 Last data filed at 09/03/2022 2102 Gross per 24 hour  Intake 2000 ml  Output --  Net 2000 ml   Filed Weights   09/03/22 1701 09/04/22 0500  Weight: 108.9 kg 108.5 kg    Examination:  Physical Exam: GEN: NAD, alert and oriented x 3, wd/wn HEENT: NCAT, PERRL, EOMI,  sclera clear, MMM PULM: CTAB w/o wheezes/crackles, normal respiratory effort, on room air CV: RRR w/o M/G/R GI: abd soft, NTND, NABS, no R/G/M MSK: no peripheral edema, muscle strength globally intact 5/5 bilateral upper/lower extremities NEURO: CN II-XII intact, no focal deficits, sensation to light touch intact PSYCH: normal mood/affect Integumentary: dry/intact, no rashes or wounds    Data Reviewed: I have personally reviewed following labs and imaging studies  CBC: Recent Labs  Lab 09/03/22 1737 09/04/22 0457  WBC 9.5 7.0  HGB 19.9* 17.3*  HCT 59.3* 51.8  MCV 80.2 81.3  PLT 356 789   Basic Metabolic Panel: Recent Labs  Lab 09/03/22 1737 09/03/22 1747 09/04/22 0457  NA 132*  --  134*  K 3.8  --  3.6  CL 101  --  106  CO2 16*  --  19*  GLUCOSE 160*  --  132*  BUN 47*  --  33*  CREATININE 1.60*  --  1.19  CALCIUM 9.6  --  8.5*  MG  --  2.7*  --    GFR: Estimated Creatinine Clearance: 95.1 mL/min (by C-G formula based on SCr of 1.19 mg/dL). Liver Function Tests: Recent Labs  Lab 09/03/22 1737  AST 68*  ALT 87*  ALKPHOS 96  BILITOT 0.9  PROT 8.5*  ALBUMIN 5.0   Recent Labs  Lab 09/03/22 1737  LIPASE 45   No results for input(s): "AMMONIA" in the last 168 hours. Coagulation Profile: No results for input(s): "INR", "PROTIME" in the last 168 hours. Cardiac Enzymes: No results for input(s): "CKTOTAL", "CKMB", "CKMBINDEX", "TROPONINI" in the last 168 hours. BNP (last 3 results) No results for input(s): "PROBNP" in the last 8760 hours. HbA1C: No results for input(s): "HGBA1C" in the last 72 hours. CBG: No results for input(s): "GLUCAP" in the last 168 hours. Lipid Profile: No results for input(s): "CHOL", "HDL", "LDLCALC", "TRIG", "CHOLHDL", "LDLDIRECT" in the last 72 hours. Thyroid Function Tests: No results for input(s): "TSH", "T4TOTAL", "FREET4", "T3FREE", "THYROIDAB" in the last 72 hours. Anemia Panel: No results for input(s): "VITAMINB12",  "FOLATE", "FERRITIN", "TIBC", "IRON", "RETICCTPCT" in the last 72 hours. Sepsis Labs: Recent Labs  Lab 09/03/22 1747 09/03/22 1929  LATICACIDVEN 2.4* 2.6*    Recent Results (from the past 240 hour(s))  C Difficile Quick Screen w PCR reflex     Status: None   Collection Time: 09/04/22 11:30 AM   Specimen: STOOL  Result Value Ref Range Status   C Diff antigen NEGATIVE NEGATIVE Final   C Diff toxin NEGATIVE NEGATIVE Final   C Diff interpretation No C. difficile detected.  Final    Comment: Performed at [facility], [address] [place], [place] [address]         Radiology Studies: CT ABDOMEN PELVIS W CONTRAST  Result Date: 09/03/2022 CLINICAL DATA:  Nausea/vomiting/diarrhea for 4 days, abdominal pain EXAM: CT ABDOMEN AND PELVIS WITH CONTRAST TECHNIQUE: Multidetector CT imaging of the abdomen and pelvis was performed using the standard protocol following bolus administration of intravenous contrast. RADIATION DOSE REDUCTION: This exam was performed according to the departmental  dose-optimization program which includes automated exposure control, adjustment of the mA and/or kV according to patient size and/or use of iterative reconstruction technique. CONTRAST:  OMNIPAQUE IOHEXOL 300 MG/ML  SOLN COMPARISON:  03/08/2014 FINDINGS: Lower chest: No acute pleural or parenchymal lung disease. Hepatobiliary: Hepatic steatosis. No focal liver abnormality. Gallbladder is unremarkable. No intrahepatic duct dilation. Pancreas: Unremarkable. No pancreatic ductal dilatation or surrounding inflammatory changes. Spleen: Normal in size without focal abnormality. Adrenals/Urinary Tract: Adrenal glands are unremarkable. Kidneys are normal, without renal calculi, focal lesion, or hydronephrosis. Bladder is unremarkable. Stomach/Bowel: No bowel obstruction or ileus. Gas fluid levels throughout the colon to the level of the rectum consistent with given history of diarrhea. Normal appendix right lower  quadrant. No bowel wall thickening or inflammatory change. Vascular/Lymphatic: No significant vascular findings are present. No enlarged abdominal or pelvic lymph nodes. Reproductive: Prostate is unremarkable. Other: No free fluid or free intraperitoneal gas. No abdominal wall hernia. Musculoskeletal: No acute or destructive bony lesions. Reconstructed images demonstrate no additional findings. IMPRESSION: 1. Diffuse colonic gas fluid levels consistent with history of diarrhea. No bowel obstruction or ileus. 2. Hepatic steatosis. Electronically Signed   By: Sharlet Salina M.D.   On: 09/03/2022 19:00        Scheduled Meds:  amLODipine  10 mg Oral Daily   aspirin EC  81 mg Oral QPM   heparin  5,000 Units Subcutaneous Q8H   metoprolol succinate  50 mg Oral Daily   Continuous Infusions:  sodium chloride 100 mL/hr at 09/04/22 1130     LOS: 0 days    Time spent: 50 minutes spent on chart review, discussion with nursing staff, consultants, updating family and interview/physical exam; more than 50% of that time was spent in counseling and/or coordination of care.    Alvira Philips [place], DO [facility] Available via Epic secure chat 7am-7pm After these hours, please refer to coverage provider listed on [email or web address] 09/04/2022, 2:24 PM

## 2022-09-04 NOTE — Progress Notes (Addendum)
Has had only one small loose stool today which was sent to lab. Patient reports not as loose as it was yesterday.  Neg for Cdiff and panel still pending.  Appetite has returned and tolerating diet and asking for snacks in between with no nausea.

## 2022-09-04 NOTE — Progress Notes (Signed)
  Transition of Care [facility]) Screening Note   Patient Details  Name: Richard Hull Date of Birth: Jun 08, 1976   [facility] Care [facility]) CM/SW Contact:    Iona Beard, [place] Phone Number: 09/04/2022, 9:43 AM    Transition of Care Department [facility]) has reviewed patient and no TOC needs have been identified at this time. We will continue to monitor patient advancement through interdisciplinary progression rounds. If new patient transition needs arise, please place a TOC consult.

## 2022-09-04 NOTE — ED Notes (Signed)
Pt on his CPAP machine, will get Temp in the morning

## 2022-09-05 ENCOUNTER — Ambulatory Visit: Payer: BC Managed Care – PPO

## 2022-09-05 DIAGNOSIS — N179 Acute kidney failure, unspecified: Secondary | ICD-10-CM | POA: Diagnosis not present

## 2022-09-05 LAB — GASTROINTESTINAL PANEL BY PCR, STOOL (REPLACES STOOL CULTURE)

## 2022-09-05 LAB — COMPREHENSIVE METABOLIC PANEL
ALT: 50 U/L — ABNORMAL HIGH (ref 0–44)
AST: 33 U/L (ref 15–41)
Albumin: 3.6 g/dL (ref 3.5–5.0)
Alkaline Phosphatase: 72 U/L (ref 38–126)
Anion gap: 7 (ref 5–15)
BUN: 21 mg/dL — ABNORMAL HIGH (ref 6–20)
CO2: 24 mmol/L (ref 22–32)
Calcium: 8.4 mg/dL — ABNORMAL LOW (ref 8.9–10.3)
Chloride: 107 mmol/L (ref 98–111)
Creatinine, Ser: 1.25 mg/dL — ABNORMAL HIGH (ref 0.61–1.24)
GFR, Estimated: >60 mL/min (ref >60)
Glucose, Bld: 114 mg/dL — ABNORMAL HIGH (ref 70–99)
Potassium: 3.8 mmol/L (ref 3.5–5.1)
Sodium: 138 mmol/L (ref 135–145)
Total Bilirubin: 0.6 mg/dL (ref 0.3–1.2)
Total Protein: 6.3 g/dL — ABNORMAL LOW (ref 6.5–8.1)

## 2022-09-05 LAB — CBC
HCT: 48.7 % (ref 39.0–52.0)
Hemoglobin: 15.9 g/dL (ref 13.0–17.0)
MCH: 27.3 pg (ref 26.0–34.0)
MCHC: 32.6 g/dL (ref 30.0–36.0)
MCV: 83.7 fL (ref 80.0–100.0)
Platelets: 234 10*3/uL (ref 150–400)
RBC: 5.82 MIL/uL — ABNORMAL HIGH (ref 4.22–5.81)
RDW: 13.7 % (ref 11.5–15.5)
WBC: 5.6 10*3/uL (ref 4.0–10.5)
nRBC: 0 % (ref 0.0–0.2)

## 2022-09-05 LAB — LACTIC ACID, PLASMA: Lactic Acid, Venous: 1.4 mmol/L (ref 0.5–1.9)

## 2022-09-05 NOTE — Progress Notes (Signed)
Patient awake most of the night. He had 1 incontinent bowel movement. He denies any pain or discomfort at this time.

## 2022-09-05 NOTE — Discharge Summary (Signed)
Physician Discharge Summary  [name] WCB:[id number] DOB: 11-14-1975 DOA: 09/03/2022  PCP: [name], MD  Admit date: 09/03/2022 Discharge date: 09/05/2022    Admitted From: Home  Disposition: Home  Recommendations for Outpatient Follow-up:  Follow up with PCP in 1-2 weeks Please obtain BMP/CBC in one week Please follow up with your PCP on the following pending results: Unresulted Labs (From admission, onward)     Start     Ordered   09/04/22 0642  Gastrointestinal Panel by PCR , Stool  (Gastrointestinal Panel by PCR, Stool                                                                                                                                                     **Does Not include CLOSTRIDIUM DIFFICILE testing. **If CDIFF testing is needed, place order from the "C Difficile Testing" order set.**)  Once,   R        09/04/22 0641              Home Health: None Equipment/Devices: None  Discharge Condition: Stable CODE STATUS: Full code Diet recommendation: Cardiac  Subjective: Seen and examined, feels well, no complaints, no abdominal pain diarrhea or nausea or vomiting and he feels ready to discharge today.  Brief/Interim Summary: Richard Hull is a 47 y.o. male with past medical history significant for CAD, HTN, OSA on CPAP who presented to [facility], ED on 1/15 with nausea/vomiting, diarrhea for 4 days. Denies any sick contacts, no recent antibiotic use.  No changes in dietary habits.   In the ED, temperature 99.2 F, HR 122, RR 18, BP 158/106, SpO2 99% on room air.  WBC 9.5, hemoglobin 19.9, platelets 356.  Sodium 132, potassium 3.8, chloride 101, CO2 16, glucose 160, BUN 47, creatinine 1.60.  Lipase 45.  AST 68, ALT 87, total bilirubin 0.9.  Lactic acid 2.4.  Urinalysis unrevealing.  CT Abdo/pelvis with diffuse colonic gas/fluid levels consistent with history of diarrhea, bowel obstruction ileus.  IV fluid bolus given, 4 mg IV morphine given in the ED.  TRH  consulted for admission for further evaluation management of acute viral gastroenteritis.  Patient was treated with supportive care, C. difficile assessed and negative, GI pathogen panel is still pending but patient's symptoms have improved, he is tolerating regular diet without any nausea vomiting, no diarrhea, no abdominal pain so he is going to be discharged today.   Acute renal failure: Resolved Creatinine 1.60 on admission, baseline 0.7.  Given some IV fluids, creatinine improved to 1.19 yesterday and 1.25 today, not much different compared to yesterday.  Patient has been advised to keep himself well-hydrated at home.  He verbalized understanding.   Elevated LFTs Likely in the setting of gastroenteritis as above.  Resolved.   CAD Underwent PCI stent 2017. -- Continue aspirin, Vascepa, Leqvio q6  months -- Holding statin for elevated LFTs   Essential hypertension Controlled, resume home medications.  Discharge plan was discussed with patient and/or family member and they verbalized understanding and agreed with it.  Discharge Diagnoses:  Principal Problem:   AKI (acute kidney injury) ([place]) Active Problems:   Gastroenteritis   HTN (hypertension)   Stented coronary artery   Evaluate for OSA (obstructive sleep apnea)    Discharge Instructions   Allergies as of 09/05/2022       Reactions   Amoxicillin Rash   Penicillins Rash, Other (See Comments)   Has patient had a PCN reaction causing immediate rash, facial/tongue/throat swelling, SOB or lightheadedness with hypotension: Yes Has patient had a PCN reaction causing severe rash involving mucus membranes or skin necrosis: No Has patient had a PCN reaction that required hospitalization No Has patient had a PCN reaction occurring within the last 10 years: No If all of the above answers are "NO", then may proceed with Cephalosporin use.        Medication List     STOP taking these medications    diclofenac 75 MG EC  tablet Commonly known as: VOLTAREN       TAKE these medications    amLODipine 10 MG tablet Commonly known as: NORVASC Take 1 tablet by mouth daily.   aspirin EC 81 MG tablet Take 1 tablet (81 mg total) by mouth daily. What changed: when to take this   icosapent Ethyl 1 g capsule Commonly known as: VASCEPA Take 2 capsules (2 g total) by mouth 2 (two) times daily.   Leqvio 284 MG/1.5ML Sosy injection Generic drug: inclisiran as directed Subcutaneous every 6 months   metoprolol succinate 50 MG 24 hr tablet Commonly known as: TOPROL-XL Take 1 tablet (50 mg total) by mouth daily. Take with or immediately following a meal.   nitroGLYCERIN 0.4 MG SL tablet Commonly known as: NITROSTAT Place 1 tablet (0.4 mg total) under the tongue every 5 (five) minutes as needed for chest pain (CP or SOB).   Ozempic (0.25 or 0.5 MG/DOSE) 2 MG/3ML Sopn Generic drug: Semaglutide(0.25 or 0.5MG /DOS) Inject 0.5mg  x 4 weeks after doing 0.25mg  dosing x 4 weeks as directed Subcutaneous as directed for 30 days   rosuvastatin 20 MG tablet Commonly known as: CRESTOR Take 1 tablet (20 mg total) by mouth daily.   valsartan 160 MG tablet Commonly known as: Diovan Take 1 tablet (160 mg total) by mouth daily.        Follow-up Information     Shon Baton, MD Follow up in 1 week(s).   Specialty: Internal Medicine Contact information: [place] [address] [phone]                Allergies  Allergen Reactions   Amoxicillin Rash   Penicillins Rash and Other (See Comments)    Has patient had a PCN reaction causing immediate rash, facial/tongue/throat swelling, SOB or lightheadedness with hypotension: Yes Has patient had a PCN reaction causing severe rash involving mucus membranes or skin necrosis: No Has patient had a PCN reaction that required hospitalization No Has patient had a PCN reaction occurring within the last 10 years: No If all of the above answers are  "NO", then may proceed with Cephalosporin use.     Consultations: None   Procedures/Studies: CT ABDOMEN PELVIS W CONTRAST  Result Date: 09/03/2022 CLINICAL DATA:  Nausea/vomiting/diarrhea for 4 days, abdominal pain EXAM: CT ABDOMEN AND PELVIS WITH CONTRAST TECHNIQUE: Multidetector CT imaging of the  abdomen and pelvis was performed using the standard protocol following bolus administration of intravenous contrast. RADIATION DOSE REDUCTION: This exam was performed according to the departmental dose-optimization program which includes automated exposure control, adjustment of the mA and/or kV according to patient size and/or use of iterative reconstruction technique. CONTRAST:  OMNIPAQUE IOHEXOL 300 MG/ML  SOLN COMPARISON:  03/08/2014 FINDINGS: Lower chest: No acute pleural or parenchymal lung disease. Hepatobiliary: Hepatic steatosis. No focal liver abnormality. Gallbladder is unremarkable. No intrahepatic duct dilation. Pancreas: Unremarkable. No pancreatic ductal dilatation or surrounding inflammatory changes. Spleen: Normal in size without focal abnormality. Adrenals/Urinary Tract: Adrenal glands are unremarkable. Kidneys are normal, without renal calculi, focal lesion, or hydronephrosis. Bladder is unremarkable. Stomach/Bowel: No bowel obstruction or ileus. Gas fluid levels throughout the colon to the level of the rectum consistent with given history of diarrhea. Normal appendix right lower quadrant. No bowel wall thickening or inflammatory change. Vascular/Lymphatic: No significant vascular findings are present. No enlarged abdominal or pelvic lymph nodes. Reproductive: Prostate is unremarkable. Other: No free fluid or free intraperitoneal gas. No abdominal wall hernia. Musculoskeletal: No acute or destructive bony lesions. Reconstructed images demonstrate no additional findings. IMPRESSION: 1. Diffuse colonic gas fluid levels consistent with history of diarrhea. No bowel obstruction or ileus. 2.  Hepatic steatosis. Electronically Signed   By: Sharlet Salina M.D.   On: 09/03/2022 19:00     Discharge Exam: Vitals:   09/04/22 1957 09/05/22 0438  BP: (!) 143/95 132/83  Pulse: 100 91  Resp: 20 18  Temp: 99.1 F (37.3 C) 98.8 F (37.1 C)  SpO2: 97% 98%   Vitals:   09/04/22 0500 09/04/22 1433 09/04/22 1957 09/05/22 0438  BP:  132/85 (!) 143/95 132/83  Pulse:  98 100 91  Resp:   20 18  Temp:  98.6 F (37 C) 99.1 F (37.3 C) 98.8 F (37.1 C)  TempSrc:  Oral Oral Oral  SpO2:  97% 97% 98%  Weight: 108.5 kg     Height: 5\' 9"  (1.753 m)       General: Pt is alert, awake, not in acute distress Cardiovascular: RRR, S1/S2 +, no rubs, no gallops Respiratory: CTA bilaterally, no wheezing, no rhonchi Abdominal: Soft, NT, ND, bowel sounds + Extremities: no edema, no cyanosis    The results of significant diagnostics from this hospitalization (including imaging, microbiology, ancillary and laboratory) are listed below for reference.     Microbiology: Recent Results (from the past 240 hour(s))  C Difficile Quick Screen w PCR reflex     Status: None   Collection Time: 09/04/22 11:30 AM   Specimen: STOOL  Result Value Ref Range Status   C Diff antigen NEGATIVE NEGATIVE Final   C Diff toxin NEGATIVE NEGATIVE Final   C Diff interpretation No C. difficile detected.  Final    Comment: Performed at [facility], [address], [place], [place]     Labs: BNP (last 3 results) No results for input(s): "BNP" in the last 8760 hours. Basic Metabolic Panel: Recent Labs  Lab 09/03/22 1737 09/03/22 1747 09/04/22 0457 09/05/22 0313  NA 132*  --  134* 138  K 3.8  --  3.6 3.8  CL 101  --  106 107  CO2 16*  --  19* 24  GLUCOSE 160*  --  132* 114*  BUN 47*  --  33* 21*  CREATININE 1.60*  --  1.19 1.25*  CALCIUM 9.6  --  8.5* 8.4*  MG  --  2.7*  --   --  Liver Function Tests: Recent Labs  Lab 09/03/22 1737 09/05/22 0313  AST 68* 33  ALT 87* 50*  ALKPHOS 96 72   BILITOT 0.9 0.6  PROT 8.5* 6.3*  ALBUMIN 5.0 3.6   Recent Labs  Lab 09/03/22 1737  LIPASE 45   No results for input(s): "AMMONIA" in the last 168 hours. CBC: Recent Labs  Lab 09/03/22 1737 09/04/22 0457 09/05/22 0313  WBC 9.5 7.0 5.6  HGB 19.9* 17.3* 15.9  HCT 59.3* 51.8 48.7  MCV 80.2 81.3 83.7  PLT 356 268 234   Cardiac Enzymes: No results for input(s): "CKTOTAL", "CKMB", "CKMBINDEX", "TROPONINI" in the last 168 hours. BNP: Invalid input(s): "POCBNP" CBG: No results for input(s): "GLUCAP" in the last 168 hours. D-Dimer No results for input(s): "DDIMER" in the last 72 hours. Hgb A1c No results for input(s): "HGBA1C" in the last 72 hours. Lipid Profile No results for input(s): "CHOL", "HDL", "LDLCALC", "TRIG", "CHOLHDL", "LDLDIRECT" in the last 72 hours. Thyroid function studies No results for input(s): "TSH", "T4TOTAL", "T3FREE", "THYROIDAB" in the last 72 hours.  Invalid input(s): "FREET3" Anemia work up No results for input(s): "VITAMINB12", "FOLATE", "FERRITIN", "TIBC", "IRON", "RETICCTPCT" in the last 72 hours. Urinalysis    Component Value Date/Time   COLORURINE YELLOW 09/03/2022 1752   APPEARANCEUR CLEAR 09/03/2022 1752   LABSPEC 1.026 09/03/2022 1752   PHURINE 5.0 09/03/2022 1752   GLUCOSEU NEGATIVE 09/03/2022 1752   HGBUR NEGATIVE 09/03/2022 1752   BILIRUBINUR NEGATIVE 09/03/2022 1752   KETONESUR NEGATIVE 09/03/2022 1752   PROTEINUR 30 (A) 09/03/2022 1752   UROBILINOGEN 0.2 03/08/2014 2117   NITRITE NEGATIVE 09/03/2022 1752   LEUKOCYTESUR NEGATIVE 09/03/2022 1752   Sepsis Labs Recent Labs  Lab 09/03/22 1737 09/04/22 0457 09/05/22 0313  WBC 9.5 7.0 5.6   Microbiology Recent Results (from the past 240 hour(s))  C Difficile Quick Screen w PCR reflex     Status: None   Collection Time: 09/04/22 11:30 AM   Specimen: STOOL  Result Value Ref Range Status   C Diff antigen NEGATIVE NEGATIVE Final   C Diff toxin NEGATIVE NEGATIVE Final   C Diff  interpretation No C. difficile detected.  Final    Comment: Performed at [facility], [address], [place], [place] [address]     Time coordinating discharge: Over 30 minutes  SIGNED:   Darliss Cheney, MD  [facility] 09/05/2022, 8:40 AM *Please note that this is a verbal dictation therefore any spelling or grammatical errors are due to the "Ideal One" system interpretation. If 7PM-7AM, please contact night-coverage [email or web address]

## 2022-09-05 NOTE — Patient Outreach (Signed)
  Care Coordination   Follow Up Visit Note   09/05/2022 Name: Richard Hull MRN: [id number] DOB: 07-Jun-1976  Richard Hull is a 47 y.o. year old male who sees Shon Baton, MD for primary care. I spoke with  [name] by phone today.  What matters to the patients health and wellness today?  Patient is concerned about having reoccurring diarrhea and feeling bad overall.      Goals Addressed             This Visit's Progress    To recover from stomach virus       Care Coordination Interventions: Evaluation of current treatment plan related to gastroenteritis and patient's adherence to plan as established by provider Received inbound call from patient concerning reoccurring symptoms related to gastroenteritis Determined patient experienced a 2 day admission to [facility] for acute kidney [facility], discharged home this morning Discussed patient felt he was recovering upon being discharged, however he is beginning to experiencing reoccurring symptoms of diarrhea and feeling bad overall Reviewed and discussed hospital discharge instructions, determined patient placed call to his PCP to report symptoms, he was advised to start Imodium and follow the BRAT diet Reinforced to patient the importance to take Imodium as directed and continue to push fluids such as Gatorade zero diluted with water, Pedialyte and Pedialyte popsicles  Instructed patient not to reintroduce food until his symptoms have improved Instructed patient to contact his PCP promptly for new or worsening symptoms            SDOH assessments and interventions completed:  No     Care Coordination Interventions:  Yes, provided   Follow up plan: Follow up call scheduled for 09/11/22 @12  PM     Encounter Outcome:  Pt. Visit Completed

## 2022-09-05 NOTE — Patient Instructions (Signed)
Visit Information  Thank you for taking time to visit with me today. Please don't hesitate to contact me if I can be of assistance to you.   Following are the goals we discussed today:   Goals Addressed             This Visit's Progress    To recover from stomach virus       Care Coordination Interventions: Evaluation of current treatment plan related to gastroenteritis and patient's adherence to plan as established by provider Received inbound call from patient concerning reoccurring symptoms related to gastroenteritis Determined patient experienced a 2 day admission to [facility] for acute kidney injury, discharged home this morning Discussed patient felt he was recovering upon being discharged, however he is beginning to experiencing reoccurring symptoms of diarrhea and feeling bad overall Reviewed and discussed hospital discharge instructions, determined patient placed call to his PCP to report symptoms, he was advised to start Imodium and follow the BRAT diet Reinforced to patient the importance to take Imodium as directed and continue to push fluids such as Gatorade zero diluted with water, Pedialyte and Pedialyte popsicles  Instructed patient not to reintroduce food until his symptoms have improved Instructed patient to contact his PCP promptly for new or worsening symptoms            Our next appointment is by telephone on 09/11/22 at 12 PM  Please call the care guide team at [phone] if you need to cancel or reschedule your appointment.   If you are experiencing a Mental Health or [place] or need someone to talk to, please go to [facility] [address] [place] [phone])  Patient verbalizes understanding of instructions and care plan provided today and agrees to view in [place]. Active MyChart status and patient understanding of how to access instructions and care plan via MyChart confirmed with  patient.     Barb Merino, RN, BSN, CCM Care Management Coordinator [facility] Care Management Direct Phone: [phone]

## 2022-09-07 ENCOUNTER — Encounter (HOSPITAL_COMMUNITY): Payer: Self-pay

## 2022-09-07 ENCOUNTER — Other Ambulatory Visit: Payer: Self-pay

## 2022-09-07 ENCOUNTER — Observation Stay (HOSPITAL_COMMUNITY)
Admission: EM | Admit: 2022-09-07 | Discharge: 2022-09-10 | Disposition: A | Discharge status: Home / Self Care | Payer: BC Managed Care – PPO | Attending: Internal Medicine | Admitting: Internal Medicine

## 2022-09-07 DIAGNOSIS — K529 Noninfective gastroenteritis and colitis, unspecified: Principal | ICD-10-CM | POA: Insufficient documentation

## 2022-09-07 DIAGNOSIS — Z635 Disruption of family by separation and divorce: Secondary | ICD-10-CM

## 2022-09-07 DIAGNOSIS — Z981 Arthrodesis status: Secondary | ICD-10-CM

## 2022-09-07 DIAGNOSIS — I10 Essential (primary) hypertension: Secondary | ICD-10-CM | POA: Diagnosis present

## 2022-09-07 DIAGNOSIS — Z808 Family history of malignant neoplasm of other organs or systems: Secondary | ICD-10-CM | POA: Diagnosis not present

## 2022-09-07 DIAGNOSIS — E872 Acidosis, unspecified: Secondary | ICD-10-CM | POA: Diagnosis present

## 2022-09-07 DIAGNOSIS — Z87442 Personal history of urinary calculi: Secondary | ICD-10-CM | POA: Diagnosis not present

## 2022-09-07 DIAGNOSIS — E860 Dehydration: Secondary | ICD-10-CM | POA: Diagnosis present

## 2022-09-07 DIAGNOSIS — G4733 Obstructive sleep apnea (adult) (pediatric): Secondary | ICD-10-CM | POA: Insufficient documentation

## 2022-09-07 DIAGNOSIS — N179 Acute kidney failure, unspecified: Secondary | ICD-10-CM | POA: Diagnosis present

## 2022-09-07 DIAGNOSIS — R197 Diarrhea, unspecified: Secondary | ICD-10-CM | POA: Diagnosis present

## 2022-09-07 DIAGNOSIS — R6510 Systemic inflammatory response syndrome (SIRS) of non-infectious origin without acute organ dysfunction: Secondary | ICD-10-CM | POA: Diagnosis present

## 2022-09-07 DIAGNOSIS — Z6835 Body mass index (BMI) 35.0-35.9, adult: Secondary | ICD-10-CM | POA: Diagnosis not present

## 2022-09-07 DIAGNOSIS — Z888 Allergy status to other drugs, medicaments and biological substances status: Secondary | ICD-10-CM

## 2022-09-07 DIAGNOSIS — N289 Disorder of kidney and ureter, unspecified: Secondary | ICD-10-CM

## 2022-09-07 DIAGNOSIS — E876 Hypokalemia: Secondary | ICD-10-CM | POA: Insufficient documentation

## 2022-09-07 DIAGNOSIS — F429 Obsessive-compulsive disorder, unspecified: Secondary | ICD-10-CM | POA: Diagnosis present

## 2022-09-07 DIAGNOSIS — A084 Viral intestinal infection, unspecified: Secondary | ICD-10-CM | POA: Diagnosis present

## 2022-09-07 DIAGNOSIS — I252 Old myocardial infarction: Secondary | ICD-10-CM

## 2022-09-07 DIAGNOSIS — R109 Unspecified abdominal pain: Secondary | ICD-10-CM | POA: Diagnosis present

## 2022-09-07 DIAGNOSIS — Z7982 Long term (current) use of aspirin: Secondary | ICD-10-CM

## 2022-09-07 DIAGNOSIS — K219 Gastro-esophageal reflux disease without esophagitis: Secondary | ICD-10-CM | POA: Diagnosis present

## 2022-09-07 DIAGNOSIS — E785 Hyperlipidemia, unspecified: Secondary | ICD-10-CM | POA: Diagnosis present

## 2022-09-07 DIAGNOSIS — Z79899 Other long term (current) drug therapy: Secondary | ICD-10-CM | POA: Diagnosis not present

## 2022-09-07 DIAGNOSIS — Z83438 Family history of other disorder of lipoprotein metabolism and other lipidemia: Secondary | ICD-10-CM | POA: Diagnosis not present

## 2022-09-07 DIAGNOSIS — F32A Depression, unspecified: Secondary | ICD-10-CM | POA: Diagnosis present

## 2022-09-07 DIAGNOSIS — D751 Secondary polycythemia: Secondary | ICD-10-CM | POA: Diagnosis present

## 2022-09-07 DIAGNOSIS — E669 Obesity, unspecified: Secondary | ICD-10-CM | POA: Insufficient documentation

## 2022-09-07 DIAGNOSIS — E782 Mixed hyperlipidemia: Secondary | ICD-10-CM | POA: Diagnosis present

## 2022-09-07 DIAGNOSIS — Z8249 Family history of ischemic heart disease and other diseases of the circulatory system: Secondary | ICD-10-CM

## 2022-09-07 DIAGNOSIS — Z880 Allergy status to penicillin: Secondary | ICD-10-CM

## 2022-09-07 DIAGNOSIS — I1A0 Resistant hypertension: Secondary | ICD-10-CM | POA: Diagnosis present

## 2022-09-07 DIAGNOSIS — I2510 Atherosclerotic heart disease of native coronary artery without angina pectoris: Secondary | ICD-10-CM | POA: Diagnosis present

## 2022-09-07 DIAGNOSIS — Z833 Family history of diabetes mellitus: Secondary | ICD-10-CM

## 2022-09-07 DIAGNOSIS — E781 Pure hyperglyceridemia: Secondary | ICD-10-CM | POA: Diagnosis present

## 2022-09-07 DIAGNOSIS — Z823 Family history of stroke: Secondary | ICD-10-CM

## 2022-09-07 DIAGNOSIS — R000 Tachycardia, unspecified: Secondary | ICD-10-CM | POA: Diagnosis present

## 2022-09-07 DIAGNOSIS — R0682 Tachypnea, not elsewhere classified: Secondary | ICD-10-CM | POA: Diagnosis present

## 2022-09-07 DIAGNOSIS — Z955 Presence of coronary angioplasty implant and graft: Secondary | ICD-10-CM

## 2022-09-07 LAB — LIPID PANEL
Cholesterol: 76 mg/dL (ref 0–200)
HDL: 27 mg/dL — ABNORMAL LOW (ref >40)
LDL Cholesterol: 19 mg/dL (ref 0–99)
Total CHOL/HDL Ratio: 2.8 RATIO
Triglycerides: 151 mg/dL — ABNORMAL HIGH (ref <150)
VLDL: 30 mg/dL (ref 0–40)

## 2022-09-07 LAB — COMPREHENSIVE METABOLIC PANEL
ALT: 38 U/L (ref 0–44)
AST: 28 U/L (ref 15–41)
Albumin: 3.9 g/dL (ref 3.5–5.0)
Alkaline Phosphatase: 78 U/L (ref 38–126)
Anion gap: 12 (ref 5–15)
BUN: 10 mg/dL (ref 6–20)
CO2: 14 mmol/L — ABNORMAL LOW (ref 22–32)
Calcium: 8.8 mg/dL — ABNORMAL LOW (ref 8.9–10.3)
Chloride: 108 mmol/L (ref 98–111)
Creatinine, Ser: 1.46 mg/dL — ABNORMAL HIGH (ref 0.61–1.24)
GFR, Estimated: >60 mL/min (ref >60)
Glucose, Bld: 152 mg/dL — ABNORMAL HIGH (ref 70–99)
Potassium: 3.9 mmol/L (ref 3.5–5.1)
Sodium: 134 mmol/L — ABNORMAL LOW (ref 135–145)
Total Bilirubin: 0.4 mg/dL (ref 0.3–1.2)
Total Protein: 7.3 g/dL (ref 6.5–8.1)

## 2022-09-07 LAB — CBC WITH DIFFERENTIAL/PLATELET
Abs Immature Granulocytes: 0.09 10*3/uL — ABNORMAL HIGH (ref 0.00–0.07)
Basophils Absolute: 0.1 10*3/uL (ref 0.0–0.1)
Basophils Relative: 1 %
Eosinophils Absolute: 0.3 10*3/uL (ref 0.0–0.5)
Eosinophils Relative: 3 %
HCT: 59.9 % — ABNORMAL HIGH (ref 39.0–52.0)
Hemoglobin: 20.4 g/dL — ABNORMAL HIGH (ref 13.0–17.0)
Immature Granulocytes: 1 %
Lymphocytes Relative: 19 %
Lymphs Abs: 2 10*3/uL (ref 0.7–4.0)
MCH: 27.6 pg (ref 26.0–34.0)
MCHC: 34.1 g/dL (ref 30.0–36.0)
MCV: 80.9 fL (ref 80.0–100.0)
Monocytes Absolute: 1.4 10*3/uL — ABNORMAL HIGH (ref 0.1–1.0)
Monocytes Relative: 14 %
Neutro Abs: 6.6 10*3/uL (ref 1.7–7.7)
Neutrophils Relative %: 62 %
Platelets: 338 10*3/uL (ref 150–400)
RBC: 7.4 MIL/uL — ABNORMAL HIGH (ref 4.22–5.81)
RDW: 14.6 % (ref 11.5–15.5)
WBC: 10.5 10*3/uL (ref 4.0–10.5)
nRBC: 0 % (ref 0.0–0.2)

## 2022-09-07 LAB — LACTIC ACID, PLASMA
Lactic Acid, Venous: 2.4 mmol/L (ref 0.5–1.9)
Lactic Acid, Venous: 1.7 mmol/L (ref 0.5–1.9)

## 2022-09-07 LAB — LIPASE, BLOOD: Lipase: 32 U/L (ref 11–51)

## 2022-09-07 LAB — MAGNESIUM: Magnesium: 1.8 mg/dL (ref 1.7–2.4)

## 2022-09-07 MED ORDER — HYDRALAZINE HCL 20 MG/ML IJ SOLN: 10.0000 mg | INTRAMUSCULAR | Status: DC | PRN

## 2022-09-07 MED ORDER — ONDANSETRON HCL 4 MG PO TABS: 4.0000 mg | ORAL_TABLET | Freq: Four times a day (QID) | ORAL | Status: DC | PRN

## 2022-09-07 MED ORDER — CALCIUM GLUCONATE-NACL 1-0.675 GM/50ML-% IV SOLN
1.0000 g | Freq: Once | INTRAVENOUS | Status: AC
  Administered 2022-09-07: 1000 mg via INTRAVENOUS
  Filled 2022-09-07: qty 50

## 2022-09-07 MED ORDER — HYDROMORPHONE HCL 1 MG/ML IJ SOLN
0.5000 mg | Freq: Once | INTRAMUSCULAR | Status: AC
  Administered 2022-09-07: 0.5 mg via INTRAVENOUS
  Filled 2022-09-07: qty 1

## 2022-09-07 MED ORDER — ICOSAPENT ETHYL 1 G PO CAPS
2.0000 g | ORAL_CAPSULE | Freq: Two times a day (BID) | ORAL | Status: DC
  Administered 2022-09-07 – 2022-09-10 (×6): 2 g via ORAL
  Filled 2022-09-07 (×7): qty 2

## 2022-09-07 MED ORDER — ACETAMINOPHEN 650 MG RE SUPP: 650.0000 mg | Freq: Four times a day (QID) | RECTAL | Status: DC | PRN

## 2022-09-07 MED ORDER — ONDANSETRON HCL 4 MG/2ML IJ SOLN: 4.0000 mg | Freq: Four times a day (QID) | INTRAMUSCULAR | Status: DC | PRN

## 2022-09-07 MED ORDER — ENOXAPARIN SODIUM 40 MG/0.4ML IJ SOSY
40.0000 mg | PREFILLED_SYRINGE | Freq: Every day | INTRAMUSCULAR | Status: DC
  Administered 2022-09-07 – 2022-09-09 (×3): 40 mg via SUBCUTANEOUS
  Filled 2022-09-07 (×3): qty 0.4

## 2022-09-07 MED ORDER — LACTATED RINGERS IV BOLUS
1000.0000 mL | Freq: Once | INTRAVENOUS | Status: AC
  Administered 2022-09-07: 1000 mL via INTRAVENOUS

## 2022-09-07 MED ORDER — SODIUM CHLORIDE 0.9% FLUSH
3.0000 mL | Freq: Two times a day (BID) | INTRAVENOUS | Status: DC
  Administered 2022-09-07 – 2022-09-08 (×2): 3 mL via INTRAVENOUS

## 2022-09-07 MED ORDER — AMLODIPINE BESYLATE 10 MG PO TABS
10.0000 mg | ORAL_TABLET | Freq: Every morning | ORAL | Status: DC
  Administered 2022-09-08 – 2022-09-10 (×3): 10 mg via ORAL
  Filled 2022-09-07 (×3): qty 1

## 2022-09-07 MED ORDER — ALBUTEROL SULFATE (2.5 MG/3ML) 0.083% IN NEBU: 2.5000 mg | INHALATION_SOLUTION | Freq: Four times a day (QID) | RESPIRATORY_TRACT | Status: DC | PRN

## 2022-09-07 MED ORDER — LACTATED RINGERS IV SOLN: INTRAVENOUS | Status: DC

## 2022-09-07 MED ORDER — ROSUVASTATIN CALCIUM 20 MG PO TABS
20.0000 mg | ORAL_TABLET | Freq: Every evening | ORAL | Status: DC
  Administered 2022-09-07 – 2022-09-09 (×3): 20 mg via ORAL
  Filled 2022-09-07 (×3): qty 1

## 2022-09-07 MED ORDER — DICYCLOMINE HCL 20 MG PO TABS
20.0000 mg | ORAL_TABLET | Freq: Three times a day (TID) | ORAL | Status: DC
  Administered 2022-09-07 – 2022-09-10 (×8): 20 mg via ORAL
  Filled 2022-09-07 (×8): qty 1

## 2022-09-07 MED ORDER — ACETAMINOPHEN 325 MG PO TABS
650.0000 mg | ORAL_TABLET | Freq: Four times a day (QID) | ORAL | Status: DC | PRN
  Administered 2022-09-09: 650 mg via ORAL
  Filled 2022-09-07: qty 2

## 2022-09-07 MED ORDER — SACCHAROMYCES BOULARDII 250 MG PO CAPS
250.0000 mg | ORAL_CAPSULE | Freq: Two times a day (BID) | ORAL | Status: DC
  Administered 2022-09-07 – 2022-09-10 (×7): 250 mg via ORAL
  Filled 2022-09-07 (×7): qty 1

## 2022-09-07 MED ORDER — ASPIRIN 81 MG PO TBEC
81.0000 mg | DELAYED_RELEASE_TABLET | Freq: Every evening | ORAL | Status: DC
  Administered 2022-09-07 – 2022-09-09 (×3): 81 mg via ORAL
  Filled 2022-09-07 (×3): qty 1

## 2022-09-07 MED ORDER — METOPROLOL SUCCINATE ER 50 MG PO TB24
50.0000 mg | ORAL_TABLET | Freq: Every morning | ORAL | Status: DC
  Administered 2022-09-08 – 2022-09-10 (×3): 50 mg via ORAL
  Filled 2022-09-07 (×3): qty 1

## 2022-09-07 NOTE — ED Provider Notes (Signed)
[facility] EMERGENCY DEPARTMENT Provider Note   CSN: [id number] Arrival date & time: 09/07/22  2130     History {Add pertinent medical, surgical, social history, OB history to HPI:1} Chief Complaint  Patient presents with   Diarrhea    Richard Hull is a 47 y.o. male with HTN, CAD, dyslipidemia, and nephrolithiasis who presents with ongoing diarrhea.   Pt arrived to the ED complaining of diarrhea, was admitted to [facility] for same and discharged yesterday was feeling better when he left but then started to have abdominal pain and diarrhea several hours later.    Per 09/05/22 discharge summary:  CT Abdo/pelvis with diffuse colonic gas/fluid levels consistent with history of diarrhea, bowel obstruction ileus.  IV fluid bolus given, 4 mg IV morphine given in the ED.  TRH consulted for admission for further evaluation management of acute viral gastroenteritis.  Patient was treated with supportive care, C. difficile assessed and negative, GI pathogen panel is still pending but patient's symptoms have improved, he is tolerating regular diet without any nausea vomiting, no diarrhea, no abdominal pain so he is going to be discharged today.   Patient reports he started ozempic 6 months ago but hasn't taken it in 2 weeks d/t GI distress. Denies f/c, CP, SOB, leg swelling. Denies dysuria/hematuria but does endorse oliguria. Had a colonoscopy 3 weeks ago and was reported everything was fine. C diff and stool culture panel was negative at admission.   Diarrhea      Home Medications Prior to Admission medications   Medication Sig Start Date End Date Taking? Authorizing Provider  amLODipine (NORVASC) 10 MG tablet Take 1 tablet by mouth daily.    [provider]  aspirin EC 81 MG EC tablet Take 1 tablet (81 mg total) by mouth daily. Patient taking differently: Take 81 mg by mouth every evening. 09/18/15   Brett Canales, PA-C  icosapent Ethyl (VASCEPA) 1 g capsule Take 2  capsules (2 g total) by mouth 2 (two) times daily. 02/27/22   Troy Sine, MD  inclisiran [place] Downer) 284 MG/1.5ML SOSY injection as directed Subcutaneous every 6 months 05/10/21   [provider]  metoprolol succinate (TOPROL-XL) 50 MG 24 hr tablet Take 1 tablet (50 mg total) by mouth daily. Take with or immediately following a meal. 01/08/22 01/03/23  Troy Sine, MD  nitroGLYCERIN (NITROSTAT) 0.4 MG SL tablet Place 1 tablet (0.4 mg total) under the tongue every 5 (five) minutes as needed for chest pain (CP or SOB). 07/27/20   Troy Sine, MD  OZEMPIC, 0.25 OR 0.5 MG/DOSE, 2 MG/3ML SOPN Inject 0.5mg  x 4 weeks after doing 0.25mg  dosing x 4 weeks as directed Subcutaneous as directed for 30 days 05/15/22   [provider]  rosuvastatin (CRESTOR) 20 MG tablet Take 1 tablet (20 mg total) by mouth daily. 02/27/22   Troy Sine, MD  valsartan (DIOVAN) 160 MG tablet Take 1 tablet (160 mg total) by mouth daily. 07/03/22   Troy Sine, MD      Allergies    Amoxicillin and Penicillins    Review of Systems   Review of Systems  Gastrointestinal:  Positive for diarrhea.   Review of systems Negative for melena/hematochezia, .  A 10 point review of systems was performed and is negative unless otherwise reported in HPI.  Physical Exam Updated Vital Signs BP (!) 143/89 (BP Location: Right Arm)   Pulse (!) 133   Temp 97.8 F (36.6 C) (Oral)  Resp (!) 22   Ht 5\' 9"  (1.753 m)   Wt 108 kg   SpO2 99%   BMI 35.16 kg/m  Physical Exam General: Normal appearing male, lying in bed.  HEENT: PERRLA, Sclera anicteric, MMM, trachea midline.  Cardiology: RRR, no murmurs/rubs/gallops. BL radial and DP pulses equal bilaterally.  Resp: Normal respiratory rate and effort. CTAB, no wheezes, rhonchi, crackles.  Abd: Soft, non-tender, non-distended. No rebound tenderness or guarding.  GU: Deferred. MSK: No peripheral edema or signs of trauma. Extremities without deformity or TTP. No  cyanosis or clubbing. Skin: warm, dry. No rashes or lesions. Back: No CVA tenderness Neuro: A&Ox4, CNs II-XII grossly intact. MAEs. Sensation grossly intact.  Psych: Normal mood and affect.   ED Results / Procedures / Treatments   Labs (all labs ordered are listed, but only abnormal results are displayed) Labs Reviewed - No data to display  EKG None  Radiology No results found.  Procedures Procedures  {Document cardiac monitor, telemetry assessment procedure when appropriate:1}  Medications Ordered in ED Medications  lactated ringers bolus 1,000 mL (0 mLs Intravenous Stopped 09/07/22 1219)  lactated ringers bolus 1,000 mL (0 mLs Intravenous Stopped 09/07/22 1219)  HYDROmorphone (DILAUDID) injection 0.5 mg (0.5 mg Intravenous Given 09/07/22 0908)  calcium gluconate 1 g/ 50 mL sodium chloride IVPB (0 mg Intravenous Stopping previously hung infusion 09/07/22 1310)  potassium chloride (KLOR-CON M) CR tablet 30 mEq (30 mEq Oral Given 09/08/22 1201)  potassium chloride (KLOR-CON M) CR tablet 30 mEq (30 mEq Oral Given 09/09/22 1400)  magnesium sulfate IVPB 2 g 50 mL (2 g Intravenous New Bag/Given 09/09/22 1308)    ED Course/ Medical Decision Making/ A&P                          Medical Decision Making Amount and/or Complexity of Data Reviewed Labs: ordered. Decision-making details documented in ED Course.  Risk Prescription drug management. Decision regarding hospitalization.    This patient presents to the ED for concern of diarrhea and dehydration, this involves an extensive number of treatment options, and is a complaint that carries with it a high risk of complications and morbidity.  I considered the following differential and admission for this acute, potentially life threatening condition.   MDM:    For patient's diarrhea, consider dehydration and volume losses as patient is significantly tachycardic to 130s-140s on presentation, consider early hypovolemic shock and will  fluid resuscitate.   Clinical Course as of 09/07/22 1724  Fri Sep 07, 2022  0854 Hemoglobin(!): 20.4 Dehydration [HN]  0854 WBC: 10.5 [HN]  0855 CO2(!): 14 NAGMA from diarrhea [HN]  0855 Creatinine(!): 1.46 AKI [HN]  0855 HR improved to 110s after 1L IVF, will give a 2nd [HN]  1040 Lactic Acid, Venous(!!): 2.4 Receiving IVF [HN]    Clinical Course User Index [HN] Audley Hose, MD    Labs: I Ordered, and personally interpreted labs.  The pertinent results include:  those listed above  Additional history obtained from chart review, wife at bedside.  Cardiac Monitoring: The patient was maintained on a cardiac monitor.  I personally viewed and interpreted the cardiac monitored which showed an underlying rhythm of: Sinus tachycardia  Reevaluation: After the interventions noted above, I reevaluated the patient and found that they have :improved  Social Determinants of Health:  Patient lives independently   Disposition:  *** Admit   Co morbidities that complicate the patient evaluation  Past Medical History:  Diagnosis  Date   Cervical radiculopathy at C5 01/27/2016   left   Coronary artery disease 08/2015   Depression    GERD (gastroesophageal reflux disease)    History of kidney stones    Hyperlipidemia LDL goal <70    Hypertension    Hypertriglyceridemia    Left arm weakness    Myocardial infarction (HCC) 08/2015   OCD (obsessive compulsive disorder)    Sleep apnea      Medicines No orders of the defined types were placed in this encounter.   I have reviewed the patients home medicines and have made adjustments as needed  Problem List / ED Course: Problem List Items Addressed This Visit       Other   * (Principal) Diarrhea - Primary         {Document critical care time when appropriate:1} {Document review of labs and clinical decision tools ie heart score, Chads2Vasc2 etc:1}  {Document your independent review of radiology images, and any outside  records:1} {Document your discussion with family members, caretakers, and with consultants:1} {Document social determinants of health affecting pt's care:1} {Document your decision making why or why not admission, treatments were needed:1}  This note was created using dictation software, which may contain spelling or grammatical errors.

## 2022-09-07 NOTE — H&P (Signed)
History and Physical    Patient: Richard Hull XBJ:[id number] DOB: 03-Mar-1976 DOA: 09/07/2022 DOS: the patient was seen and examined on 09/07/2022 PCP: [name], MD  Patient coming from: Home  Chief Complaint:  Chief Complaint  Patient presents with   Diarrhea   HPI: Richard Hull is a 47 y.o. male with medical history significant of hypertension, CAD, dyslipidemia, and nephrolithiasis presents with complaints of continued diarrhea. Patient had just recently been hospitalized at [facility] from 1/15 -1/17 with acute renal failure secondary after initially presenting with complaints of nausea, vomiting, and diarrhea.  He had negative C. difficile and GI panel on 1/16.  CT scan abdomen pelvis noted diffuse colonic gas fluid levels consistent with diarrhea.  Symptoms were thought secondary to like a viral gastroenteritis.  Seem to be doing better initially after getting home but soon thereafter reported recurrence of symptoms.  He complains of having crampy abdominal pain epigastrically and around his waistline.  He reports having 20 stools on per day.  Denies having any blood present in stools.  At home he had tried [employer] colon health and Imodium without relief of symptoms.  He anytime he tries to eat it makes him nauseous, but he has not had any recurrence of vomiting symptoms since about a week ago.  Patient has noted some decreased urine output.  He had just recently had a routine screening called an ostomy about 3 weeks ago with [facility] for which he was told it showed no abnormality.  Denied any recent antibiotics or medication changes in the last 3 months.  He does report that his son has Crohn's disease.  In the emergency department patient was noted to be afebrile with heart rates elevated up to 133, respirations 15-25, blood pressures 127/92 to 156/102, and all other vital signs maintained.  Labs significant for hemoglobin 20.4, sodium 134, BUN 10, creatinine 1.46, and  calcium 8.8.  Patient has been given 1 L lactated Ringer's, Dilaudid 0.1 mg IV x 1 dose.  Review of Systems: As mentioned in the history of present illness. All other systems reviewed and are negative. Past Medical History:  Diagnosis Date   Cervical radiculopathy at C5 01/27/2016   left   Coronary artery disease 08/2015   Depression    GERD (gastroesophageal reflux disease)    History of kidney stones    Hyperlipidemia LDL goal <70    Hypertension    Hypertriglyceridemia    Left arm weakness    Myocardial infarction (HCC) 08/2015   OCD (obsessive compulsive disorder)    Sleep apnea    Past Surgical History:  Procedure Laterality Date   ANTERIOR CERVICAL DECOMP/DISCECTOMY FUSION N/A 05/01/2017   Procedure: Anterior Cervical Decompression Fusion Cervical Four-Five Cervical Five-Six;  Surgeon: Shirlean Kelly, MD;  Location: [facility] OR;  Service: Neurosurgery;  Laterality: N/A;  Anterior Cervical Decompression Fusion Cervical Four-Five Cervical Five-Six   CARDIAC CATHETERIZATION N/A 09/15/2015   Procedure: Left Heart Cath and Coronary Angiography;  Surgeon: Lennette Bihari, MD; LAD 30%, pRCA 30%, dRCA 30%, RPDA 99%, nl LV function    CARDIAC CATHETERIZATION N/A 09/15/2015   Procedure: Coronary Stent Intervention;  Surgeon: Lennette Bihari, MD; 3.515 mm Xience Alpine DES to dRCA/RPDA>> 5% residual      LEFT HEART CATH AND CORONARY ANGIOGRAPHY N/A 11/28/2016   Procedure: Left Heart Cath and Coronary Angiography;  Surgeon: Lennette Bihari, MD;  Location: [facility] INVASIVE CV LAB;  Service: Cardiovascular;  Laterality: N/A;   TONSILLECTOMY  Social History:  reports that he has never smoked. He has never used smokeless tobacco. He reports current alcohol use. He reports that he does not use drugs.  Allergies  Allergen Reactions   Benicar [Olmesartan] Other (See Comments)    Metallic taste   Amoxil [Amoxicillin] Rash   Penicillins Rash and Other (See Comments)    Has patient had a PCN reaction  causing immediate rash, facial/tongue/throat swelling, SOB or lightheadedness with hypotension: Yes Has patient had a PCN reaction causing severe rash involving mucus membranes or skin necrosis: No Has patient had a PCN reaction that required hospitalization No Has patient had a PCN reaction occurring within the last 10 years: No If all of the above answers are "NO", then may proceed with Cephalosporin use.     Family History  Problem Relation Age of Onset   Heart attack Father 63       approx age of first stent   Stroke Father    Hypertension Father    Skin cancer Mother    Hyperlipidemia Sister    Stroke Paternal Grandmother    Hypertension Paternal Grandmother    Hypertension Maternal Grandmother    Hypertension Maternal Grandfather    Diabetes Maternal Grandfather    Hypertension Paternal Grandfather    Diabetes Paternal Grandfather    Heart disease Paternal Grandfather     Prior to Admission medications   Medication Sig Start Date End Date Taking? Authorizing Provider  amLODipine (NORVASC) 10 MG tablet Take 1 tablet by mouth in the morning.   Yes [provider]  aspirin EC 81 MG EC tablet Take 1 tablet (81 mg total) by mouth daily. Patient taking differently: Take 81 mg by mouth every evening. 09/18/15  Yes Brett Canales, PA-C  icosapent Ethyl (VASCEPA) 1 g capsule Take 2 capsules (2 g total) by mouth 2 (two) times daily. 02/27/22  Yes Troy Sine, MD  inclisiran [facility]) 284 MG/1.5ML SOSY injection as directed Subcutaneous every 6 months 05/10/21  Yes [provider]  metoprolol succinate (TOPROL-XL) 50 MG 24 hr tablet Take 1 tablet (50 mg total) by mouth daily. Take with or immediately following a meal. Patient taking differently: Take 50 mg by mouth in the morning. 01/08/22 01/03/23 Yes Troy Sine, MD  nitroGLYCERIN (NITROSTAT) 0.4 MG SL tablet Place 1 tablet (0.4 mg total) under the tongue every 5 (five) minutes as needed for chest pain (CP or SOB).  07/27/20  Yes Troy Sine, MD  OZEMPIC, 0.25 OR 0.5 MG/DOSE, 2 MG/3ML SOPN Inject 0.5 mg into the skin every Friday. 05/15/22  Yes [provider]  Probiotic Product (PROBIOTIC PO) Take 1 capsule by mouth daily as needed (stomach discomfort).   Yes [provider]  rosuvastatin (CRESTOR) 20 MG tablet Take 1 tablet (20 mg total) by mouth daily. Patient taking differently: Take 20 mg by mouth every evening. 02/27/22  Yes Troy Sine, MD  valsartan (DIOVAN) 160 MG tablet Take 1 tablet (160 mg total) by mouth daily. Patient taking differently: Take 160 mg by mouth in the morning. 07/03/22  Yes Troy Sine, MD    Physical Exam: Vitals:   09/07/22 0930 09/07/22 0945 09/07/22 1000 09/07/22 1015  BP: (!) 138/96 (!) 137/92 (!) 144/89 (!) 127/92  Pulse: (!) 107 (!) 107 (!) 108 (!) 106  Resp: 16 16 18  (!) 25  Temp:      TempSrc:      SpO2: 98% 98% 99% 99%  Weight:  Height:       Exam  Constitutional: Middle-age male currently in no acute distress Eyes: PERRL, lids and conjunctivae normal ENMT: Mucous membranes are moist. Posterior pharynx clear of any exudate or lesions.Normal dentition.  Neck: normal, supple  Respiratory: clear to auscultation bilaterally, no wheezing, no crackles. Normal respiratory effort. No accessory muscle use.  Cardiovascular: Regular rate and rhythm, no murmurs / rubs / gallops. No extremity edema.   No carotid bruits.  Abdomen: Mild tenderness to palpation epigastrically appreciated.  Bowel sounds present all 4 quadrants. Musculoskeletal: no clubbing / cyanosis. No joint deformity upper and lower extremities. Good ROM, no contractures. Normal muscle tone.  Skin: no rashes, lesions, ulcers.  Poor skin turgor. Neurologic: CN 2-12 grossly intact. Sensation intact, DTR normal. Strength 5/5 in all 4.  Psychiatric: Normal judgment and insight. Alert and oriented x 3. Normal mood.   Data Reviewed:  Reviewed labs, imaging, and pertinent  records as noted above in HPI.  Assessment and Plan:  Diarrhea Abdominal pain Unresolved.  Patient had recently been hospitalized at [facility] after having nausea, vomiting, and diarrhea 1/15-1/17.    CT scan of the abdomen pelvis from 1/15 noted diffuse colonic gas and fluid levels consistent with history of diarrhea. Vomiting had stopped, but nausea and diarrhea persisted along with crampy abdominal pain. Symptoms were thought secondary to a possible viral gastroenteritis as C. difficile and GI panel were negative.  Patient denies any recent medication changes which could be the cause of symptoms. -Admit to a telemetry bed -Strict I&O's and daily weights -Recheck GI panel -Probiotics -Gastroenterology consulted, we will follow-up for any further recommendations  Renal insufficiency secondary to dehydration Acute.  Patient presents with creatinine elevated up to 1.46 with BUN 10.  Creatinine during last hospitalization had been noted to be 1.25 when discharged from the hospital on 1/17.  Suspect patient is volume depleted due to persistent diarrhea and poor p.o. intake. -Continue normal saline IV fluids -Hold possible nephrotoxic agents -Check kidney function in a.m.  SIRS Lactic acidosis Resolved.  Patient was noted to be tachycardic and tachypneic meeting SIRS criteria.  Initial lactic acid elevated 2.4.  Suspect secondary to dehydration to as repeat check after IV fluids within normal limits at 1.7, but question possibility of infectious cause of patient's symptoms. -Continue to monitor  Polycythemia Hemoglobin elevated at 20.4.  Suspect secondary to hemoconcentration in the setting of significant dehydration. -Recheck CBC tomorrow morning  Essential hypertension Blood pressures currently maintained. -Continue amlodipine, metoprolol -Held valsartan due to renal insufficiency  CAD Prior history of PCI with stent placement in 2017. -Continue  statin  Hyperlipidemia -Continue Crestor  Obesity BMI 35.16 kg/m DVT prophylaxis: Lovenox Advance Care Planning:   Code Status: Full Code  Consults: GI  Family Communication: Wife updated at bedside  Severity of Illness: The appropriate patient status for this patient is INPATIENT. Inpatient status is judged to be reasonable and necessary in order to provide the required intensity of service to ensure the patient's safety. The patient's presenting symptoms, physical exam findings, and initial radiographic and laboratory data in the context of their chronic comorbidities is felt to place them at high risk for further clinical deterioration. Furthermore, it is not anticipated that the patient will be medically stable for discharge from the hospital within 2 midnights of admission.   * I certify that at the point of admission it is my clinical judgment that the patient will require inpatient hospital care spanning beyond 2 midnights from  the point of admission due to high intensity of service, high risk for further deterioration and high frequency of surveillance required.*  Author: Clydie Braun, MD 09/07/2022 11:08 AM  For on call review [email or web address].

## 2022-09-07 NOTE — Consult Note (Signed)
Referring Provider:  [place] Primary Care Physician:  Shon Baton, MD Primary Gastroenterologist:  Dr. Vreeland/[facility]  Reason for Consultation: Persistent diarrhea  HPI: Richard Hull is a 47 y.o. male with past medical history of coronary artery disease, sleep apnea, hypertension and GERD presented to the hospital this morning with ongoing diarrhea.  She was admitted to the hospital few days ago with similar symptoms of abdominal pain, vomiting and diarrhea.  GI pathogen panel and C. difficile PCR was negative.  CT abdomen pelvis with contrast on January 15 showed diffuse colonic gas consistent with history of diarrhea .  he was diagnosed with viral gastroenteritis and was subsequently discharged home on September 05, 2022.  Patient continues to have diarrhea with multiple bowel movements per day and decided to come back to the hospital.  He had more than 20 movement yesterday.  Only 1 bowel movement since admission.  Complaining of abdominal cramps.   normal colonoscopy on August 15, 2022.     Past Medical History:  Diagnosis Date   Cervical radiculopathy at C5 01/27/2016   left   Coronary artery disease 08/2015   Depression    GERD (gastroesophageal reflux disease)    History of kidney stones    Hyperlipidemia LDL goal <70    Hypertension    Hypertriglyceridemia    Left arm weakness    Myocardial infarction ([place]) 08/2015   OCD (obsessive compulsive disorder)    Sleep apnea     Past Surgical History:  Procedure Laterality Date   ANTERIOR CERVICAL DECOMP/DISCECTOMY FUSION N/A 05/01/2017   Procedure: Anterior Cervical Decompression Fusion Cervical Four-Five Cervical Five-Six;  Surgeon: Jovita Gamma, MD;  Location: [facility];  Service: Neurosurgery;  Laterality: N/A;  Anterior Cervical Decompression Fusion Cervical Four-Five Cervical Five-Six   CARDIAC CATHETERIZATION N/A 09/15/2015   Procedure: Left Heart Cath and Coronary Angiography;  Surgeon: Troy Sine, MD; LAD 30%, pRCA 30%,  dRCA 30%, RPDA 99%, nl LV function    CARDIAC CATHETERIZATION N/A 09/15/2015   Procedure: Coronary Stent Intervention;  Surgeon: Troy Sine, MD; 3.515 mm Xience Alpine DES to dRCA/RPDA>> 5% residual      LEFT HEART CATH AND CORONARY ANGIOGRAPHY N/A 11/28/2016   Procedure: Left Heart Cath and Coronary Angiography;  Surgeon: Troy Sine, MD;  Location: [facility] [facility];  Service: Cardiovascular;  Laterality: N/A;   TONSILLECTOMY      Prior to Admission medications   Medication Sig Start Date End Date Taking? Authorizing Provider  amLODipine (NORVASC) 10 MG tablet Take 1 tablet by mouth in the morning.   Yes [provider]  aspirin EC 81 MG EC tablet Take 1 tablet (81 mg total) by mouth daily. Patient taking differently: Take 81 mg by mouth every evening. 09/18/15  Yes Brett Canales, PA-C  icosapent Ethyl (VASCEPA) 1 g capsule Take 2 capsules (2 g total) by mouth 2 (two) times daily. 02/27/22  Yes Troy Sine, MD  inclisiran [facility]) 284 MG/1.5ML SOSY injection as directed Subcutaneous every 6 months 05/10/21  Yes [provider]  metoprolol succinate (TOPROL-XL) 50 MG 24 hr tablet Take 1 tablet (50 mg total) by mouth daily. Take with or immediately following a meal. Patient taking differently: Take 50 mg by mouth in the morning. 01/08/22 01/03/23 Yes Troy Sine, MD  nitroGLYCERIN (NITROSTAT) 0.4 MG SL tablet Place 1 tablet (0.4 mg total) under the tongue every 5 (five) minutes as needed for chest pain (CP or SOB). 07/27/20  Yes Claiborne Billings,  Joyice Faster, MD  OZEMPIC, 0.25 OR 0.5 MG/DOSE, 2 MG/3ML SOPN Inject 0.5 mg into the skin every Friday. 05/15/22  Yes [provider]  Probiotic Product (PROBIOTIC PO) Take 1 capsule by mouth daily as needed (stomach discomfort).   Yes [provider]  rosuvastatin (CRESTOR) 20 MG tablet Take 1 tablet (20 mg total) by mouth daily. Patient taking differently: Take 20 mg by mouth every evening. 02/27/22  Yes Troy Sine,  MD  valsartan (DIOVAN) 160 MG tablet Take 1 tablet (160 mg total) by mouth daily. Patient taking differently: Take 160 mg by mouth in the morning. 07/03/22  Yes Troy Sine, MD    Scheduled Meds:  enoxaparin (LOVENOX) injection  40 mg Subcutaneous Daily   saccharomyces boulardii  250 mg Oral BID   sodium chloride flush  3 mL Intravenous Q12H   Continuous Infusions:  lactated ringers 125 mL/hr at 09/07/22 1201   PRN Meds:.acetaminophen **OR** acetaminophen, albuterol, hydrALAZINE, ondansetron **OR** ondansetron (ZOFRAN) IV  Allergies as of 09/07/2022 - Review Complete 09/07/2022  Allergen Reaction Noted   Benicar [olmesartan] Other (See Comments) 09/07/2022   Amoxil [amoxicillin] Rash 03/08/2014   Penicillins Rash and Other (See Comments) 03/08/2014    Family History  Problem Relation Age of Onset   Heart attack Father 66       approx age of first stent   Stroke Father    Hypertension Father    Skin cancer Mother    Hyperlipidemia Sister    Stroke Paternal Grandmother    Hypertension Paternal Grandmother    Hypertension Maternal Grandmother    Hypertension Maternal Grandfather    Diabetes Maternal Grandfather    Hypertension Paternal Grandfather    Diabetes Paternal Grandfather    Heart disease Paternal Grandfather     Social History   Socioeconomic History   Marital status: Legally Separated    Spouse name: Not on file   Number of children: 3   Years of education: Some Coll   Highest education level: Not on file  Occupational History   Occupation: Lawyer: [employer]  Tobacco Use   Smoking status: Never   Smokeless tobacco: Never  Vaping Use   Vaping Use: Never used  Substance and Sexual Activity   Alcohol use: Yes    Alcohol/week: 0.0 standard drinks of alcohol    Comment: Rarely   Drug use: No   Sexual activity: Yes  Other Topics Concern   Not on file  Social History Narrative   Multiple family members on his father's  side have/had CAD at a young age, uncles and grandparents.   Lives at home with his wife and three children.   Right-handed.   2-3 cups caffeine per week.   Social Determinants of Health   Financial Resource Strain: Not on file  Food Insecurity: No Food Insecurity (09/04/2022)   Hunger Vital Sign    Worried About Running Out of Food in the Last Year: Never true    Ran Out of Food in the Last Year: Never true  Transportation Needs: No Transportation Needs (09/04/2022)   PRAPARE - Hydrologist (Medical): No    Lack of Transportation (Non-Medical): No  Physical Activity: Not on file  Stress: Not on file  Social Connections: Not on file  Intimate Partner Violence: Not At Risk (09/04/2022)   Humiliation, Afraid, Rape, and Kick questionnaire    Fear of Current or Ex-Partner: No  Emotionally Abused: No    Physically Abused: No    Sexually Abused: No    Review of Systems: All negative except as stated above in HPI.  Physical Exam: Vital signs: Vitals:   09/07/22 1015 09/07/22 1130  BP: (!) 127/92 (!) 141/92  Pulse: (!) 106 (!) 107  Resp: (!) 25 18  Temp:  99.5 F (37.5 C)  SpO2: 99% 99%     General:   Alert,  Well-developed, well-nourished, pleasant and cooperative in NAD HEENT -normocephalic, atraumatic, extraocular Lungs:  Clear throughout to auscultation.   No wheezes, crackles, or rhonchi. No acute distress. Heart:  Regular rate and rhythm; no murmurs, clicks, rubs,  or gallops. Abdomen: Soft, nontender nondistended, bowel sounds present, no peritoneal signs. Lower extremity -no edema Neuro -alert and oriented x 3 Psych -mood and affect normal Rectal:  Deferred  GI:  Lab Results: Recent Labs    09/05/22 0313 09/07/22 0731  WBC 5.6 10.5  HGB 15.9 20.4*  HCT 48.7 59.9*  PLT 234 338   BMET Recent Labs    09/05/22 0313 09/07/22 0731  NA 138 134*  K 3.8 3.9  CL 107 108  CO2 24 14*  GLUCOSE 114* 152*  BUN 21* 10  CREATININE  1.25* 1.46*  CALCIUM 8.4* 8.8*   LFT Recent Labs    09/07/22 0731  PROT 7.3  ALBUMIN 3.9  AST 28  ALT 38  ALKPHOS 78  BILITOT 0.4   PT/INR No results for input(s): "LABPROT", "INR" in the last 72 hours.   Studies/Results: No results found.  Impression/Plan: -Persistent diarrhea for last 1 week.  GI pathogen panel is negative few days ago.  Multiple bowel movement yesterday but only 1 bowel movement since admission.  Recommendations -------------------------- -Most likely viral gastroenteritis given negative GI pathogen panel and C. difficile.  Possible postinfectious irritable bowel syndrome as well because of abdominal cramps. -Start dicyclomine.  Continue Florastor. -if Ongoing diarrhea, consider cholestyramine. -Advance diet as tolerated, GI will follow.    LOS: 0 days   Kathi Der  MD, FACP 09/07/2022, 2:52 PM  Contact #  [phone]

## 2022-09-07 NOTE — ED Triage Notes (Signed)
Pt arrived to the ED complaining of diarrhea, was admitted to [facility] for same and discharged yesterday was feeling better when he left but then started to have abdominal pain and diarrhea several hours later.   Denies n/v today

## 2022-09-07 NOTE — ED Notes (Signed)
ED TO INPATIENT HANDOFF REPORT  ED Nurse Name and Phone #:  Lorin Picket [phone]  S Name/Age/Gender [name] 47 y.o. male Room/Bed: 044C/044C  Code Status   Code Status: Full Code  Home/SNF/Other Home Patient oriented to: self, place, time, and situation Is this baseline? Yes   Triage Complete: Triage complete  Chief Complaint Diarrhea [R19.7]  Triage Note Pt arrived to the ED complaining of diarrhea, was admitted to [facility] for same and discharged yesterday was feeling better when he left but then started to have abdominal pain and diarrhea several hours later.   Denies n/v today    Allergies Allergies  Allergen Reactions   Benicar [Olmesartan] Other (See Comments)    Metallic taste   Amoxil [Amoxicillin] Rash   Penicillins Rash and Other (See Comments)    Has patient had a PCN reaction causing immediate rash, facial/tongue/throat swelling, SOB or lightheadedness with hypotension: Yes Has patient had a PCN reaction causing severe rash involving mucus membranes or skin necrosis: No Has patient had a PCN reaction that required hospitalization No Has patient had a PCN reaction occurring within the last 10 years: No If all of the above answers are "NO", then may proceed with Cephalosporin use.     Level of Care/Admitting Diagnosis ED Disposition     ED Disposition  Admit   Condition  --   Comment  Hospital Area: [facility] [[id number]]  Level of Care: Med-Surg [16]  May admit patient to [facility] or [facility] if equivalent level of care is available:: No  Covid Evaluation: Asymptomatic - no recent exposure (last 10 days) testing not required  Diagnosis: Diarrhea [787.91.ICD-9-CM]  Admitting Physician: Clydie Braun [[id number]]  Attending Physician: Clydie Braun [[id number]]  Certification:: I certify this patient will need inpatient services for at least 2 midnights  Estimated Length of Stay: 2          B Medical/Surgery History Past  Medical History:  Diagnosis Date   Cervical radiculopathy at C5 01/27/2016   left   Coronary artery disease 08/2015   Depression    GERD (gastroesophageal reflux disease)    History of kidney stones    Hyperlipidemia LDL goal <70    Hypertension    Hypertriglyceridemia    Left arm weakness    Myocardial infarction (HCC) 08/2015   OCD (obsessive compulsive disorder)    Sleep apnea    Past Surgical History:  Procedure Laterality Date   ANTERIOR CERVICAL DECOMP/DISCECTOMY FUSION N/A 05/01/2017   Procedure: Anterior Cervical Decompression Fusion Cervical Four-Five Cervical Five-Six;  Surgeon: Shirlean Kelly, MD;  Location: [facility] OR;  Service: Neurosurgery;  Laterality: N/A;  Anterior Cervical Decompression Fusion Cervical Four-Five Cervical Five-Six   CARDIAC CATHETERIZATION N/A 09/15/2015   Procedure: Left Heart Cath and Coronary Angiography;  Surgeon: Lennette Bihari, MD; LAD 30%, pRCA 30%, dRCA 30%, RPDA 99%, nl LV function    CARDIAC CATHETERIZATION N/A 09/15/2015   Procedure: Coronary Stent Intervention;  Surgeon: Lennette Bihari, MD; 3.515 mm Xience Alpine DES to dRCA/RPDA>> 5% residual      LEFT HEART CATH AND CORONARY ANGIOGRAPHY N/A 11/28/2016   Procedure: Left Heart Cath and Coronary Angiography;  Surgeon: Lennette Bihari, MD;  Location: [facility] INVASIVE CV LAB;  Service: Cardiovascular;  Laterality: N/A;   TONSILLECTOMY       A IV Location/Drains/Wounds Patient Lines/Drains/Airways Status     Active Line/Drains/Airways     Name Placement date Placement time Site Days   Peripheral  IV 09/07/22 20 G Posterior;Right Hand 09/07/22  0832  Hand  less than 1            Intake/Output Last 24 hours  Intake/Output Summary (Last 24 hours) at 09/07/2022 1337 Last data filed at 09/07/2022 1219 Gross per 24 hour  Intake 2000 ml  Output --  Net 2000 ml    Labs/Imaging Results for orders placed or performed during the hospital encounter of 09/07/22 (from the past 48 hour(s))  CBC with  Differential     Status: Abnormal   Collection Time: 09/07/22  7:31 AM  Result Value Ref Range   WBC 10.5 4.0 - 10.5 K/uL   RBC 7.40 (H) 4.22 - 5.81 MIL/uL   Hemoglobin 20.4 (H) 13.0 - 17.0 g/dL   HCT 59.9 (H) 39.0 - 52.0 %    Comment: REPEATED TO VERIFY   MCV 80.9 80.0 - 100.0 fL   MCH 27.6 26.0 - 34.0 pg   MCHC 34.1 30.0 - 36.0 g/dL   RDW 14.6 11.5 - 15.5 %   Platelets 338 150 - 400 K/uL    Comment: REPEATED TO VERIFY   nRBC 0.0 0.0 - 0.2 %   Neutrophils Relative % 62 %   Neutro Abs 6.6 1.7 - 7.7 K/uL   Lymphocytes Relative 19 %   Lymphs Abs 2.0 0.7 - 4.0 K/uL   Monocytes Relative 14 %   Monocytes Absolute 1.4 (H) 0.1 - 1.0 K/uL   Eosinophils Relative 3 %   Eosinophils Absolute 0.3 0.0 - 0.5 K/uL   Basophils Relative 1 %   Basophils Absolute 0.1 0.0 - 0.1 K/uL   WBC Morphology MILD LEFT SHIFT (1-5% METAS, OCC MYELO, OCC BANDS)    RBC Morphology MORPHOLOGY UNREMARKABLE    Smear Review MORPHOLOGY UNREMARKABLE    Immature Granulocytes 1 %   Abs Immature Granulocytes 0.09 (H) 0.00 - 0.07 K/uL    Comment: Performed at [facility], [address], [place], [place] [address]  Comprehensive metabolic panel     Status: Abnormal   Collection Time: 09/07/22  7:31 AM  Result Value Ref Range   Sodium 134 (L) 135 - 145 mmol/L   Potassium 3.9 3.5 - 5.1 mmol/L   Chloride 108 98 - 111 mmol/L   CO2 14 (L) 22 - 32 mmol/L   Glucose, Bld 152 (H) 70 - 99 mg/dL    Comment: Glucose reference range applies only to samples taken after fasting for at least 8 hours.   BUN 10 6 - 20 mg/dL   Creatinine, Ser 1.46 (H) 0.61 - 1.24 mg/dL   Calcium 8.8 (L) 8.9 - 10.3 mg/dL   Total Protein 7.3 6.5 - 8.1 g/dL   Albumin 3.9 3.5 - 5.0 g/dL   AST 28 15 - 41 U/L   ALT 38 0 - 44 U/L   Alkaline Phosphatase 78 38 - 126 U/L   Total Bilirubin 0.4 0.3 - 1.2 mg/dL   GFR, Estimated >60 >60 mL/min    Comment: (NOTE) Calculated using the CKD-EPI Creatinine Equation (2021)    Anion gap 12 5 - 15     Comment: Performed at [facility] [address], [place], [place] [address]  Magnesium     Status: None   Collection Time: 09/07/22  7:31 AM  Result Value Ref Range   Magnesium 1.8 1.7 - 2.4 mg/dL    Comment: Performed at [facility] [address], [place], [place] [address]  Lipase, blood  Status: None   Collection Time: 09/07/22  7:31 AM  Result Value Ref Range   Lipase 32 11 - 51 U/L    Comment: Performed at [facility], [place] [address], [place], [place] [address]  Lactic acid, plasma     Status: Abnormal   Collection Time: 09/07/22  9:10 AM  Result Value Ref Range   Lactic Acid, Venous 2.4 (HH) 0.5 - 1.9 mmol/L    Comment: CRITICAL RESULT CALLED TO, READ BACK BY AND VERIFIED WITH J.NEWTON RN @1028  01.19.2024 E.AHMED Performed at [facility], [place] [address] [place], [place] [address]   Lactic acid, plasma     Status: None   Collection Time: 09/07/22 12:00 PM  Result Value Ref Range   Lactic Acid, Venous 1.7 0.5 - 1.9 mmol/L    Comment: Performed at [facility] [address] [place], [place] [address]  Lipid panel     Status: Abnormal   Collection Time: 09/07/22 12:00 PM  Result Value Ref Range   Cholesterol 76 0 - 200 mg/dL   Triglycerides 151 (H) <150 mg/dL   HDL 27 (L) >40 mg/dL   Total CHOL/HDL Ratio 2.8 RATIO   VLDL 30 0 - 40 mg/dL   LDL Cholesterol 19 0 - 99 mg/dL    Comment:        Total Cholesterol/HDL:CHD Risk Coronary Heart Disease Risk Table                     Men   Women  1/2 Average Risk   3.4   3.3  Average Risk       5.0   4.4  2 X Average Risk   9.6   7.1  3 X Average Risk  23.4   11.0        Use the calculated Patient Ratio above and the CHD Risk Table to determine the patient's CHD Risk.        ATP III CLASSIFICATION (LDL):  <100     mg/dL   Optimal  100-129  mg/dL   Near or Above                    Optimal  130-159  mg/dL   Borderline  160-189  mg/dL   High  >190     mg/dL   Very High Performed at  [facility] [address] [place], [place] [address]    No results found.  Pending Labs Unresulted Labs (From admission, onward)     Start     Ordered   09/08/22 0500  CBC  Tomorrow morning,   R        09/07/22 1137   09/08/22 7253  Basic metabolic panel  Tomorrow morning,   R        09/07/22 1137            Vitals/Pain Today's Vitals   09/07/22 0945 09/07/22 1000 09/07/22 1015 09/07/22 1130  BP: (!) 137/92 (!) 144/89 (!) 127/92 (!) 141/92  Pulse: (!) 107 (!) 108 (!) 106 (!) 107  Resp: 16 18 (!) 25 18  Temp:    99.5 F (37.5 C)  TempSrc:    Oral  SpO2: 98% 99% 99% 99%  Weight:      Height:      PainSc:        Isolation Precautions No active isolations  Medications Medications  lactated ringers infusion ( Intravenous New Bag/Given  09/07/22 1201)  enoxaparin (LOVENOX) injection 40 mg (40 mg Subcutaneous Given 09/07/22 1219)  sodium chloride flush (NS) 0.9 % injection 3 mL (3 mLs Intravenous Not Given 09/07/22 1204)  acetaminophen (TYLENOL) tablet 650 mg (has no administration in time range)    Or  acetaminophen (TYLENOL) suppository 650 mg (has no administration in time range)  ondansetron (ZOFRAN) tablet 4 mg (has no administration in time range)    Or  ondansetron (ZOFRAN) injection 4 mg (has no administration in time range)  albuterol (PROVENTIL) (2.5 MG/3ML) 0.083% nebulizer solution 2.5 mg (has no administration in time range)  hydrALAZINE (APRESOLINE) injection 10 mg (has no administration in time range)  lactated ringers bolus 1,000 mL (0 mLs Intravenous Stopped 09/07/22 1219)  lactated ringers bolus 1,000 mL (0 mLs Intravenous Stopped 09/07/22 1219)  HYDROmorphone (DILAUDID) injection 0.5 mg (0.5 mg Intravenous Given 09/07/22 0908)  calcium gluconate 1 g/ 50 mL sodium chloride IVPB (1,000 mg Intravenous New Bag/Given 09/07/22 1210)    Mobility walks     Focused Assessments    R Recommendations: See Admitting Provider Note  Report given to:    Additional Notes: Patient present for diarrhea for past 24 hours, 1 episode in hospital, Aox4, walks with concern, getting fluids, no complaints of pain, VSS

## 2022-09-08 DIAGNOSIS — A084 Viral intestinal infection, unspecified: Secondary | ICD-10-CM | POA: Diagnosis not present

## 2022-09-08 LAB — GASTROINTESTINAL PANEL BY PCR, STOOL (REPLACES STOOL CULTURE)

## 2022-09-08 LAB — URINALYSIS, ROUTINE W REFLEX MICROSCOPIC
Bilirubin Urine: NEGATIVE
Glucose, UA: NEGATIVE mg/dL
Hgb urine dipstick: NEGATIVE
Ketones, ur: NEGATIVE mg/dL
Leukocytes,Ua: NEGATIVE
Nitrite: NEGATIVE
Protein, ur: NEGATIVE mg/dL
Specific Gravity, Urine: 1.01 (ref 1.005–1.030)
pH: 6 (ref 5.0–8.0)

## 2022-09-08 LAB — BASIC METABOLIC PANEL
Anion gap: 9 (ref 5–15)
BUN: 10 mg/dL (ref 6–20)
CO2: 18 mmol/L — ABNORMAL LOW (ref 22–32)
Calcium: 8.3 mg/dL — ABNORMAL LOW (ref 8.9–10.3)
Chloride: 108 mmol/L (ref 98–111)
Creatinine, Ser: 1.24 mg/dL (ref 0.61–1.24)
GFR, Estimated: >60 mL/min (ref >60)
Glucose, Bld: 114 mg/dL — ABNORMAL HIGH (ref 70–99)
Potassium: 3.4 mmol/L — ABNORMAL LOW (ref 3.5–5.1)
Sodium: 135 mmol/L (ref 135–145)

## 2022-09-08 LAB — CBC
HCT: 47.7 % (ref 39.0–52.0)
Hemoglobin: 15.9 g/dL (ref 13.0–17.0)
MCH: 27.3 pg (ref 26.0–34.0)
MCHC: 33.3 g/dL (ref 30.0–36.0)
MCV: 81.8 fL (ref 80.0–100.0)
Platelets: 233 10*3/uL (ref 150–400)
RBC: 5.83 MIL/uL — ABNORMAL HIGH (ref 4.22–5.81)
RDW: 13.7 % (ref 11.5–15.5)
WBC: 8.1 10*3/uL (ref 4.0–10.5)
nRBC: 0 % (ref 0.0–0.2)

## 2022-09-08 MED ORDER — POTASSIUM CHLORIDE CRYS ER 20 MEQ PO TBCR
30.0000 meq | EXTENDED_RELEASE_TABLET | ORAL | Status: AC
  Administered 2022-09-08 (×2): 30 meq via ORAL
  Filled 2022-09-08 (×2): qty 1

## 2022-09-08 MED ORDER — CHOLESTYRAMINE LIGHT 4 G PO PACK
4.0000 g | PACK | Freq: Two times a day (BID) | ORAL | Status: DC
  Administered 2022-09-08 – 2022-09-10 (×4): 4 g via ORAL
  Filled 2022-09-08 (×4): qty 1

## 2022-09-08 NOTE — Progress Notes (Addendum)
PROGRESS NOTE    Richard Hull  KDT:[id number] DOB: [birth year] DOA: 09/07/2022 PCP: Shon Baton, MD    Brief Narrative:   Richard Hull is a 47 y.o. male with past medical history significant for CAD, HTN, OSA on CPAP who presented to [facility] ED on 1/19 with complaints of continued diarrhea. Patient had just recently been hospitalized at [facility] from 1/15 -1/17 with acute renal failure secondary after initially presenting with complaints of nausea, vomiting, and diarrhea.  He had negative C. difficile and GI panel on 1/16.  CT scan abdomen pelvis noted diffuse colonic gas fluid levels consistent with diarrhea.  Symptoms were thought secondary to like a viral gastroenteritis.  Seem to be doing better initially after getting home but soon thereafter reported recurrence of symptoms.  He complains of having crampy abdominal pain epigastrically and around his waistline.  He reports having 20 stools on per day.  Denies having any blood present in stools.  At home he had tried Hardin Negus' colon health and Imodium without relief of symptoms.  He anytime he tries to eat it makes him nauseous, but he has not had any recurrence of vomiting symptoms since about a week ago.  Patient has noted some decreased urine output.  He had just recently had a routine screening called an colonoscopy about 3 weeks ago with [facility] for which he was told it showed no abnormality.  Denied any recent antibiotics or medication changes in the last 3 months.  He does report that his son has Crohn's disease.   In the emergency department patient was noted to be afebrile with heart rates elevated up to 133, respirations 15-25, blood pressures 127/92 to 156/102, and all other vital signs maintained.  Labs significant for hemoglobin 20.4, sodium 134, BUN 10, creatinine 1.46, and calcium 8.8.  Patient has been given 1 L lactated Ringer's, Dilaudid 0.1 mg IV x 1 dose.  TRH consulted for admission for further evaluation and  management.  Assessment & Plan:   Gastroenteritis, likely viral Patient presenting with recurrent/continued diarrhea.  Denies recent antibiotic use, no sick contacts or dietary changes.  Patient is afebrile without leukocytosis.  C. difficile and GI PCR from recent hospitalization negative; discharged from [facility] 1/17.  Recent colonoscopy outpatient unrevealing. -- [facility] following, appreciate assistance -- Continue enteric precautions; repeating GI PCR panel that is pending -- Start cholestyramine 4 g p.o. twice daily given persistence diarrhea -- Bentyl 20 mg p.o. every 8 hours -- Florastor -- IV fluid hydration, supportive care, antiemetics -- Strict I's and O's, monitor bowel movements  Acute renal failure secondary to dehydration: Creatinine 1.46 on admission, baseline 0.7.  Etiology likely prerenal in the setting of profuse diarrhea from underlying gastroenteritis as above. -- Cr 1.46>1.24 --Continue to hold home valsartan -- Continue IVF with LR at 125 mL/h -- BMP in am  Hypokalemia Potassium 3.4, etiology likely secondary to electrolyte loss from diarrhea.  Will replete. -- Repeat electrolytes in a.m. to include magnesium  SIRS Lactic acidosis Resolved.  Patient was noted to be tachycardic and tachypneic meeting SIRS criteria.  Initial lactic acid elevated 2.4.  Suspect secondary to dehydration to as repeat check after IV fluids within normal limits at 1.7, but question possibility of infectious cause of patient's symptoms. -- Continue treatment as above   Polycythemia Hemoglobin elevated at 20.4.  Suspect secondary to hemoconcentration in the setting of significant dehydration.  Repeat hemoglobin 15.9.  CAD Underwent PCI stent 2017. -- Continue  aspirin, Vascepa, Leqvio q6 months, Crestor  Essential hypertension -- Amlodipine 10 mg p.o. daily -- Metoprolol succinate 50 mg p.o. daily -- Continue to hold valsartan as above of -- Monitor BP  closely  Obesity Body mass index is 35.16 kg/m.  Discussed with patient needs for aggressive lifestyle changes/weight loss as this complicates all facets of care.  Outpatient follow-up with PCP.    DVT prophylaxis: enoxaparin (LOVENOX) injection 40 mg Start: 09/07/22 1145    Code Status: Full Code Family Communication: No family present at bedside this morning  Disposition Plan:  Level of care: Med-Surg Status is: Inpatient Remains inpatient appropriate because: IV fluids, awaiting further recommendations from GI, needs improvement of diarrhea before stable for discharge home    Consultants:  [facility], Dr. Levora Angel  Procedures:  None  Antimicrobials:  None   Subjective: Patient seen examined bedside, resting comfortably.  Lying in bed.  Continues to report excessive bowel movements and crampy abdominal pain.  Seen by GI yesterday.  Will start cholestyramine today to see if this assists in decreasing the amount of stool.  Continues on enteric precautions, repeating GI PCR panel that is pending.  Recently discharged from [facility] with unrevealing workup including negative C. difficile and GI PCR panel.  Denies headache, no dizziness, no chest pain, no palpitations, no shortness of breath, no fever/chills/night sweats, no nausea/vomiting, no focal weakness, no fatigue, no paresthesias.  No acute events overnight per nursing staff.  Objective: Vitals:   09/07/22 2334 09/08/22 0342 09/08/22 0526 09/08/22 0922  BP: 138/87 (!) 139/90 (!) 143/91 (!) 140/88  Pulse: (!) 103 96 94 94  Resp: 18 18 18 18   Temp: 97.8 F (36.6 C) 98.4 F (36.9 C) 98.2 F (36.8 C) 98.6 F (37 C)  TempSrc: Oral Oral Oral Oral  SpO2: 98% 99% 97% 99%  Weight:      Height:        Intake/Output Summary (Last 24 hours) at 09/08/2022 1100 Last data filed at 09/08/2022 09/10/2022 Gross per 24 hour  Intake 2800.28 ml  Output 0 ml  Net 2800.28 ml   Filed Weights   09/07/22 0704  Weight: 108 kg     Examination:  Physical Exam: GEN: NAD, alert and oriented x 3, wd/wn HEENT: NCAT, PERRL, EOMI, sclera clear, MMM PULM: CTAB w/o wheezes/crackles, normal respiratory effort, on room air CV: RRR w/o M/G/R GI: abd soft, NTND, NABS, no R/G/M MSK: no peripheral edema, muscle strength globally intact 5/5 bilateral upper/lower extremities NEURO: CN II-XII intact, no focal deficits, sensation to light touch intact PSYCH: normal mood/affect Integumentary: dry/intact, no rashes or wounds    Data Reviewed: I have personally reviewed following labs and imaging studies  CBC: Recent Labs  Lab 09/03/22 1737 09/04/22 0457 09/05/22 0313 09/07/22 0731 09/08/22 0510  WBC 9.5 7.0 5.6 10.5 8.1  NEUTROABS  --   --   --  6.6  --   HGB 19.9* 17.3* 15.9 20.4* 15.9  HCT 59.3* 51.8 48.7 59.9* 47.7  MCV 80.2 81.3 83.7 80.9 81.8  PLT 356 268 234 338 233   Basic Metabolic Panel: Recent Labs  Lab 09/03/22 1737 09/03/22 1747 09/04/22 0457 09/05/22 0313 09/07/22 0731 09/08/22 0510  NA 132*  --  134* 138 134* 135  K 3.8  --  3.6 3.8 3.9 3.4*  CL 101  --  106 107 108 108  CO2 16*  --  19* 24 14* 18*  GLUCOSE 160*  --  132* 114* 152*  114*  BUN 47*  --  33* 21* 10 10  CREATININE 1.60*  --  1.19 1.25* 1.46* 1.24  CALCIUM 9.6  --  8.5* 8.4* 8.8* 8.3*  MG  --  2.7*  --   --  1.8  --    GFR: Estimated Creatinine Clearance: 91.1 mL/min (by C-G formula based on SCr of 1.24 mg/dL). Liver Function Tests: Recent Labs  Lab 09/03/22 1737 09/05/22 0313 09/07/22 0731  AST 68* 33 28  ALT 87* 50* 38  ALKPHOS 96 72 78  BILITOT 0.9 0.6 0.4  PROT 8.5* 6.3* 7.3  ALBUMIN 5.0 3.6 3.9   Recent Labs  Lab 09/03/22 1737 09/07/22 0731  LIPASE 45 32   No results for input(s): "AMMONIA" in the last 168 hours. Coagulation Profile: No results for input(s): "INR", "PROTIME" in the last 168 hours. Cardiac Enzymes: No results for input(s): "CKTOTAL", "CKMB", "CKMBINDEX", "TROPONINI" in the last 168  hours. BNP (last 3 results) No results for input(s): "PROBNP" in the last 8760 hours. HbA1C: No results for input(s): "HGBA1C" in the last 72 hours. CBG: No results for input(s): "GLUCAP" in the last 168 hours. Lipid Profile: Recent Labs    09/07/22 1200  CHOL 76  HDL 27*  LDLCALC 19  TRIG 962*  CHOLHDL 2.8   Thyroid Function Tests: No results for input(s): "TSH", "T4TOTAL", "FREET4", "T3FREE", "THYROIDAB" in the last 72 hours. Anemia Panel: No results for input(s): "VITAMINB12", "FOLATE", "FERRITIN", "TIBC", "IRON", "RETICCTPCT" in the last 72 hours. Sepsis Labs: Recent Labs  Lab 09/03/22 1929 09/05/22 0313 09/07/22 0910 09/07/22 1200  LATICACIDVEN 2.6* 1.4 2.4* 1.7    Recent Results (from the past 240 hour(s))  C Difficile Quick Screen w PCR reflex     Status: None   Collection Time: 09/04/22 11:30 AM   Specimen: STOOL  Result Value Ref Range Status   C Diff antigen NEGATIVE NEGATIVE Final   C Diff toxin NEGATIVE NEGATIVE Final   C Diff interpretation No C. difficile detected.  Final    Comment: Performed at [facility], [address], [place], [place] [address]  Gastrointestinal Panel by PCR , Stool     Status: None   Collection Time: 09/04/22 11:30 AM   Specimen: STOOL  Result Value Ref Range Status   Campylobacter species NOT DETECTED NOT DETECTED Final   Plesimonas shigelloides NOT DETECTED NOT DETECTED Final   Salmonella species NOT DETECTED NOT DETECTED Final   Yersinia enterocolitica NOT DETECTED NOT DETECTED Final   Vibrio species NOT DETECTED NOT DETECTED Final   Vibrio cholerae NOT DETECTED NOT DETECTED Final   Enteroaggregative E coli (EAEC) NOT DETECTED NOT DETECTED Final   Enteropathogenic E coli (EPEC) NOT DETECTED NOT DETECTED Final   Enterotoxigenic E coli (ETEC) NOT DETECTED NOT DETECTED Final   Shiga like toxin producing E coli (STEC) NOT DETECTED NOT DETECTED Final   Shigella/Enteroinvasive E coli (EIEC) NOT DETECTED NOT DETECTED Final    Cryptosporidium NOT DETECTED NOT DETECTED Final   Cyclospora cayetanensis NOT DETECTED NOT DETECTED Final   Entamoeba histolytica NOT DETECTED NOT DETECTED Final   Giardia lamblia NOT DETECTED NOT DETECTED Final   Adenovirus F40/41 NOT DETECTED NOT DETECTED Final   Astrovirus NOT DETECTED NOT DETECTED Final   Norovirus GI/GII NOT DETECTED NOT DETECTED Final   Rotavirus A NOT DETECTED NOT DETECTED Final   Sapovirus (I, II, IV, and V) NOT DETECTED NOT DETECTED Final    Comment: Performed at [facility], [address]  [place], [place] [address]         Radiology Studies: No results found.      Scheduled Meds:  amLODipine  10 mg Oral q AM   aspirin EC  81 mg Oral QPM   cholestyramine light  4 g Oral BID   dicyclomine  20 mg Oral Q8H   enoxaparin (LOVENOX) injection  40 mg Subcutaneous Daily   icosapent Ethyl  2 g Oral BID   metoprolol succinate  50 mg Oral q AM   potassium chloride  30 mEq Oral Q3H   rosuvastatin  20 mg Oral QPM   saccharomyces boulardii  250 mg Oral BID   sodium chloride flush  3 mL Intravenous Q12H   Continuous Infusions:  lactated ringers 125 mL/hr at 09/08/22 0532     LOS: 1 day    Time spent: 50 minutes spent on chart review, discussion with nursing staff, consultants, updating family and interview/physical exam; more than 50% of that time was spent in counseling and/or coordination of care.    Debbie Yearick J [place] DO [facility] Available via Epic secure chat 7am-7pm After these hours, please refer to coverage provider listed on [email or web address] 09/08/2022, 11:00 AM

## 2022-09-08 NOTE — Progress Notes (Signed)
[name] Progress Note  Richard Hull 47 y.o. 1976-08-13  CC: Diarrhea, abdominal cramps   Subjective: Seen and examined at bedside.  Continues to have multiple bowel movements with watery diarrhea but started noticing some substance to it this morning.  Abdominal cramping improved.  ROS : Negative for nausea and vomiting.  Afebrile   Objective: Vital signs in last 24 hours: Vitals:   09/08/22 0526 09/08/22 0922  BP: (!) 143/91 (!) 140/88  Pulse: 94 94  Resp: 18 18  Temp: 98.2 F (36.8 C) 98.6 F (37 C)  SpO2: 97% 99%    Physical Exam:  General:  Alert, cooperative, no distress, appears stated age  Head:  Normocephalic, without obvious abnormality, atraumatic  Eyes:  , EOM's intact,   Lungs:   Clear to auscultation bilaterally, respirations unlabored  Heart:  Regular rate and rhythm, S1, S2 normal  Abdomen:   Soft, non-tender, bowel sounds active all four quadrants,  no masses,   Extremities: Extremities normal, atraumatic, no  edema  Pulses: 2+ and symmetric    Lab Results: Recent Labs    09/07/22 0731 09/08/22 0510  NA 134* 135  K 3.9 3.4*  CL 108 108  CO2 14* 18*  GLUCOSE 152* 114*  BUN 10 10  CREATININE 1.46* 1.24  CALCIUM 8.8* 8.3*  MG 1.8  --    Recent Labs    09/07/22 0731  AST 28  ALT 38  ALKPHOS 78  BILITOT 0.4  PROT 7.3  ALBUMIN 3.9   Recent Labs    09/07/22 0731 09/08/22 0510  WBC 10.5 8.1  NEUTROABS 6.6  --   HGB 20.4* 15.9  HCT 59.9* 47.7  MCV 80.9 81.8  PLT 338 233   No results for input(s): "LABPROT", "INR" in the last 72 hours.    Assessment/Plan: -Persistent diarrhea for last 1 week.  GI pathogen panel is negative few days ago.     Recommendations -------------------------- -Most likely viral gastroenteritis given negative GI pathogen panel and C. difficile.  Possible postinfectious irritable bowel syndrome as well because of abdominal cramps. -Continue dicyclomine and Florastor -Started on  cholestyramine -Hopefully discharge tomorrow.  Repeat GI pathogen panel pending. -GI will sign off.  Call us back if needed.  Discussed with hospitalist.   Otis Brace MD, [place] 09/08/2022, 2:06 PM  Contact #  [phone]

## 2022-09-09 DIAGNOSIS — R197 Diarrhea, unspecified: Secondary | ICD-10-CM | POA: Diagnosis not present

## 2022-09-09 LAB — MAGNESIUM: Magnesium: 1.7 mg/dL (ref 1.7–2.4)

## 2022-09-09 LAB — BASIC METABOLIC PANEL
Anion gap: 8 (ref 5–15)
BUN: 7 mg/dL (ref 6–20)
CO2: 20 mmol/L — ABNORMAL LOW (ref 22–32)
Calcium: 8.3 mg/dL — ABNORMAL LOW (ref 8.9–10.3)
Chloride: 109 mmol/L (ref 98–111)
Creatinine, Ser: 1.08 mg/dL (ref 0.61–1.24)
GFR, Estimated: >60 mL/min (ref >60)
Glucose, Bld: 99 mg/dL (ref 70–99)
Potassium: 3.1 mmol/L — ABNORMAL LOW (ref 3.5–5.1)
Sodium: 137 mmol/L (ref 135–145)

## 2022-09-09 MED ORDER — POTASSIUM CHLORIDE CRYS ER 20 MEQ PO TBCR
30.0000 meq | EXTENDED_RELEASE_TABLET | ORAL | Status: AC
  Administered 2022-09-09 (×3): 30 meq via ORAL
  Filled 2022-09-09 (×3): qty 1

## 2022-09-09 MED ORDER — TRAMADOL HCL 50 MG PO TABS
50.0000 mg | ORAL_TABLET | Freq: Four times a day (QID) | ORAL | Status: DC | PRN
  Administered 2022-09-09: 50 mg via ORAL
  Filled 2022-09-09: qty 1

## 2022-09-09 MED ORDER — MAGNESIUM SULFATE 2 GM/50ML IV SOLN
2.0000 g | Freq: Once | INTRAVENOUS | Status: AC
  Administered 2022-09-09: 2 g via INTRAVENOUS
  Filled 2022-09-09: qty 50

## 2022-09-09 MED ORDER — ORAL CARE MOUTH RINSE: 15.0000 mL | OROMUCOSAL | Status: DC | PRN

## 2022-09-09 MED ORDER — LOPERAMIDE HCL 2 MG PO CAPS: 2.0000 mg | ORAL_CAPSULE | ORAL | Status: DC | PRN

## 2022-09-09 NOTE — Progress Notes (Signed)
PROGRESS NOTE    Richard Hull  XQJ:[id number] DOB: April 29, 1976 DOA: 09/07/2022 PCP: Shon Baton, MD    Brief Narrative:   Richard Hull is a 47 y.o. male with past medical history significant for CAD, HTN, OSA on CPAP who presented to [facility] ED on 1/19 with complaints of continued diarrhea. Patient had just recently been hospitalized at [facility] from 1/15 -1/17 with acute renal failure secondary after initially presenting with complaints of nausea, vomiting, and diarrhea.  He had negative C. difficile and GI panel on 1/16.  CT scan abdomen pelvis noted diffuse colonic gas fluid levels consistent with diarrhea.  Symptoms were thought secondary to like a viral gastroenteritis.  Seem to be doing better initially after getting home but soon thereafter reported recurrence of symptoms.  He complains of having crampy abdominal pain epigastrically and around his waistline.  He reports having 20 stools on per day.  Denies having any blood present in stools.  At home he had tried Hardin Negus' colon health and Imodium without relief of symptoms.  He anytime he tries to eat it makes him nauseous, but he has not had any recurrence of vomiting symptoms since about a week ago.  Patient has noted some decreased urine output.  He had just recently had a routine screening called an colonoscopy about 3 weeks ago with [facility] for which he was told it showed no abnormality.  Denied any recent antibiotics or medication changes in the last 3 months.  He does report that his son has Crohn's disease.   In the emergency department patient was noted to be afebrile with heart rates elevated up to 133, respirations 15-25, blood pressures 127/92 to 156/102, and all other vital signs maintained.  Labs significant for hemoglobin 20.4, sodium 134, BUN 10, creatinine 1.46, and calcium 8.8.  Patient has been given 1 L lactated Ringer's, Dilaudid 0.1 mg IV x 1 dose.  TRH consulted for admission for further evaluation and  management.  Assessment & Plan:   Gastroenteritis, likely viral Patient presenting with recurrent/continued diarrhea.  Denies recent antibiotic use, no sick contacts or dietary changes.  Patient is afebrile without leukocytosis.  C. difficile and GI PCR from recent hospitalization negative; discharged from [facility] 1/17.  Recent colonoscopy outpatient unrevealing.  [facility] was consulted and now signed off.  Repeat GI PCR panel negative, enteric precautions discontinued. -- Started cholestyramine 4 g p.o. twice daily given persistence diarrhea -- Bentyl 20 mg p.o. every 8 hours -- Florastor -- immodium PRN -- IV fluid hydration, supportive care, antiemetics -- Strict I's and O's, monitor bowel movements  Acute renal failure secondary to dehydration: Creatinine 1.46 on admission, baseline 0.7.  Etiology likely prerenal in the setting of profuse diarrhea from underlying gastroenteritis as above. -- Cr 1.46>1.24>1.08 --Continue to hold home valsartan -- Continue IVF with LR at 75 mL/h -- BMP in am  Hypokalemia Hypomagnesemia Potassium 3.1 and magnesium 1.7, etiology likely secondary to electrolyte loss from diarrhea.  Will replete. -- Repeat electrolytes in a.m. to include magnesium  SIRS Lactic acidosis Resolved.  Patient was noted to be tachycardic and tachypneic meeting SIRS criteria.  Initial lactic acid elevated 2.4.  Suspect secondary to dehydration to as repeat check after IV fluids within normal limits at 1.7, but question possibility of infectious cause of patient's symptoms. -- Continue treatment as above   Polycythemia Hemoglobin elevated at 20.4.  Suspect secondary to hemoconcentration in the setting of significant dehydration.  Repeat hemoglobin 15.9.  CAD Underwent PCI stent 2017. -- Continue aspirin, Vascepa, Leqvio q6 months, Crestor  Essential hypertension -- Amlodipine 10 mg p.o. daily -- Metoprolol succinate 50 mg p.o. daily -- Continue to hold  valsartan as above of -- Monitor BP closely  Obesity Body mass index is 35.42 kg/m.  Discussed with patient needs for aggressive lifestyle changes/weight loss as this complicates all facets of care.  Outpatient follow-up with PCP.    DVT prophylaxis: enoxaparin (LOVENOX) injection 40 mg Start: 09/07/22 1145    Code Status: Full Code Family Communication: No family present at bedside this morning  Disposition Plan:  Level of care: Med-Surg Status is: Inpatient Remains inpatient appropriate because: IV fluids, pleating electrolytes, started on cholestyramine and Imodium.  Anticipate likely discharge home tomorrow.    Consultants:  [facility], Dr. Levora Angel  Procedures:  None  Antimicrobials:  None   Subjective: Patient seen examined bedside, resting comfortably.  Sitting in bedside chair working on computer.  Seen by GI yesterday and now signed off.  Repeat GI PCR panel negative.  Enteric precautions now removed.  Started on cholestyramine yesterday and now Imodium.  Overall feels much better, the best he has felt in over a week.  Discussed with patient regarding need for electrolyte replacement, continue IV fluids and will start Imodium.  Anticipate likely discharge home tomorrow.  No other questions, concerns or complaints at this time.  Denies headache, no dizziness, no chest pain, no palpitations, no shortness of breath, no fever/chills/night sweats, no nausea/vomiting, no focal weakness, no fatigue, no paresthesias.  No acute events overnight per nursing staff.  Objective: Vitals:   09/08/22 1727 09/08/22 2020 09/09/22 0542 09/09/22 0941  BP: 137/88 125/69 129/77 130/83  Pulse: (!) 105 98 93 90  Resp: 18 18 16 18   Temp: 99.5 F (37.5 C) 99.2 F (37.3 C) 98 F (36.7 C) (!) 97.4 F (36.3 C)  TempSrc: Oral Oral  Oral  SpO2: 97% 99% 99% 99%  Weight:  108.8 kg    Height:  5\' 9"  (1.753 m)      Intake/Output Summary (Last 24 hours) at 09/09/2022 0954 Last data filed at  09/09/2022 0600 Gross per 24 hour  Intake 5955.55 ml  Output 450 ml  Net 5505.55 ml   Filed Weights   09/07/22 0704 09/08/22 2020  Weight: 108 kg 108.8 kg    Examination:  Physical Exam: GEN: NAD, alert and oriented x 3, wd/wn HEENT: NCAT, PERRL, EOMI, sclera clear, MMM PULM: CTAB w/o wheezes/crackles, normal respiratory effort, on room air CV: RRR w/o M/G/R GI: abd soft, NTND, NABS, no R/G/M MSK: no peripheral edema, muscle strength globally intact 5/5 bilateral upper/lower extremities NEURO: CN II-XII intact, no focal deficits, sensation to light touch intact PSYCH: normal mood/affect Integumentary: dry/intact, no rashes or wounds    Data Reviewed: I have personally reviewed following labs and imaging studies  CBC: Recent Labs  Lab 09/03/22 1737 09/04/22 0457 09/05/22 0313 09/07/22 0731 09/08/22 0510  WBC 9.5 7.0 5.6 10.5 8.1  NEUTROABS  --   --   --  6.6  --   HGB 19.9* 17.3* 15.9 20.4* 15.9  HCT 59.3* 51.8 48.7 59.9* 47.7  MCV 80.2 81.3 83.7 80.9 81.8  PLT 356 268 234 338 233   Basic Metabolic Panel: Recent Labs  Lab 09/03/22 1747 09/04/22 0457 09/05/22 0313 09/07/22 0731 09/08/22 0510 09/09/22 0638  NA  --  134* 138 134* 135 137  K  --  3.6 3.8 3.9 3.4* 3.1*  CL  --  106 107 108 108 109  CO2  --  19* 24 14* 18* 20*  GLUCOSE  --  132* 114* 152* 114* 99  BUN  --  33* 21* 10 10 7   CREATININE  --  1.19 1.25* 1.46* 1.24 1.08  CALCIUM  --  8.5* 8.4* 8.8* 8.3* 8.3*  MG 2.7*  --   --  1.8  --  1.7   GFR: Estimated Creatinine Clearance: 104.9 mL/min (by C-G formula based on SCr of 1.08 mg/dL). Liver Function Tests: Recent Labs  Lab 09/03/22 1737 09/05/22 0313 09/07/22 0731  AST 68* 33 28  ALT 87* 50* 38  ALKPHOS 96 72 78  BILITOT 0.9 0.6 0.4  PROT 8.5* 6.3* 7.3  ALBUMIN 5.0 3.6 3.9   Recent Labs  Lab 09/03/22 1737 09/07/22 0731  LIPASE 45 32   No results for input(s): "AMMONIA" in the last 168 hours. Coagulation Profile: No results for  input(s): "INR", "PROTIME" in the last 168 hours. Cardiac Enzymes: No results for input(s): "CKTOTAL", "CKMB", "CKMBINDEX", "TROPONINI" in the last 168 hours. BNP (last 3 results) No results for input(s): "PROBNP" in the last 8760 hours. HbA1C: No results for input(s): "HGBA1C" in the last 72 hours. CBG: No results for input(s): "GLUCAP" in the last 168 hours. Lipid Profile: Recent Labs    09/07/22 1200  CHOL 76  HDL 27*  LDLCALC 19  TRIG 09/09/22*  CHOLHDL 2.8   Thyroid Function Tests: No results for input(s): "TSH", "T4TOTAL", "FREET4", "T3FREE", "THYROIDAB" in the last 72 hours. Anemia Panel: No results for input(s): "VITAMINB12", "FOLATE", "FERRITIN", "TIBC", "IRON", "RETICCTPCT" in the last 72 hours. Sepsis Labs: Recent Labs  Lab 09/03/22 1929 09/05/22 0313 09/07/22 0910 09/07/22 1200  LATICACIDVEN 2.6* 1.4 2.4* 1.7    Recent Results (from the past 240 hour(s))  C Difficile Quick Screen w PCR reflex     Status: None   Collection Time: 09/04/22 11:30 AM   Specimen: STOOL  Result Value Ref Range Status   C Diff antigen NEGATIVE NEGATIVE Final   C Diff toxin NEGATIVE NEGATIVE Final   C Diff interpretation No C. difficile detected.  Final    Comment: Performed at [facility], [address], [place], [place]  Gastrointestinal Panel by PCR , Stool     Status: None   Collection Time: 09/04/22 11:30 AM   Specimen: STOOL  Result Value Ref Range Status   Campylobacter species NOT DETECTED NOT DETECTED Final   Plesimonas shigelloides NOT DETECTED NOT DETECTED Final   Salmonella species NOT DETECTED NOT DETECTED Final   Yersinia enterocolitica NOT DETECTED NOT DETECTED Final   Vibrio species NOT DETECTED NOT DETECTED Final   Vibrio cholerae NOT DETECTED NOT DETECTED Final   Enteroaggregative E coli (EAEC) NOT DETECTED NOT DETECTED Final   Enteropathogenic E coli (EPEC) NOT DETECTED NOT DETECTED Final   Enterotoxigenic E coli (ETEC) NOT DETECTED NOT DETECTED Final    Shiga like toxin producing E coli (STEC) NOT DETECTED NOT DETECTED Final   Shigella/Enteroinvasive E coli (EIEC) NOT DETECTED NOT DETECTED Final   Cryptosporidium NOT DETECTED NOT DETECTED Final   Cyclospora cayetanensis NOT DETECTED NOT DETECTED Final   Entamoeba histolytica NOT DETECTED NOT DETECTED Final   Giardia lamblia NOT DETECTED NOT DETECTED Final   Adenovirus F40/41 NOT DETECTED NOT DETECTED Final   Astrovirus NOT DETECTED NOT DETECTED Final   Norovirus GI/GII NOT DETECTED NOT DETECTED Final   Rotavirus A NOT DETECTED NOT DETECTED Final  Sapovirus (I, II, IV, and V) NOT DETECTED NOT DETECTED Final    Comment: Performed at [facility], [address] [place], [place] [address]  Gastrointestinal Panel by PCR , Stool     Status: None   Collection Time: 09/07/22  1:35 PM   Specimen: Stool  Result Value Ref Range Status   Campylobacter species NOT DETECTED NOT DETECTED Final   Plesimonas shigelloides NOT DETECTED NOT DETECTED Final   Salmonella species NOT DETECTED NOT DETECTED Final   Yersinia enterocolitica NOT DETECTED NOT DETECTED Final   Vibrio species NOT DETECTED NOT DETECTED Final   Vibrio cholerae NOT DETECTED NOT DETECTED Final   Enteroaggregative E coli (EAEC) NOT DETECTED NOT DETECTED Final   Enteropathogenic E coli (EPEC) NOT DETECTED NOT DETECTED Final   Enterotoxigenic E coli (ETEC) NOT DETECTED NOT DETECTED Final   Shiga like toxin producing E coli (STEC) NOT DETECTED NOT DETECTED Final   Shigella/Enteroinvasive E coli (EIEC) NOT DETECTED NOT DETECTED Final   Cryptosporidium NOT DETECTED NOT DETECTED Final   Cyclospora cayetanensis NOT DETECTED NOT DETECTED Final   Entamoeba histolytica NOT DETECTED NOT DETECTED Final   Giardia lamblia NOT DETECTED NOT DETECTED Final   Adenovirus F40/41 NOT DETECTED NOT DETECTED Final   Astrovirus NOT DETECTED NOT DETECTED Final   Norovirus GI/GII NOT DETECTED NOT DETECTED Final   Rotavirus A NOT DETECTED NOT  DETECTED Final   Sapovirus (I, II, IV, and V) NOT DETECTED NOT DETECTED Final    Comment: Performed at [facility], [address], [place], [place] [address]         Radiology Studies: No results found.      Scheduled Meds:  amLODipine  10 mg Oral q AM   aspirin EC  81 mg Oral QPM   cholestyramine light  4 g Oral BID   dicyclomine  20 mg Oral Q8H   enoxaparin (LOVENOX) injection  40 mg Subcutaneous Daily   icosapent Ethyl  2 g Oral BID   metoprolol succinate  50 mg Oral q AM   potassium chloride  30 mEq Oral Q3H   rosuvastatin  20 mg Oral QPM   saccharomyces boulardii  250 mg Oral BID   sodium chloride flush  3 mL Intravenous Q12H   Continuous Infusions:  lactated ringers 125 mL/hr at 09/09/22 0544   magnesium sulfate bolus IVPB 2 g (09/09/22 0923)     LOS: 2 days    Time spent: 50 minutes spent on chart review, discussion with nursing staff, consultants, updating family and interview/physical exam; more than 50% of that time was spent in counseling and/or coordination of care.    Ellarie Picking J [place] DO [facility] Available via Epic secure chat 7am-7pm After these hours, please refer to coverage provider listed on [email or web address] 09/09/2022, 9:54 AM

## 2022-09-09 NOTE — Plan of Care (Signed)
  Problem: Health Behavior/Discharge Planning: Goal: Ability to manage health-related needs will improve Outcome: Progressing   Problem: Activity: Goal: Risk for activity intolerance will decrease Outcome: Progressing   Problem: Nutrition: Goal: Adequate nutrition will be maintained Outcome: Progressing   Problem: Safety: Goal: Ability to remain free from injury will improve Outcome: Progressing   

## 2022-09-10 DIAGNOSIS — A084 Viral intestinal infection, unspecified: Secondary | ICD-10-CM | POA: Diagnosis not present

## 2022-09-10 LAB — BASIC METABOLIC PANEL
Anion gap: 4 — ABNORMAL LOW (ref 5–15)
BUN: 6 mg/dL (ref 6–20)
CO2: 23 mmol/L (ref 22–32)
Calcium: 7.9 mg/dL — ABNORMAL LOW (ref 8.9–10.3)
Chloride: 112 mmol/L — ABNORMAL HIGH (ref 98–111)
Creatinine, Ser: 1.07 mg/dL (ref 0.61–1.24)
GFR, Estimated: >60 mL/min (ref >60)
Glucose, Bld: 95 mg/dL (ref 70–99)
Potassium: 3.9 mmol/L (ref 3.5–5.1)
Sodium: 139 mmol/L (ref 135–145)

## 2022-09-10 LAB — MAGNESIUM: Magnesium: 2 mg/dL (ref 1.7–2.4)

## 2022-09-10 MED ORDER — DICYCLOMINE HCL 20 MG PO TABS: 20.0000 mg | ORAL_TABLET | Freq: Three times a day (TID) | ORAL | 0 refills | Status: DC | PRN

## 2022-09-10 MED ORDER — CHOLESTYRAMINE LIGHT 4 G PO PACK: 4.0000 g | PACK | Freq: Two times a day (BID) | ORAL | 0 refills | Status: DC

## 2022-09-10 MED ORDER — LOPERAMIDE HCL 2 MG PO CAPS: 2.0000 mg | ORAL_CAPSULE | ORAL | 0 refills | Status: DC | PRN

## 2022-09-10 NOTE — Discharge Summary (Signed)
Physician Discharge Summary  Richard Hull:[id number] DOB: [birth year] DOA: 09/07/2022  PCP: [name], MD  Admit date: 09/07/2022 Discharge date: 09/10/2022  Admitted From: Home Disposition: Home  Recommendations for Outpatient Follow-up:  Follow up with PCP in 1-2 weeks Follow-up gastroenterology, Dr. Pati Gallo as needed Continue Cholestyramine twice daily on discharge and slowly wean off as bowel movements improved. Please obtain BMP in one week to ensure renal function remained stable  Home Health: No Equipment/Devices: None  Discharge Condition: Stable CODE STATUS: Full code Diet recommendation: Heart healthy diet  History of present illness:  Richard Hull is a 47 y.o. male with past medical history significant for CAD, HTN, OSA on CPAP who presented to [facility] ED on 1/19 with complaints of continued diarrhea. Patient had just recently been hospitalized at [facility] from 1/15 -1/17 with acute renal failure secondary after initially presenting with complaints of nausea, vomiting, and diarrhea.  He had negative C. difficile and GI panel on 1/16.  CT scan abdomen pelvis noted diffuse colonic gas fluid levels consistent with diarrhea.  Symptoms were thought secondary to like a viral gastroenteritis.  Seem to be doing better initially after getting home but soon thereafter reported recurrence of symptoms.  He complains of having crampy abdominal pain epigastrically and around his waistline.  He reports having 20 stools on per day.  Denies having any blood present in stools.  At home he had tried [employer] colon health and Imodium without relief of symptoms.  He anytime he tries to eat it makes him nauseous, but he has not had any recurrence of vomiting symptoms since about a week ago.  Patient has noted some decreased urine output.  He had just recently had a routine screening called an colonoscopy about 3 weeks ago with [facility] for which he was told it showed no abnormality.   Denied any recent antibiotics or medication changes in the last 3 months.  He does report that his son has Crohn's disease.   In the emergency department patient was noted to be afebrile with heart rates elevated up to 133, respirations 15-25, blood pressures 127/92 to 156/102, and all other vital signs maintained.  Labs significant for hemoglobin 20.4, sodium 134, BUN 10, creatinine 1.46, and calcium 8.8.  Patient has been given 1 L lactated Ringer's, Dilaudid 0.1 mg IV x 1 dose.  TRH consulted for admission for further evaluation and management.  Hospital course:  Gastroenteritis, likely viral Patient presenting with recurrent/continued diarrhea.  Denies recent antibiotic use, no sick contacts or dietary changes.  Patient is afebrile without leukocytosis.  C. difficile and GI PCR from recent hospitalization negative; discharged from [facility] 1/17.  Recent colonoscopy outpatient unrevealing.  [facility] was consulted and now signed off.  Repeat GI PCR panel negative, enteric precautions discontinued.  Patient was started on cholestyramine 4 g p.o. twice daily with improvement of diarrhea.  Also on Bentyl 20 mg p.o. 8 hours PRN for abdominal cramping.  Continue Florastor.  Discussed with patient to slowly wean off of cholestyramine as bowel movements improved.  Outpatient follow-up PCP/GI.   Acute renal failure secondary to dehydration: Creatinine 1.46 on admission, baseline 0.7.  Etiology likely prerenal in the setting of profuse diarrhea from underlying gastroenteritis as above.  Creatinine improved to 1.06 at time of discharge.  Continue encourage increased oral intake.  Recommend repeat BMP 1 week to ensure renal function remained stable.   Hypokalemia Hypomagnesemia Pleated during hospitalization.  Potassium 3.9 and magnesium  2.0 at time of discharge.   SIRS Lactic acidosis Resolved.  Patient was noted to be tachycardic and tachypneic meeting SIRS criteria.  Initial lactic acid  elevated 2.4.  Suspect secondary to dehydration to as repeat check after IV fluids within normal limits at 1.7.   Polycythemia Hemoglobin elevated at 20.4.  Suspect secondary to hemoconcentration in the setting of significant dehydration.  Repeat hemoglobin 15.9 following IV fluid hydration.   CAD Underwent PCI stent 2017. Continue aspirin, Vascepa, Leqvio q6 months, Crestor   Essential hypertension Continue amlodipine 10 mg p.o. daily, Metoprolol succinate 50 mg p.o. daily, and valsartan.   Obesity Body mass index is 35.42 kg/m.  Discussed with patient needs for aggressive lifestyle changes/weight loss as this complicates all facets of care.  On Ozempic outpatient.  Outpatient follow-up with PCP.    Discharge Diagnoses:  Principal Problem:   Diarrhea Active Problems:   Abdominal pain   Renal insufficiency   Lactic acidosis   Dehydration   SIRS (systemic inflammatory response syndrome) (HCC)   HTN (hypertension)   Hyperlipidemia LDL goal <70   Mixed hyperlipidemia   Polycythemia    Discharge Instructions  Discharge Instructions     Call MD for:  difficulty breathing, headache or visual disturbances   Complete by: As directed    Call MD for:  extreme fatigue   Complete by: As directed    Call MD for:  persistant dizziness or light-headedness   Complete by: As directed    Call MD for:  persistant nausea and vomiting   Complete by: As directed    Call MD for:  severe uncontrolled pain   Complete by: As directed    Call MD for:  temperature >100.4   Complete by: As directed    Diet - low sodium heart healthy   Complete by: As directed    Increase activity slowly   Complete by: As directed       Allergies as of 09/10/2022       Reactions   Benicar [olmesartan] Other (See Comments)   Metallic taste   Amoxil [amoxicillin] Rash   Penicillins Rash, Other (See Comments)   Has patient had a PCN reaction causing immediate rash, facial/tongue/throat swelling, SOB or  lightheadedness with hypotension: Yes Has patient had a PCN reaction causing severe rash involving mucus membranes or skin necrosis: No Has patient had a PCN reaction that required hospitalization No Has patient had a PCN reaction occurring within the last 10 years: No If all of the above answers are "NO", then may proceed with Cephalosporin use.        Medication List     TAKE these medications    amLODipine 10 MG tablet Commonly known as: NORVASC Take 1 tablet by mouth in the morning.   aspirin EC 81 MG tablet Take 1 tablet (81 mg total) by mouth daily. What changed: when to take this   cholestyramine light 4 g packet Commonly known as: PREVALITE Take 1 packet (4 g total) by mouth 2 (two) times daily. Slowly wean off once diarrhea improves   icosapent Ethyl 1 g capsule Commonly known as: VASCEPA Take 2 capsules (2 g total) by mouth 2 (two) times daily.   Leqvio 284 MG/1.5ML Sosy injection Generic drug: inclisiran as directed Subcutaneous every 6 months   loperamide 2 MG capsule Commonly known as: IMODIUM Take 1 capsule (2 mg total) by mouth as needed for diarrhea or loose stools.   metoprolol succinate 50 MG 24 hr tablet  Commonly known as: TOPROL-XL Take 1 tablet (50 mg total) by mouth daily. Take with or immediately following a meal. What changed:  when to take this additional instructions   nitroGLYCERIN 0.4 MG SL tablet Commonly known as: NITROSTAT Place 1 tablet (0.4 mg total) under the tongue every 5 (five) minutes as needed for chest pain (CP or SOB).   Ozempic (0.25 or 0.5 MG/DOSE) 2 MG/3ML Sopn Generic drug: Semaglutide(0.25 or 0.5MG /DOS) Inject 0.5 mg into the skin every Friday.   PROBIOTIC PO Take 1 capsule by mouth daily as needed (stomach discomfort).   rosuvastatin 20 MG tablet Commonly known as: CRESTOR Take 1 tablet (20 mg total) by mouth daily. What changed: when to take this   valsartan 160 MG tablet Commonly known as: Diovan Take 1  tablet (160 mg total) by mouth daily. What changed: when to take this        Follow-up Information     Shon Baton, MD. Schedule an appointment as soon as possible for a visit in 1 week(s).   Specialty: Internal Medicine Contact information: [place] [address] [phone]         Ronnette Juniper, MD Follow up.   Specialty: Gastroenterology Why: As needed Contact information: [address] [place] [address] [phone]                Allergies  Allergen Reactions   Benicar [Olmesartan] Other (See Comments)    Metallic taste   Amoxil [Amoxicillin] Rash   Penicillins Rash and Other (See Comments)    Has patient had a PCN reaction causing immediate rash, facial/tongue/throat swelling, SOB or lightheadedness with hypotension: Yes Has patient had a PCN reaction causing severe rash involving mucus membranes or skin necrosis: No Has patient had a PCN reaction that required hospitalization No Has patient had a PCN reaction occurring within the last 10 years: No If all of the above answers are "NO", then may proceed with Cephalosporin use.     Consultations: [facility] gastroenterology, Dr. Alessandra Bevels   Procedures/Studies: CT ABDOMEN PELVIS W CONTRAST  Result Date: 09/03/2022 CLINICAL DATA:  Nausea/vomiting/diarrhea for 4 days, abdominal pain EXAM: CT ABDOMEN AND PELVIS WITH CONTRAST TECHNIQUE: Multidetector CT imaging of the abdomen and pelvis was performed using the standard protocol following bolus administration of intravenous contrast. RADIATION DOSE REDUCTION: This exam was performed according to the departmental dose-optimization program which includes automated exposure control, adjustment of the mA and/or kV according to patient size and/or use of iterative reconstruction technique. CONTRAST:  128mL OMNIPAQUE IOHEXOL 300 MG/ML  SOLN COMPARISON:  03/08/2014 FINDINGS: Lower chest: No acute pleural or parenchymal lung disease.  Hepatobiliary: Hepatic steatosis. No focal liver abnormality. Gallbladder is unremarkable. No intrahepatic duct dilation. Pancreas: Unremarkable. No pancreatic ductal dilatation or surrounding inflammatory changes. Spleen: Normal in size without focal abnormality. Adrenals/Urinary Tract: Adrenal glands are unremarkable. Kidneys are normal, without renal calculi, focal lesion, or hydronephrosis. Bladder is unremarkable. Stomach/Bowel: No bowel obstruction or ileus. Gas fluid levels throughout the colon to the level of the rectum consistent with given history of diarrhea. Normal appendix right lower quadrant. No bowel wall thickening or inflammatory change. Vascular/Lymphatic: No significant vascular findings are present. No enlarged abdominal or pelvic lymph nodes. Reproductive: Prostate is unremarkable. Other: No free fluid or free intraperitoneal gas. No abdominal wall hernia. Musculoskeletal: No acute or destructive bony lesions. Reconstructed images demonstrate no additional findings. IMPRESSION: 1. Diffuse colonic gas fluid levels consistent with history of diarrhea. No bowel obstruction  or ileus. 2. Hepatic steatosis. Electronically Signed   By: Sharlet Salina M.D.   On: 09/03/2022 19:00     Subjective: Patient seen examined at bedside, resting calmly.  Sitting on couch working on his computer.  No specific complaints this morning.  Reports has had 1 solid bowel movement now.  Feels ready for discharge home.  Electrolytes now stable.  Discussed with patient to continue cholestyramine twice daily and slowly wean off as bowel movements continue to improve.  Recommend follow-up with PCP in 1 week and Dr. Pati Gallo as needed.  No other questions or concerns at this time.  Denies headache, no dizziness, no chest pain, palpitations, no shortness of breath, no abdominal pain, no focal weakness, no fever/chills/night sweats, no nausea/vomiting, no current diarrhea, no fatigue, no paresthesias.  No acute events  overnight per nursing staff.  Discharge Exam: Vitals:   09/10/22 0549 09/10/22 0803  BP: 128/85 136/77  Pulse: 83 88  Resp: 18 20  Temp: 97.9 F (36.6 C) 98.3 F (36.8 C)  SpO2: 100% 100%   Vitals:   09/09/22 1641 09/09/22 2056 09/10/22 0549 09/10/22 0803  BP: 136/77 (!) 140/80 128/85 136/77  Pulse: 95 100 83 88  Resp: 18 17 18 20   Temp: 98.8 F (37.1 C) 99.1 F (37.3 C) 97.9 F (36.6 C) 98.3 F (36.8 C)  TempSrc: Oral Oral Oral Oral  SpO2: 100% 98% 100% 100%  Weight:  108.8 kg    Height:  5\' 9"  (1.753 m)      Physical Exam: GEN: NAD, alert and oriented x 3, wd/wn HEENT: NCAT, PERRL, EOMI, sclera clear, MMM PULM: CTAB w/o wheezes/crackles, normal respiratory effort, on room air CV: RRR w/o M/G/R GI: abd soft, NTND, NABS, no R/G/M MSK: no peripheral edema, muscle strength globally intact 5/5 bilateral upper/lower extremities NEURO: CN II-XII intact, no focal deficits, sensation to light touch intact PSYCH: normal mood/affect Integumentary: dry/intact, no rashes or wounds    The results of significant diagnostics from this hospitalization (including imaging, microbiology, ancillary and laboratory) are listed below for reference.     Microbiology: Recent Results (from the past 240 hour(s))  C Difficile Quick Screen w PCR reflex     Status: None   Collection Time: 09/04/22 11:30 AM   Specimen: STOOL  Result Value Ref Range Status   C Diff antigen NEGATIVE NEGATIVE Final   C Diff toxin NEGATIVE NEGATIVE Final   C Diff interpretation No C. difficile detected.  Final    Comment: Performed at [facility], [address], [place], [address] [place]  Gastrointestinal Panel by PCR , Stool     Status: None   Collection Time: 09/04/22 11:30 AM   Specimen: STOOL  Result Value Ref Range Status   Campylobacter species NOT DETECTED NOT DETECTED Final   Plesimonas shigelloides NOT DETECTED NOT DETECTED Final   Salmonella species NOT DETECTED NOT DETECTED Final   Yersinia  enterocolitica NOT DETECTED NOT DETECTED Final   Vibrio species NOT DETECTED NOT DETECTED Final   Vibrio cholerae NOT DETECTED NOT DETECTED Final   Enteroaggregative E coli (EAEC) NOT DETECTED NOT DETECTED Final   Enteropathogenic E coli (EPEC) NOT DETECTED NOT DETECTED Final   Enterotoxigenic E coli (ETEC) NOT DETECTED NOT DETECTED Final   Shiga like toxin producing E coli (STEC) NOT DETECTED NOT DETECTED Final   Shigella/Enteroinvasive E coli (EIEC) NOT DETECTED NOT DETECTED Final   Cryptosporidium NOT DETECTED NOT DETECTED Final   Cyclospora cayetanensis NOT DETECTED NOT DETECTED Final  Entamoeba histolytica NOT DETECTED NOT DETECTED Final   Giardia lamblia NOT DETECTED NOT DETECTED Final   Adenovirus F40/41 NOT DETECTED NOT DETECTED Final   Astrovirus NOT DETECTED NOT DETECTED Final   Norovirus GI/GII NOT DETECTED NOT DETECTED Final   Rotavirus A NOT DETECTED NOT DETECTED Final   Sapovirus (I, II, IV, and V) NOT DETECTED NOT DETECTED Final    Comment: Performed at [facility], [address] [place], [place] [address]  Gastrointestinal Panel by PCR , Stool     Status: None   Collection Time: 09/07/22  1:35 PM   Specimen: Stool  Result Value Ref Range Status   Campylobacter species NOT DETECTED NOT DETECTED Final   Plesimonas shigelloides NOT DETECTED NOT DETECTED Final   Salmonella species NOT DETECTED NOT DETECTED Final   Yersinia enterocolitica NOT DETECTED NOT DETECTED Final   Vibrio species NOT DETECTED NOT DETECTED Final   Vibrio cholerae NOT DETECTED NOT DETECTED Final   Enteroaggregative E coli (EAEC) NOT DETECTED NOT DETECTED Final   Enteropathogenic E coli (EPEC) NOT DETECTED NOT DETECTED Final   Enterotoxigenic E coli (ETEC) NOT DETECTED NOT DETECTED Final   Shiga like toxin producing E coli (STEC) NOT DETECTED NOT DETECTED Final   Shigella/Enteroinvasive E coli (EIEC) NOT DETECTED NOT DETECTED Final   Cryptosporidium NOT DETECTED NOT DETECTED Final    Cyclospora cayetanensis NOT DETECTED NOT DETECTED Final   Entamoeba histolytica NOT DETECTED NOT DETECTED Final   Giardia lamblia NOT DETECTED NOT DETECTED Final   Adenovirus F40/41 NOT DETECTED NOT DETECTED Final   Astrovirus NOT DETECTED NOT DETECTED Final   Norovirus GI/GII NOT DETECTED NOT DETECTED Final   Rotavirus A NOT DETECTED NOT DETECTED Final   Sapovirus (I, II, IV, and V) NOT DETECTED NOT DETECTED Final    Comment: Performed at [facility], [address] [place], [place] [address]     Labs: BNP (last 3 results) No results for input(s): "BNP" in the last 8760 hours. Basic Metabolic Panel: Recent Labs  Lab 09/03/22 1747 09/04/22 0457 09/05/22 0313 09/07/22 0731 09/08/22 0510 09/09/22 0638 09/10/22 0421  NA  --    < > 138 134* 135 137 139  K  --    < > 3.8 3.9 3.4* 3.1* 3.9  CL  --    < > 107 108 108 109 112*  CO2  --    < > 24 14* 18* 20* 23  GLUCOSE  --    < > 114* 152* 114* 99 95  BUN  --    < > 21* 10 10 7 6   CREATININE  --    < > 1.25* 1.46* 1.24 1.08 1.07  CALCIUM  --    < > 8.4* 8.8* 8.3* 8.3* 7.9*  MG 2.7*  --   --  1.8  --  1.7 2.0   < > = values in this interval not displayed.   Liver Function Tests: Recent Labs  Lab 09/03/22 1737 09/05/22 0313 09/07/22 0731  AST 68* 33 28  ALT 87* 50* 38  ALKPHOS 96 72 78  BILITOT 0.9 0.6 0.4  PROT 8.5* 6.3* 7.3  ALBUMIN 5.0 3.6 3.9   Recent Labs  Lab 09/03/22 1737 09/07/22 0731  LIPASE 45 32   No results for input(s): "AMMONIA" in the last 168 hours. CBC: Recent Labs  Lab 09/03/22 1737 09/04/22 0457 09/05/22 0313 09/07/22 0731 09/08/22 0510  WBC 9.5 7.0 5.6 10.5 8.1  NEUTROABS  --   --   --  6.6  --   HGB 19.9* 17.3* 15.9 20.4* 15.9  HCT 59.3* 51.8 48.7 59.9* 47.7  MCV 80.2 81.3 83.7 80.9 81.8  PLT 356 268 234 338 233   Cardiac Enzymes: No results for input(s): "CKTOTAL", "CKMB", "CKMBINDEX", "TROPONINI" in the last 168 hours. BNP: Invalid input(s): "POCBNP" CBG: No results  for input(s): "GLUCAP" in the last 168 hours. D-Dimer No results for input(s): "DDIMER" in the last 72 hours. Hgb A1c No results for input(s): "HGBA1C" in the last 72 hours. Lipid Profile Recent Labs    09/07/22 1200  CHOL 76  HDL 27*  LDLCALC 19  TRIG 151*  CHOLHDL 2.8   Thyroid function studies No results for input(s): "TSH", "T4TOTAL", "T3FREE", "THYROIDAB" in the last 72 hours.  Invalid input(s): "FREET3" Anemia work up No results for input(s): "VITAMINB12", "FOLATE", "FERRITIN", "TIBC", "IRON", "RETICCTPCT" in the last 72 hours. Urinalysis    Component Value Date/Time   COLORURINE YELLOW 09/07/2022 1027   APPEARANCEUR CLEAR 09/07/2022 1027   LABSPEC 1.010 09/07/2022 1027   PHURINE 6.0 09/07/2022 1027   GLUCOSEU NEGATIVE 09/07/2022 1027   HGBUR NEGATIVE 09/07/2022 1027   [place] 09/07/2022 1027   KETONESUR NEGATIVE 09/07/2022 1027   PROTEINUR NEGATIVE 09/07/2022 1027   UROBILINOGEN 0.2 03/08/2014 2117   NITRITE NEGATIVE 09/07/2022 1027   LEUKOCYTESUR NEGATIVE 09/07/2022 1027   Sepsis Labs Recent Labs  Lab 09/04/22 0457 09/05/22 0313 09/07/22 0731 09/08/22 0510  WBC 7.0 5.6 10.5 8.1   Microbiology Recent Results (from the past 240 hour(s))  C Difficile Quick Screen w PCR reflex     Status: None   Collection Time: 09/04/22 11:30 AM   Specimen: STOOL  Result Value Ref Range Status   C Diff antigen NEGATIVE NEGATIVE Final   C Diff toxin NEGATIVE NEGATIVE Final   C Diff interpretation No C. difficile detected.  Final    Comment: Performed at [facility], [address], [place], [place] [address]  Gastrointestinal Panel by PCR , Stool     Status: None   Collection Time: 09/04/22 11:30 AM   Specimen: STOOL  Result Value Ref Range Status   Campylobacter species NOT DETECTED NOT DETECTED Final   Plesimonas shigelloides NOT DETECTED NOT DETECTED Final   Salmonella species NOT DETECTED NOT DETECTED Final   Yersinia enterocolitica NOT DETECTED NOT  DETECTED Final   Vibrio species NOT DETECTED NOT DETECTED Final   Vibrio cholerae NOT DETECTED NOT DETECTED Final   Enteroaggregative E coli (EAEC) NOT DETECTED NOT DETECTED Final   Enteropathogenic E coli (EPEC) NOT DETECTED NOT DETECTED Final   Enterotoxigenic E coli (ETEC) NOT DETECTED NOT DETECTED Final   Shiga like toxin producing E coli (STEC) NOT DETECTED NOT DETECTED Final   Shigella/Enteroinvasive E coli (EIEC) NOT DETECTED NOT DETECTED Final   Cryptosporidium NOT DETECTED NOT DETECTED Final   Cyclospora cayetanensis NOT DETECTED NOT DETECTED Final   Entamoeba histolytica NOT DETECTED NOT DETECTED Final   Giardia lamblia NOT DETECTED NOT DETECTED Final   Adenovirus F40/41 NOT DETECTED NOT DETECTED Final   Astrovirus NOT DETECTED NOT DETECTED Final   Norovirus GI/GII NOT DETECTED NOT DETECTED Final   Rotavirus A NOT DETECTED NOT DETECTED Final   Sapovirus (I, II, IV, and V) NOT DETECTED NOT DETECTED Final    Comment: Performed at [facility], [address] [place], [place] [address]  Gastrointestinal Panel by PCR , Stool     Status: None   Collection Time: 09/07/22  1:35 PM  Specimen: Stool  Result Value Ref Range Status   Campylobacter species NOT DETECTED NOT DETECTED Final   Plesimonas shigelloides NOT DETECTED NOT DETECTED Final   Salmonella species NOT DETECTED NOT DETECTED Final   Yersinia enterocolitica NOT DETECTED NOT DETECTED Final   Vibrio species NOT DETECTED NOT DETECTED Final   Vibrio cholerae NOT DETECTED NOT DETECTED Final   Enteroaggregative E coli (EAEC) NOT DETECTED NOT DETECTED Final   Enteropathogenic E coli (EPEC) NOT DETECTED NOT DETECTED Final   Enterotoxigenic E coli (ETEC) NOT DETECTED NOT DETECTED Final   Shiga like toxin producing E coli (STEC) NOT DETECTED NOT DETECTED Final   Shigella/Enteroinvasive E coli (EIEC) NOT DETECTED NOT DETECTED Final   Cryptosporidium NOT DETECTED NOT DETECTED Final   Cyclospora cayetanensis NOT DETECTED  NOT DETECTED Final   Entamoeba histolytica NOT DETECTED NOT DETECTED Final   Giardia lamblia NOT DETECTED NOT DETECTED Final   Adenovirus F40/41 NOT DETECTED NOT DETECTED Final   Astrovirus NOT DETECTED NOT DETECTED Final   Norovirus GI/GII NOT DETECTED NOT DETECTED Final   Rotavirus A NOT DETECTED NOT DETECTED Final   Sapovirus (I, II, IV, and V) NOT DETECTED NOT DETECTED Final    Comment: Performed at [facility], [address], [place], [place] [address]     Time coordinating discharge: Over 30 minutes  SIGNED:   Alvira Philips [place], DO  [facility] 09/10/2022, 9:07 AM

## 2022-09-10 NOTE — Progress Notes (Signed)
DISCHARGE NOTE HOME CEBERT DETTMANN to be discharged Home per MD order. Discussed prescriptions and follow up appointments with the patient. Prescriptions given to patient; medication list explained in detail. Patient verbalized understanding.  Skin clean, dry and intact without evidence of skin break down, no evidence of skin tears noted. IV catheter discontinued intact. Site without signs and symptoms of complications. Dressing and pressure applied. Pt denies pain at the site currently. No complaints noted.  Patient free of lines, drains, and wounds.   An After Visit Summary (AVS) was printed and given to the patient. Patient escorted via wheelchair, and discharged home via private auto.  Hassell Halim, LPN

## 2022-09-10 NOTE — Discharge Instructions (Signed)
Continue cholestyramine twice daily on discharge, slowly wean off as diarrhea improves with more solid bowel movements.

## 2022-09-11 ENCOUNTER — Ambulatory Visit: Payer: Self-pay

## 2022-09-11 NOTE — Patient Outreach (Signed)
  Care Coordination   09/11/2022 Name: Richard Hull MRN: [id number] DOB: April 15, 1976   Care Coordination Outreach Attempts:  An unsuccessful telephone outreach was attempted for a scheduled appointment today.  Follow Up Plan:  Additional outreach attempts will be made to offer the patient care coordination information and services.   Encounter Outcome:  No Answer   Care Coordination Interventions:  No, not indicated    Barb Merino, RN, BSN, CCM Care Management Coordinator [facility] Care Management  Direct Phone: [phone]

## 2022-09-13 ENCOUNTER — Telehealth: Payer: Self-pay | Admitting: Podiatry

## 2022-09-13 NOTE — Telephone Encounter (Signed)
Lmom for patient to call back , orthotics are in , need to set up time to try them on.     No balance

## 2022-09-14 DIAGNOSIS — I119 Hypertensive heart disease without heart failure: Secondary | ICD-10-CM | POA: Diagnosis not present

## 2022-09-14 DIAGNOSIS — A084 Viral intestinal infection, unspecified: Secondary | ICD-10-CM | POA: Diagnosis not present

## 2022-09-20 ENCOUNTER — Telehealth: Payer: Self-pay | Admitting: *Deleted

## 2022-09-20 NOTE — Progress Notes (Signed)
  Care Coordination Note  09/20/2022 Name: DECORIAN SCHUENEMANN MRN: [id number] DOB: 08-25-1975  VINCEN BEJAR is a 47 y.o. year old male who is a primary care patient of Shon Baton, MD and is actively engaged with the care management team. I reached out to [name] by phone today to assist with re-scheduling a follow up visit with the RN Case Manager  Follow up plan: Unsuccessful telephone outreach attempt made.  [place]  Direct Dial: [phone]

## 2022-09-24 DIAGNOSIS — G4733 Obstructive sleep apnea (adult) (pediatric): Secondary | ICD-10-CM | POA: Diagnosis not present

## 2022-09-24 NOTE — Progress Notes (Signed)
  Care Coordination Note  09/24/2022 Name: Richard Hull MRN: [id number] DOB: Mar 16, 1976  Richard Hull is a 47 y.o. year old male who is a primary care patient of Shon Baton, MD and is actively engaged with the care management team. I reached out to [name] by phone today to assist with re-scheduling a follow up visit with the RN Case Manager  Follow up plan: Telephone appointment with care management team member scheduled for:10/08/22  [place]: [phone]

## 2022-10-01 ENCOUNTER — Ambulatory Visit (INDEPENDENT_AMBULATORY_CARE_PROVIDER_SITE_OTHER): Payer: BC Managed Care – PPO

## 2022-10-01 DIAGNOSIS — M722 Plantar fascial fibromatosis: Secondary | ICD-10-CM

## 2022-10-03 NOTE — Progress Notes (Signed)
Patient presents today to pick up custom molded foot orthotics.  Orthotics were dispensed and fit was satisfactory. Reviewed instructions for break-in and wear. Written instructions given to patient.  Patient will follow up as needed.

## 2022-10-08 ENCOUNTER — Ambulatory Visit: Payer: Self-pay

## 2022-10-08 NOTE — Patient Outreach (Signed)
  Care Coordination   10/08/2022 Name: Richard Hull MRN: [id number] DOB: May 03, 1976   Care Coordination Outreach Attempts:  An unsuccessful telephone outreach was attempted for a scheduled appointment today.  Follow Up Plan:  Additional outreach attempts will be made to offer the patient care coordination information and services.   Encounter Outcome:  No Answer   Care Coordination Interventions:  No, not indicated    Barb Merino, RN, BSN, CCM Care Management Coordinator [facility] Care Management  Direct Phone: [phone]

## 2022-10-22 DIAGNOSIS — G4733 Obstructive sleep apnea (adult) (pediatric): Secondary | ICD-10-CM | POA: Diagnosis not present

## 2022-10-23 DIAGNOSIS — G4733 Obstructive sleep apnea (adult) (pediatric): Secondary | ICD-10-CM | POA: Diagnosis not present

## 2022-11-12 ENCOUNTER — Encounter: Payer: Self-pay | Admitting: Cardiovascular Disease

## 2022-11-12 ENCOUNTER — Ambulatory Visit: Payer: BC Managed Care – PPO | Attending: Cardiovascular Disease | Admitting: Cardiovascular Disease

## 2022-11-12 DIAGNOSIS — E782 Mixed hyperlipidemia: Secondary | ICD-10-CM | POA: Diagnosis not present

## 2022-11-12 DIAGNOSIS — I10 Essential (primary) hypertension: Secondary | ICD-10-CM

## 2022-11-12 DIAGNOSIS — M5412 Radiculopathy, cervical region: Secondary | ICD-10-CM

## 2022-11-12 DIAGNOSIS — G4733 Obstructive sleep apnea (adult) (pediatric): Secondary | ICD-10-CM | POA: Diagnosis not present

## 2022-11-12 DIAGNOSIS — I252 Old myocardial infarction: Secondary | ICD-10-CM

## 2022-11-12 DIAGNOSIS — E669 Obesity, unspecified: Secondary | ICD-10-CM

## 2022-11-12 NOTE — Progress Notes (Signed)
Patient ID: Richard Hull, male   DOB: 1975/11/26, 47 y.o.   MRN: [id number]     Primary M.D.: Richard Hull  HPI: Richard Hull is a 47 y.o. male who presents to the office today for an 4 month follow up cardiology/sleep evaluation.  Richard Hull has a history of hyperlipidemia as well as hypertension.  On the morning of 09/15/2015 he was awakened from sleep with severe substernal chest pressure.  He presented to [facility] hospital emergency room where his ECG showed sinus tachycardia with inferior ST segment elevation.  A code STEMI was activated.  He underwent emergent cardiac catheterization by me, which revealed an ulcerated plaque in a large large dominant RCA with distal embolization to the PDA.  Intervention was performed and he ultimately underwent stenting of his RCA proximal to the PDA with insertion of a 3.515 mm Xience Alpine DES stent postdilated 3.75 mm and PTCA of the subtotal mid PDA, which was small caliber and dilated with a 20 balloon.  He was found to have mild concomitant CAD with 30% narrowing in the mid to distal LAD and proximal 30% stenosis.  A subsequent echo done on 09/18/2015 showed an EF of 65-70% without wall motion abnormality.  There was grade 1 diastolic dysfunction.  He was seen for post hospital initial evaluation with Richard Hull.  He had developed a dry cough, which was felt to be due to lisinopril, but this ultimately resolved and he has been maintained on lisinopril 40 mg daily, metoprolol 50 mg twice a day, atorvastatin 80 mg with Zetia 10 mg.  He continues to be on dual antiplatelet therapy with aspirin and Brilinta.  He has been taking over-the-counter fish oil.  He has a history of markedly triglycerides and was as high as 1200 several years ago.  When I saw him I was concerned about obstructive sleep apnea in the etiology of his early a.m. MI which awakened him from sleep, suggesting the potential for oxygen desaturation during grams sleep.  I referred  him for a sleep study which confirmed my suspicion.  He was found to have severe obstructive sleep apnea with an AHI of 46.8 per hour.  Events were more severe with supine sleep.  He had abnormal sleep architecture with absence of slow-wave sleep and in rem sleep.  On the diagnostic portion of the study.  With CPAP.  He had significant rim rebound.  He had loud snoring.  During the diagnostic portion of the study.  He'll total.  He was titrated up to 10 cm water pressure has been on CPAP therapy since.  A download was obtained from his area 10 AutoSet ResMed CPAP unit from 05/12/2016 through October 2 22 2017.  He is meeting compliance standards.  Usage was 93%.  There were some days where the patient has awakened and his mask was off, but he did not recall taking it off.  At 10 cm water pressure.  AHI is excellent at 1.7 per hour.  He is unaware of any breakthrough snoring.  His sleep is now restorative.  Previously he had significant nocturia, which has essentially resolved.  He continues to have100% CPAP compliance.  He is awaiting a new mask since his current one has developed a significant leak.  When I last saw him in April 2018 as result of cervical disc disease he was having to undergo future  C4-C6 cervical disc surgery by Richard Hull.  When I had seen him previously, I recommended that  he wait at least one year following his acute coronary syndrome so that  dual platelet therapy be continued for one-year duration following his ACS.  A preoperative new nuclear perfusion study showed an EF at 54%.  There was a medium defect with mild ischemia in the basal inferior, mid inferior location.  I had recommended that he return to discuss this result.  He could not make the scheduled date that was initially recommended.  He comes to the office today, 2 months later for reevaluation.  He states he will require cervical neck surgery and has noticed slight progression of his left arm paresthesias.  He denied chest  pain.  He never had chest pain prior to his MI.  He had noticed some mild exertional dyspnea.   I saw him in December 2021 and had not seen him for 3-1/2 years prior to that evaluation. He was working work as a Therapist, sports in the produce department.  He has had some issues with low back discomfort involving his left hip and has been seeing an orthopedist at [place].  He admits to significant weight gain as result of Covid.  He had reduced his weight down to 190 and 219 and his weight is now increased to 245 pounds.  He missed several doses of his medication over the weekend and noted significant blood pressure elevation to 182/124.  He had not been routinely exercising.  His 83 year old son was diagnosed with Crohn's disease and to obtaining a diagnosis there was significant increased stress.  He has continued to be on CPAP therapy and cannot sleep without it. Presently he has been on amlodipine 10 mg, metoprolol tartrate 50 mg twice a day.  He has been on rosuvastatin 40 mg and over-the-counter fish oil 2 g twice a day.    He saw Richard Hull in November 2022 he was started on Leqvio for lipid management and continues to be on rosuvastatin 40 mg daily.  Apparently there was a period where he stopped a lot of his medications including beta-blocker therapy.  Presently he is on amlodipine 10 mg, olmesartan 40 mg, rosuvastatin 40 mg in addition to Leqvio every 84-month injections.  He is on Lexapro for depression.  He continues to use CPAP.  Blood was obtained today from April 19 through May 18 which shows excellent compliance with average use at 7 hours and 23 minutes per night.  CPAP is set at a pressure range of 10 to 20 cm and his AHI is 2.8.  He has had significant delta 3 with his most recent mask.  He is previous Respironics fullface mask was called back due to magnets and he now has been using a fullface mask with the tubing comes to the front which he does not like.  He had undergone  some laboratory in February 2023.  Total cholesterol was 67 HDL 42 LDL had dropped to 6 with triglycerides still elevated at 177.  He has not had recent thyroid studies or renal function ALT was increased at 68.  Denies any chest pain.  He typically goes to bed 9:30-10 and wakes up at 4 AM to get his children ready for school.  Also is going to school himself working on a nursing degree.  During that evaluation, I provided him with a new ResMed air fit F30i mask since his prior mask have been recalled.  During that evaluation evaluation he had stopped his beta-blocker therapy and his resting pulse was in the 90s to  100 range.  I recommended the addition of metoprolol 50 mg to take in addition to his olmesartan 40 mg and amlodipine.  I last saw him on July 03, 2022.  At that time, he continued to feel well and denied recurrent anginal symptoms.   Recently he has noticed a metallic taste in his mouth which he attributes secondary to olmesartan.  His blood pressure has continued to be somewhat labile and he has been taking amlodipine 10 mg, metoprolol succinate 50 mg, in addition to the olmesartan.  However when he did not take it olmesartan first several days metallic taste resolved.  It did improve somewhat when he tried taking it at night but again with resumption of treatment he noted a metallic taste.  He continues to be on inclisiran injection and has received 3 doses.  Laboratory in July showed total cholesterol 122 HDL 32 LDL 28 but triglycerides remain elevated at 443.  He was recently started on a prescription of Vascepa 2 capsules twice a day.  He was scheduled for his next incliserin dose in January.  He continues to use CPAP.  His machine is from 2017 and he qualified for new machine.  On his current pressure range of 10 to 20 cm, AHI is 3.4 and he is compliant with average use just under 8 hours per night.  He does have substantial mask leak.    Since I last saw him, he apparently had insurance  issues with inclisiran and did not have his January dose.  He received a new ResMed AirSense 11 AutoSet unit on July 24, 2022.  Subsequent downloads have consistently shown 100% compliance with usage greater than 8 hours.  At a pressure range of 11 to 20 cm of water, AHI is excellent at 1.9 from December 5 January 3 and 1.7/h from February 22 to November 09, 2022  Clinically, he feels well.  He has not been exercising and has been keeping busy working part-time at [employer] and also trying to go to school for respiratory therapy.  He does have some joint pain issues.  He is scheduled to have follow-up laboratory in 6 weeks with Richard Hull.  He denies chest pain, PND orthopnea.  He is sleeping well.  He presents for evaluation.  Past Medical History:  Diagnosis Date   Cervical radiculopathy at C5 01/27/2016   left   Coronary artery disease 08/2015   Depression    GERD (gastroesophageal reflux disease)    History of kidney stones    Hyperlipidemia LDL goal <70    Hypertension    Hypertriglyceridemia    Left arm weakness    Myocardial infarction ([place]) 08/2015   OCD (obsessive compulsive disorder)    Sleep apnea     Past Surgical History:  Procedure Laterality Date   ANTERIOR CERVICAL DECOMP/DISCECTOMY FUSION N/A 05/01/2017   Procedure: Anterior Cervical Decompression Fusion Cervical Four-Five Cervical Five-Six;  Surgeon: Jovita Gamma, MD;  Location: [facility];  Service: Neurosurgery;  Laterality: N/A;  Anterior Cervical Decompression Fusion Cervical Four-Five Cervical Five-Six   CARDIAC CATHETERIZATION N/A 09/15/2015   Procedure: Left Heart Cath and Coronary Angiography;  Surgeon: Troy Sine, MD; LAD 30%, pRCA 30%, dRCA 30%, RPDA 99%, nl LV function    CARDIAC CATHETERIZATION N/A 09/15/2015   Procedure: Coronary Stent Intervention;  Surgeon: Troy Sine, MD; 3.515 mm Xience Alpine DES to dRCA/RPDA>> 5% residual      LEFT HEART CATH AND CORONARY ANGIOGRAPHY N/A 11/28/2016   Procedure: Left  Heart Cath and Coronary Angiography;  Surgeon: Troy Sine, MD;  Location: [facility];  Service: Cardiovascular;  Laterality: N/A;   TONSILLECTOMY      Allergies  Allergen Reactions   Benicar [Olmesartan] Other (See Comments)    Metallic taste   Amoxil [Amoxicillin] Rash   Penicillins Rash and Other (See Comments)    Has patient had a PCN reaction causing immediate rash, facial/tongue/throat swelling, SOB or lightheadedness with hypotension: Yes Has patient had a PCN reaction causing severe rash involving mucus membranes or skin necrosis: No Has patient had a PCN reaction that required hospitalization No Has patient had a PCN reaction occurring within the last 10 years: No If all of the above answers are "NO", then may proceed with Cephalosporin use.     Current Outpatient Medications  Medication Sig Dispense Refill   amLODipine (NORVASC) 10 MG tablet Take 1 tablet by mouth in the morning.     aspirin EC 81 MG EC tablet Take 1 tablet (81 mg total) by mouth daily. (Patient taking differently: Take 81 mg by mouth every evening.)     cholestyramine light (PREVALITE) 4 g packet Take 1 packet (4 g total) by mouth 2 (two) times daily. Slowly wean off once diarrhea improves 60 packet 0   dicyclomine (BENTYL) 20 MG tablet Take 1 tablet (20 mg total) by mouth 3 (three) times daily as needed for spasms (Abdominal cramping/spasms). 60 tablet 0   icosapent Ethyl (VASCEPA) 1 g capsule Take 2 capsules (2 g total) by mouth 2 (two) times daily. 360 capsule 3   loperamide (IMODIUM) 2 MG capsule Take 1 capsule (2 mg total) by mouth as needed for diarrhea or loose stools. 30 capsule 0   metoprolol succinate (TOPROL-XL) 50 MG 24 hr tablet Take 1 tablet (50 mg total) by mouth daily. Take with or immediately following a meal. (Patient taking differently: Take 50 mg by mouth in the morning.) 90 tablet 3   nitroGLYCERIN (NITROSTAT) 0.4 MG SL tablet Place 1 tablet (0.4 mg total) under the tongue every 5  (five) minutes as needed for chest pain (CP or SOB). 25 tablet 12   Probiotic Product (PROBIOTIC PO) Take 1 capsule by mouth daily as needed (stomach discomfort).     rosuvastatin (CRESTOR) 20 MG tablet Take 1 tablet (20 mg total) by mouth daily. (Patient taking differently: Take 20 mg by mouth every evening.) 90 tablet 3   valsartan (DIOVAN) 160 MG tablet Take 1 tablet (160 mg total) by mouth daily. (Patient taking differently: Take 160 mg by mouth in the morning.) 90 tablet 3   No current facility-administered medications for this visit.    Social History   Socioeconomic History   Marital status: Legally Separated    Spouse name: Not on file   Number of children: 3   Years of education: Some Coll   Highest education level: Not on file  Occupational History   Occupation: Lawyer: [employer]  Tobacco Use   Smoking status: Never   Smokeless tobacco: Never  Vaping Use   Vaping Use: Never used  Substance and Sexual Activity   Alcohol use: Yes    Alcohol/week: 0.0 standard drinks of alcohol    Comment: Rarely   Drug use: No   Sexual activity: Yes  Other Topics Concern   Not on file  Social History Narrative   Multiple family members on his father's side have/had CAD at a young age, uncles and  grandparents.   Lives at home with his wife and three children.   Right-handed.   2-3 cups caffeine per week.   Social Determinants of Health   Financial Resource Strain: Not on file  Food Insecurity: No Food Insecurity (09/07/2022)   Hunger Vital Sign    Worried About Running Out of Food in the Last Year: Never true    Ran Out of Food in the Last Year: Never true  Transportation Needs: No Transportation Needs (09/07/2022)   PRAPARE - Hydrologist (Medical): No    Lack of Transportation (Non-Medical): No  Physical Activity: Not on file  Stress: Not on file  Social Connections: Not on file  Intimate Partner Violence: Not At Risk  (09/07/2022)   Humiliation, Afraid, Rape, and Kick questionnaire    Fear of Current or Ex-Partner: No    Emotionally Abused: No    Physically Abused: No    Sexually Abused: No   Additional social history is notable in that 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  When I saw him in 2018 he and his wife had separated.  He has 3 children.  His 67 year old son was diagnosed with Crohn's disease which is started on Remicade.  Family History  Problem Relation Age of Onset   Heart attack Father 36       approx age of first stent   Stroke Father    Hypertension Father    Skin cancer Mother    Hyperlipidemia Sister    Stroke Paternal Grandmother    Hypertension Paternal Grandmother    Hypertension Maternal Grandmother    Hypertension Maternal Grandfather    Diabetes Maternal Grandfather    Hypertension Paternal Grandfather    Diabetes Paternal Grandfather    Heart disease Paternal Grandfather     ROS General: Negative; No fevers, chills, or night sweats HEENT: Negative; No changes in vision or hearing, sinus congestion, difficulty swallowing Pulmonary: Negative; No cough, wheezing, shortness of breath, hemoptysis Cardiovascular: See HPI: No chest pain, presyncope, syncope, palpatations GI: Negative; No nausea, vomiting, diarrhea, or abdominal pain GU: Negative; No dysuria, hematuria, or difficulty voiding Musculoskeletal: He is in need for cervical C3-6 surgery; occasional hip discomfort.  Stiff back Hematologic: Negative; no easy bruising, bleeding Endocrine: Negative; no heat/cold intolerance; no diabetes, Neuro: Negative; no changes in balance, headaches Skin: Negative; No rashes or skin lesions Psychiatric: Negative; No behavioral problems, depression Sleep: Positive for snoring, and occasional daytime sleepiness now on CPAP therapy with resolution of symptoms; no bruxism, restless legs, hypnogognic hallucinations. Other comprehensive 14 point system review  is negative   Physical Exam BP 128/74 (BP Location: Left Arm, Patient Position: Sitting, Cuff Size: Large)   Pulse 82   Ht 5\' 9"  (1.753 m)   Wt 250 lb (113.4 kg)   BMI 36.92 kg/m    Repeat blood pressure by me was 124/74  Wt Readings from Last 3 Encounters:  11/12/22 250 lb (113.4 kg)  09/09/22 239 lb 13.8 oz (108.8 kg)  09/04/22 239 lb 3.2 oz (108.5 kg)   General: Alert, oriented, no distress.  Skin: normal turgor, no rashes, warm and dry HEENT: Normocephalic, atraumatic. Pupils equal round and reactive to light; sclera anicteric; extraocular muscles intact;  Nose without nasal septal hypertrophy Mouth/Parynx benign; Mallinpatti scale 3/4 Neck: Thick neck; no JVD, no carotid bruits; normal carotid upstroke Lungs: clear to ausculatation and percussion; no wheezing or rales Chest wall: without tenderness to palpitation Heart:  PMI not displaced, RRR, s1 s2 normal, 1/6 systolic murmur, no diastolic murmur, no rubs, gallops, thrills, or heaves Abdomen: soft, nontender; no hepatosplenomehaly, BS+; abdominal aorta nontender and not dilated by palpation. Back: no CVA tenderness Pulses 2+ Musculoskeletal: full range of motion, normal strength, no joint deformities Extremities: no clubbing cyanosis or edema, Homan's sign negative  Neurologic: grossly nonfocal; Cranial nerves grossly wnl Psychologic: Normal mood and affect  November 12, 2022 ECG (independently read by me): NSR at 82  July 03, 2022 ECG (independently read by me): Normal sinus rhythm at 90 bpm.  No ectopy.  Normal intervals.  No ST segment changes or evidence of prior infarction  Jan 08, 2022 ECG (independently read by me): Normal sinus rhythm at 96 bpm.  Right axis.  Poor R wave progression.  July 27, 2020 ECG (independently read by me): Normal sinus rhythm at 79 bpm.  T wave inversion in lead III.  No ectopy.  Normal intervals.  November 20, 2016  ECG (independently read by me): Normal sinus rhythm at 63 bpm.  Mild T  wave inversion in III.  January 2018 ECG (independently read by me): Normal sinus rhythm at 78 bpm.  PR interval 142 ms, QTc interval 435 ms.  No ECG evidence for his prior myocardial infarction.  October 2017 ECG (independently read by me): Normal sinus rhythm at 91 bpm.  No ectopy.  Normal intervals.  May 2017 ECG (independently read by me): Normal sinus rhythm at 73 bpm.  Small nondiagnostic inferior Q waves with T-wave inversion.  LABS:     Latest Ref Rng & Units 09/10/2022    4:21 AM 09/09/2022    6:38 AM 09/08/2022    5:10 AM  BMP  Glucose 70 - 99 mg/dL 95  99  114   BUN 6 - 20 mg/dL 6  7  10    Creatinine 0.61 - 1.24 mg/dL 1.07  1.08  1.24   Sodium 135 - 145 mmol/L 139  137  135   Potassium 3.5 - 5.1 mmol/L 3.9  3.1  3.4   Chloride 98 - 111 mmol/L 112  109  108   CO2 22 - 32 mmol/L 23  20  18    Calcium 8.9 - 10.3 mg/dL 7.9  8.3  8.3         Latest Ref Rng & Units 09/07/2022    7:31 AM 09/05/2022    3:13 AM 09/03/2022    5:37 PM  Hepatic Function  Total Protein 6.5 - 8.1 g/dL 7.3  6.3  8.5   Albumin 3.5 - 5.0 g/dL 3.9  3.6  5.0   AST 15 - 41 U/L 28  33  68   ALT 0 - 44 U/L 38  50  87   Alk Phosphatase 38 - 126 U/L 78  72  96   Total Bilirubin 0.3 - 1.2 mg/dL 0.4  0.6  0.9        Latest Ref Rng & Units 09/08/2022    5:10 AM 09/07/2022    7:31 AM 09/05/2022    3:13 AM  CBC  WBC 4.0 - 10.5 K/uL 8.1  10.5  5.6   Hemoglobin 13.0 - 17.0 g/dL 15.9  20.4  15.9   Hematocrit 39.0 - 52.0 % 47.7  59.9  48.7   Platelets 150 - 400 K/uL 233  338  234    Lab Results  Component Value Date   MCV 81.8 09/08/2022   MCV 80.9 09/07/2022   MCV 83.7  09/05/2022    Lab Results  Component Value Date   TSH 4.560 (H) 02/21/2022    BNP No results found for: "BNP"  ProBNP No results found for: "PROBNP"   Lipid Panel     Component Value Date/Time   CHOL 76 09/07/2022 1200   CHOL 122 02/21/2022 1213   TRIG 151 (H) 09/07/2022 1200   HDL 27 (L) 09/07/2022 1200   HDL 32 (L)  02/21/2022 1213   CHOLHDL 2.8 09/07/2022 1200   VLDL 30 09/07/2022 1200   LDLCALC 19 09/07/2022 1200   LDLCALC 28 02/21/2022 1213     RADIOLOGY: No results found.  IMPRESSION:  1. History of MI (myocardial infarction): PCI DES stent to RCA 09/15/2015   2. OSA (obstructive sleep apnea)   3. Essential hypertension   4. Mixed hyperlipidemia   5. Cervical radiculopathy at C5   6. Obesity, Class II, BMI 35-39.9     ASSESSMENT AND PLAN: Mr. Richard Hull is a 47 year old male who had a history of previously untreated hyperlipidemia, as well as hypertension.  He was awakened from sleep on the morning of 09/15/2015 with chest pain and was found to have inferior ST elevation.  He underwent successful emergent catheterization and intervention to an ulcerated plaque in the RCA proximal to the PDA takeoff and there was evidence for distal embolization of clot to the PDA.  He underwent successful stenting at the site of the ulcerated plaque and PTCA in the distal PDA.  Since his MI awakened him from sleep I was concerned about sleep apnea which may have been contributory to his symptomatology.  This may have contributed to increased inflammation with ultimate plaque rupture during potential hypoxemia.  He was found to have severe obstructive sleep apnea with an AHI of 46.8 per hour.   CPAP therapy was initiated and he is continue to use CPAP excellent compliance and does not sleep without it.  A downloadfrom November 7 through July 25, 2020 showed 100% compliance with average use at 7 hours.  He is CPAP auto is set at a minimum pressure of 10 and maximum of 20 and his 95th percentile pressure is 12.7 with a maximum average pressure at 14.1.  AHI is 3.4.  He continues to note the benefits of CPAP with being more alert, no longer snoring, marked improvement in previous significant nocturia and lack of daytime sleepiness.  At his evaluation with me in May 2023 I provided him with a new ResMed air fit F30i  mask which she has been using and he believes does well.  However his download today confirms he still having mask leak.  A download from October 15 through July 02, 2022 continues to show excellent compliance.  At his pressure range of 10 to 20 cm of water, AHI is 3.4.  At that evaluation I increased his pressure range to 11 to 20 cm of water but I suspect the majority of his residual AHI may be contributed by his mask leak.  At that time, he qualified new machine he subsequently received a new with set up date on July 24, 2022.  His download shows excellent compliance with 100% use and average use at least 8 hours per night.  Downloads are excellent with an AHI of 1.7/h most recently, however he continues to have daily mask leak which may be responsible for his water for humidification not lasting the entire night.  Presently, his blood pressure is well-controlled on his regimen of amlodipine 10 mg, metoprolol  succinate 50 mg, and valsartan 160 mg daily.  He continues to be on Vascepa 2 capsules twice a day in addition to rosuvastatin 20 mg for mixed hyperlipidemia.  Apparently there have been some insurance issues and he did not receive his dose of inclisiran in January.  He will be seeing Richard Hull in several weeks.  Laboratory from August 28, 2016 2024 showed LDL cholesterol at 19 HDL cholesterol 27 triglycerides remain minimally increased at 151.  BMI is 36.9 with a weight of 250.  We again discussed the importance of weight loss and exercise.  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  We discussed the importance of weight loss.  He will continue current therapy.  I will see him in 1 year for reevaluation or sooner as needed.   Troy Sine, MD, [facility], [place], American Board of Sleep Medicine   11/12/2022 11:51 AM

## 2022-11-12 NOTE — Patient Instructions (Signed)
Medication Instructions:  No changes   *If you need a refill on your cardiac medications before your next appointment, please call your pharmacy*   Lab Work: Not needed    Testing/Procedures:  Not needed  Follow-Up: At [facility], you and your health needs are our priority.  As part of our continuing mission to provide you with exceptional heart care, we have created designated Provider Care Teams.  These Care Teams include your primary Cardiologist (physician) and Advanced Practice Providers (APPs -  Physician Assistants and Nurse Practitioners) who all work together to provide you with the care you need, when you need it.     Your next appointment:   12 month(s)  sleep  The format for your next appointment:   In Person  Provider:   Shelva Majestic, MD    Other Instructions  No pressure changes were done to your C-PAP

## 2022-11-14 ENCOUNTER — Ambulatory Visit: Payer: Self-pay

## 2022-11-14 NOTE — Patient Outreach (Signed)
  Care Coordination   Follow Up Visit Note   11/14/2022 Name: Richard Hull MRN: [id number] DOB: 11/24/1975  Richard Hull is a 47 y.o. year old male who sees Richard Baton, MD for primary care. I spoke with  Richard Hull by phone today.  What matters to the patients health and wellness today?  Patient will ask his PCP to check his thyroid function at next scheduled visit.     Goals Addressed             This Visit's Progress    To have thyroid function tested       Care Coordination Interventions: Evaluation of current treatment plan related to fatigue, weight gain and muscle pain  and patient's adherence to plan as established by provider Determined patient is experiencing increased fatigue, muscle pain and weight gain with unknown etiology Reviewed previously checked TSH and noted abnormal results Educated patient about the basic disease process related to abnormal thyroid function Discussed with patient he was not aware of past abnormal thyroid levels, he does not have known thyroid disease nor has he ever been prescribed to take thyroid medication Reviewed with patient his upcoming scheduled PCP with Richard Hull is scheduled for 01/04/23 @3 :63 PM Instructed patient to discuss his symptoms with PCP and ask for a thyroid blood test including TSH, T3, T4, thyroid antibody and Vitamin D level         COMPLETED: To recover from stomach virus       Care Coordination Interventions: Evaluation of current treatment plan related to gastroenteritis and patient's adherence to plan as established by provider Determined patient's GI symptoms resolved without complications, he completed a follow up with his GI MD with no changes made to his plan of care Instructed patient to notify his doctor promptly if symptoms reoccur       Interventions Today    Flowsheet Row Most Recent Value  Chronic Disease   Chronic disease during today's visit Hypertension (HTN), Other  [Thyroid disease]   General Interventions   General Interventions Discussed/Reviewed General Interventions Discussed, General Interventions Reviewed, Labs, Doctor Visits  Doctor Visits Discussed/Reviewed Doctor Visits Discussed, Doctor Visits Reviewed, PCP, Specialist  Education Interventions   Education Provided Provided Education  Provided Verbal Education On When to see the doctor, Labs          SDOH assessments and interventions completed:  No     Care Coordination Interventions:  Yes, provided   Follow up plan: Follow up call scheduled for 01/09/23 @2 :30 PM    Encounter Outcome:  Pt. Visit Completed

## 2022-11-14 NOTE — Patient Instructions (Signed)
Visit Information  Thank you for taking time to visit with me today. Please don't hesitate to contact me if I can be of assistance to you.   Following are the goals we discussed today:   Goals Addressed             This Visit's Progress    To have thyroid function tested       Care Coordination Interventions: Evaluation of current treatment plan related to fatigue, weight gain and muscle pain  and patient's adherence to plan as established by provider Determined patient is experiencing increased fatigue, muscle pain and weight gain with unknown etiology Reviewed previously checked TSH and noted abnormal results Educated patient about the basic disease process related to abnormal thyroid function Discussed with patient he was not aware of past abnormal thyroid levels, he does not have known thyroid disease nor has he ever been prescribed to take thyroid medication Reviewed with patient his upcoming scheduled PCP with [name] is scheduled for 01/04/23 @3 :30 PM Instructed patient to discuss his symptoms with PCP and ask for a thyroid blood test including TSH, T3, T4, thyroid antibody and Vitamin D level         COMPLETED: To recover from stomach virus       Care Coordination Interventions: Evaluation of current treatment plan related to gastroenteritis and patient's adherence to plan as established by provider Determined patient's GI symptoms resolved without complications, he completed a follow up with his GI MD with no changes made to his plan of care Instructed patient to notify his doctor promptly if symptoms reoccur           Our next appointment is by telephone on 01/09/23 at 2:30 PM  Please call the care guide team at [phone] if you need to cancel or reschedule your appointment.   If you are experiencing a Mental Health or [place] or need someone to talk to, please call 1-800-273-TALK (toll free, 24 hour hotline) go to [facility] [address] [place] [phone])  Patient verbalizes understanding of instructions and care plan provided today and agrees to view in [place]. Active MyChart status and patient understanding of how to access instructions and care plan via MyChart confirmed with patient.     Barb Merino, RN, BSN, CCM Care Management Coordinator [facility] Care Management Direct Phone: [phone]

## 2022-11-17 DIAGNOSIS — G4733 Obstructive sleep apnea (adult) (pediatric): Secondary | ICD-10-CM | POA: Diagnosis not present

## 2022-11-22 DIAGNOSIS — G4733 Obstructive sleep apnea (adult) (pediatric): Secondary | ICD-10-CM | POA: Diagnosis not present

## 2022-11-23 DIAGNOSIS — G4733 Obstructive sleep apnea (adult) (pediatric): Secondary | ICD-10-CM | POA: Diagnosis not present

## 2022-12-22 DIAGNOSIS — G4733 Obstructive sleep apnea (adult) (pediatric): Secondary | ICD-10-CM | POA: Diagnosis not present

## 2022-12-23 DIAGNOSIS — G4733 Obstructive sleep apnea (adult) (pediatric): Secondary | ICD-10-CM | POA: Diagnosis not present

## 2022-12-30 ENCOUNTER — Other Ambulatory Visit: Payer: Self-pay | Admitting: Cardiovascular Disease

## 2023-01-04 DIAGNOSIS — R140 Abdominal distension (gaseous): Secondary | ICD-10-CM | POA: Diagnosis not present

## 2023-01-04 DIAGNOSIS — I2510 Atherosclerotic heart disease of native coronary artery without angina pectoris: Secondary | ICD-10-CM | POA: Diagnosis not present

## 2023-01-04 DIAGNOSIS — M2550 Pain in unspecified joint: Secondary | ICD-10-CM | POA: Diagnosis not present

## 2023-01-04 DIAGNOSIS — E785 Hyperlipidemia, unspecified: Secondary | ICD-10-CM | POA: Diagnosis not present

## 2023-01-04 DIAGNOSIS — I119 Hypertensive heart disease without heart failure: Secondary | ICD-10-CM | POA: Diagnosis not present

## 2023-01-04 DIAGNOSIS — E119 Type 2 diabetes mellitus without complications: Secondary | ICD-10-CM | POA: Diagnosis not present

## 2023-01-04 DIAGNOSIS — R5383 Other fatigue: Secondary | ICD-10-CM | POA: Diagnosis not present

## 2023-01-09 ENCOUNTER — Ambulatory Visit: Payer: Self-pay

## 2023-01-09 NOTE — Patient Outreach (Signed)
  Care Coordination   01/09/2023 Name: Richard Hull MRN: [id number] DOB: 1975/11/13   Care Coordination Outreach Attempts:  An unsuccessful telephone outreach was attempted for a scheduled appointment today.  Follow Up Plan:  Additional outreach attempts will be made to offer the patient care coordination information and services.   Encounter Outcome:  No Answer   Care Coordination Interventions:  No, not indicated    Delsa Sale, RN, BSN, CCM Care Management Coordinator [facility] Care Management  Direct Phone: [phone]

## 2023-01-21 DIAGNOSIS — G4733 Obstructive sleep apnea (adult) (pediatric): Secondary | ICD-10-CM | POA: Diagnosis not present

## 2023-01-22 DIAGNOSIS — G4733 Obstructive sleep apnea (adult) (pediatric): Secondary | ICD-10-CM | POA: Diagnosis not present

## 2023-01-23 DIAGNOSIS — G4733 Obstructive sleep apnea (adult) (pediatric): Secondary | ICD-10-CM | POA: Diagnosis not present

## 2023-02-05 ENCOUNTER — Telehealth: Payer: Self-pay | Admitting: *Deleted

## 2023-02-05 NOTE — Progress Notes (Signed)
  Care Coordination Note  02/05/2023 Name: Richard Hull MRN: [id number] DOB: 1976-01-29  Richard Hull is a 47 y.o. year old male who is a primary care patient of [name], MD and is actively engaged with the care management team. I reached out to [name] by phone today to assist with re-scheduling a follow up visit with the RN Case Manager  Follow up plan: Unsuccessful telephone outreach attempt made. A HIPAA compliant phone message was left for the patient providing contact information and requesting a return call.   [facility]  Care Coordination Care Guide  Direct Dial: [phone]

## 2023-02-12 NOTE — Progress Notes (Signed)
  Care Coordination Note  02/12/2023 Name: Richard Hull MRN: [id number] DOB: [birth year]  Richard Hull is a 47 y.o. year old male who is a primary care patient of [name], MD and is actively engaged with the care management team. I reached out to [name] by phone today to assist with re-scheduling a follow up visit with the RN Case Manager  Follow up plan: Telephone appointment with care management team member scheduled for:7/17  [facility] Coordination Care Guide  Direct Dial: [phone]

## 2023-02-20 DIAGNOSIS — G4733 Obstructive sleep apnea (adult) (pediatric): Secondary | ICD-10-CM | POA: Diagnosis not present

## 2023-02-22 DIAGNOSIS — G4733 Obstructive sleep apnea (adult) (pediatric): Secondary | ICD-10-CM | POA: Diagnosis not present

## 2023-02-25 ENCOUNTER — Encounter (HOSPITAL_COMMUNITY): Payer: BC Managed Care – PPO

## 2023-02-28 DIAGNOSIS — R0683 Snoring: Secondary | ICD-10-CM | POA: Diagnosis not present

## 2023-02-28 DIAGNOSIS — J3503 Chronic tonsillitis and adenoiditis: Secondary | ICD-10-CM | POA: Diagnosis not present

## 2023-03-06 ENCOUNTER — Ambulatory Visit: Payer: Self-pay

## 2023-03-06 NOTE — Patient Outreach (Signed)
  Care Coordination   Follow Up Visit Note   03/06/2023 Name: AZARI HASLER MRN: [id number] DOB: [birth year]  BROOKS KINNAN is a 47 y.o. year old male who sees [name], MD for primary care. I spoke with  [name] by phone today.  What matters to the patients health and wellness today?  Patient would like to stay healthy by adhering to his prescribed treatment plan as directed.     Goals Addressed             This Visit's Progress    COMPLETED: To have thyroid function tested       Care Coordination Interventions: Evaluation of current treatment plan related to fatigue, weight gain and muscle pain  and patient's adherence to plan as established by provider Determined patient completed his PCP follow up for evaluation of thyroid and Vitamin d check  Reviewed and discussed with patient his Vitamin D was low, he does not recall the value, he was prescribed a prescription dose of Vitamin D  Reviewed medications with patient and discussed importance of medication adherence Discussed patient feels better overall and currently has no complaints or concerns related to his health     Interventions Today    Flowsheet Row Most Recent Value  Chronic Disease   Chronic disease during today's visit Other  [Vitamin d deficiency]  General Interventions   General Interventions Discussed/Reviewed General Interventions Discussed, General Interventions Reviewed, Labs, Doctor Visits  Doctor Visits Discussed/Reviewed Doctor Visits Discussed, Doctor Visits Reviewed, PCP  Education Interventions   Education Provided Provided Education  Provided Verbal Education On Labs, Medication, When to see the doctor  Labs Reviewed --  [Vitamin d]  Pharmacy Interventions   Pharmacy Dicussed/Reviewed Pharmacy Topics Discussed, Pharmacy Topics Reviewed, Medications and their functions          SDOH assessments and interventions completed:  No     Care Coordination Interventions:  Yes, provided    Follow up plan: No further intervention required.   Encounter Outcome:  Pt. Visit Completed

## 2023-03-06 NOTE — Patient Instructions (Signed)
Visit Information  Thank you for taking time to visit with me today. Please don't hesitate to contact me if I can be of assistance to you.   Following are the goals we discussed today:   Goals Addressed             This Visit's Progress    COMPLETED: To have thyroid function tested       Care Coordination Interventions: Evaluation of current treatment plan related to fatigue, weight gain and muscle pain  and patient's adherence to plan as established by provider Determined patient completed his PCP follow up for evaluation of thyroid and Vitamin d check  Reviewed and discussed with patient his Vitamin D was low, he does not recall the value, he was prescribed a prescription dose of Vitamin D  Reviewed medications with patient and discussed importance of medication adherence Discussed patient feels better overall and currently has no complaints or concerns related to his health         Please call the care guide team at [phone] if you need to schedule an appointment.   If you are experiencing a Mental Health or Behavioral Health Crisis or need someone to talk to, please call 1-800-273-TALK (toll free, 24 hour hotline)  Patient verbalizes understanding of instructions and care plan provided today and agrees to view in MyChart. Active MyChart status and patient understanding of how to access instructions and care plan via MyChart confirmed with patient.     Delsa Sale, RN, BSN, CCM Care Management Coordinator [facility] Care Management Direct Phone: [phone]

## 2023-03-08 ENCOUNTER — Telehealth: Payer: Self-pay

## 2023-03-08 ENCOUNTER — Other Ambulatory Visit (HOSPITAL_COMMUNITY): Payer: Self-pay

## 2023-03-08 NOTE — Telephone Encounter (Signed)
Pharmacy Patient Advocate Encounter   Received notification from CoverMyMeds that prior authorization for Icosapent Ethyl 1GM capsules is required/requested.   Insurance verification completed.   The patient is insured through [employer] .   Per test claim: PA started via CoverMyMeds. KEY [id number]    Status is pending

## 2023-03-13 DIAGNOSIS — H6693 Otitis media, unspecified, bilateral: Secondary | ICD-10-CM | POA: Diagnosis not present

## 2023-03-13 NOTE — Telephone Encounter (Signed)
Pharmacy Patient Advocate Encounter  Received notification from [facility] that Prior Authorization for {Icosapent Ethyl 1GM capsuleshas been  APPROVED THROUGH 7.19.25  PA #/Case ID/Reference #: [id number]

## 2023-03-23 DIAGNOSIS — G4733 Obstructive sleep apnea (adult) (pediatric): Secondary | ICD-10-CM | POA: Diagnosis not present

## 2023-03-25 DIAGNOSIS — G4733 Obstructive sleep apnea (adult) (pediatric): Secondary | ICD-10-CM | POA: Diagnosis not present

## 2023-03-28 DIAGNOSIS — Z111 Encounter for screening for respiratory tuberculosis: Secondary | ICD-10-CM | POA: Diagnosis not present

## 2023-04-19 DIAGNOSIS — E119 Type 2 diabetes mellitus without complications: Secondary | ICD-10-CM | POA: Diagnosis not present

## 2023-04-19 DIAGNOSIS — Z125 Encounter for screening for malignant neoplasm of prostate: Secondary | ICD-10-CM | POA: Diagnosis not present

## 2023-04-19 DIAGNOSIS — E785 Hyperlipidemia, unspecified: Secondary | ICD-10-CM | POA: Diagnosis not present

## 2023-04-19 DIAGNOSIS — R7989 Other specified abnormal findings of blood chemistry: Secondary | ICD-10-CM | POA: Diagnosis not present

## 2023-04-19 DIAGNOSIS — R82998 Other abnormal findings in urine: Secondary | ICD-10-CM | POA: Diagnosis not present

## 2023-04-19 DIAGNOSIS — R7301 Impaired fasting glucose: Secondary | ICD-10-CM | POA: Diagnosis not present

## 2023-04-23 DIAGNOSIS — G4733 Obstructive sleep apnea (adult) (pediatric): Secondary | ICD-10-CM | POA: Diagnosis not present

## 2023-04-25 DIAGNOSIS — G4733 Obstructive sleep apnea (adult) (pediatric): Secondary | ICD-10-CM | POA: Diagnosis not present

## 2023-04-29 DIAGNOSIS — G4733 Obstructive sleep apnea (adult) (pediatric): Secondary | ICD-10-CM | POA: Diagnosis not present

## 2023-04-30 ENCOUNTER — Other Ambulatory Visit: Payer: Self-pay | Admitting: Cardiovascular Disease

## 2023-04-30 DIAGNOSIS — E781 Pure hyperglyceridemia: Secondary | ICD-10-CM | POA: Diagnosis not present

## 2023-04-30 DIAGNOSIS — Z1331 Encounter for screening for depression: Secondary | ICD-10-CM | POA: Diagnosis not present

## 2023-04-30 DIAGNOSIS — Z23 Encounter for immunization: Secondary | ICD-10-CM | POA: Diagnosis not present

## 2023-04-30 DIAGNOSIS — Z0000 Encounter for general adult medical examination without abnormal findings: Secondary | ICD-10-CM | POA: Diagnosis not present

## 2023-05-01 ENCOUNTER — Other Ambulatory Visit: Payer: Self-pay | Admitting: Cardiovascular Disease

## 2023-05-06 DIAGNOSIS — H5213 Myopia, bilateral: Secondary | ICD-10-CM | POA: Diagnosis not present

## 2023-05-06 DIAGNOSIS — H52223 Regular astigmatism, bilateral: Secondary | ICD-10-CM | POA: Diagnosis not present

## 2023-05-23 DIAGNOSIS — G4733 Obstructive sleep apnea (adult) (pediatric): Secondary | ICD-10-CM | POA: Diagnosis not present

## 2023-06-05 DIAGNOSIS — A6920 Lyme disease, unspecified: Secondary | ICD-10-CM | POA: Diagnosis not present

## 2023-06-05 DIAGNOSIS — E119 Type 2 diabetes mellitus without complications: Secondary | ICD-10-CM | POA: Diagnosis not present

## 2023-06-05 DIAGNOSIS — R109 Unspecified abdominal pain: Secondary | ICD-10-CM | POA: Diagnosis not present

## 2023-06-07 ENCOUNTER — Other Ambulatory Visit: Payer: Self-pay

## 2023-06-07 ENCOUNTER — Encounter (HOSPITAL_COMMUNITY): Payer: Self-pay | Admitting: Emergency Medicine

## 2023-06-07 ENCOUNTER — Inpatient Hospital Stay (HOSPITAL_COMMUNITY)
Admission: EM | Admit: 2023-06-07 | Discharge: 2023-06-12 | DRG: 394 | Disposition: A | Discharge status: Home / Self Care | Payer: BC Managed Care – PPO | Attending: Internal Medicine | Admitting: Internal Medicine

## 2023-06-07 ENCOUNTER — Emergency Department (HOSPITAL_COMMUNITY): Payer: BC Managed Care – PPO

## 2023-06-07 DIAGNOSIS — N179 Acute kidney failure, unspecified: Secondary | ICD-10-CM | POA: Diagnosis not present

## 2023-06-07 DIAGNOSIS — N200 Calculus of kidney: Secondary | ICD-10-CM | POA: Diagnosis not present

## 2023-06-07 DIAGNOSIS — R000 Tachycardia, unspecified: Secondary | ICD-10-CM | POA: Diagnosis not present

## 2023-06-07 DIAGNOSIS — Z981 Arthrodesis status: Secondary | ICD-10-CM

## 2023-06-07 DIAGNOSIS — K7401 Hepatic fibrosis, early fibrosis: Secondary | ICD-10-CM | POA: Diagnosis not present

## 2023-06-07 DIAGNOSIS — K521 Toxic gastroenteritis and colitis: Principal | ICD-10-CM | POA: Diagnosis present

## 2023-06-07 DIAGNOSIS — Z7982 Long term (current) use of aspirin: Secondary | ICD-10-CM

## 2023-06-07 DIAGNOSIS — Z823 Family history of stroke: Secondary | ICD-10-CM

## 2023-06-07 DIAGNOSIS — K219 Gastro-esophageal reflux disease without esophagitis: Secondary | ICD-10-CM | POA: Diagnosis present

## 2023-06-07 DIAGNOSIS — E8729 Other acidosis: Secondary | ICD-10-CM

## 2023-06-07 DIAGNOSIS — R7401 Elevation of levels of liver transaminase levels: Secondary | ICD-10-CM

## 2023-06-07 DIAGNOSIS — Z8249 Family history of ischemic heart disease and other diseases of the circulatory system: Secondary | ICD-10-CM | POA: Diagnosis not present

## 2023-06-07 DIAGNOSIS — I1A0 Resistant hypertension: Secondary | ICD-10-CM | POA: Diagnosis present

## 2023-06-07 DIAGNOSIS — T383X5A Adverse effect of insulin and oral hypoglycemic [antidiabetic] drugs, initial encounter: Secondary | ICD-10-CM | POA: Diagnosis present

## 2023-06-07 DIAGNOSIS — R109 Unspecified abdominal pain: Secondary | ICD-10-CM | POA: Diagnosis not present

## 2023-06-07 DIAGNOSIS — R197 Diarrhea, unspecified: Secondary | ICD-10-CM | POA: Diagnosis not present

## 2023-06-07 DIAGNOSIS — Z955 Presence of coronary angioplasty implant and graft: Secondary | ICD-10-CM

## 2023-06-07 DIAGNOSIS — E872 Acidosis, unspecified: Secondary | ICD-10-CM | POA: Diagnosis present

## 2023-06-07 DIAGNOSIS — E781 Pure hyperglyceridemia: Secondary | ICD-10-CM | POA: Diagnosis present

## 2023-06-07 DIAGNOSIS — I2510 Atherosclerotic heart disease of native coronary artery without angina pectoris: Secondary | ICD-10-CM

## 2023-06-07 DIAGNOSIS — K760 Fatty (change of) liver, not elsewhere classified: Secondary | ICD-10-CM | POA: Diagnosis present

## 2023-06-07 DIAGNOSIS — I10 Essential (primary) hypertension: Secondary | ICD-10-CM | POA: Diagnosis not present

## 2023-06-07 DIAGNOSIS — Z83438 Family history of other disorder of lipoprotein metabolism and other lipidemia: Secondary | ICD-10-CM

## 2023-06-07 DIAGNOSIS — Z888 Allergy status to other drugs, medicaments and biological substances status: Secondary | ICD-10-CM | POA: Diagnosis not present

## 2023-06-07 DIAGNOSIS — R112 Nausea with vomiting, unspecified: Secondary | ICD-10-CM | POA: Diagnosis not present

## 2023-06-07 DIAGNOSIS — I252 Old myocardial infarction: Secondary | ICD-10-CM

## 2023-06-07 DIAGNOSIS — Z808 Family history of malignant neoplasm of other organs or systems: Secondary | ICD-10-CM | POA: Diagnosis not present

## 2023-06-07 DIAGNOSIS — D751 Secondary polycythemia: Secondary | ICD-10-CM | POA: Diagnosis not present

## 2023-06-07 DIAGNOSIS — E876 Hypokalemia: Secondary | ICD-10-CM | POA: Diagnosis not present

## 2023-06-07 DIAGNOSIS — Z880 Allergy status to penicillin: Secondary | ICD-10-CM | POA: Diagnosis not present

## 2023-06-07 DIAGNOSIS — Z833 Family history of diabetes mellitus: Secondary | ICD-10-CM

## 2023-06-07 DIAGNOSIS — E860 Dehydration: Secondary | ICD-10-CM | POA: Diagnosis not present

## 2023-06-07 DIAGNOSIS — R1084 Generalized abdominal pain: Secondary | ICD-10-CM | POA: Diagnosis not present

## 2023-06-07 DIAGNOSIS — Z79899 Other long term (current) drug therapy: Secondary | ICD-10-CM

## 2023-06-07 DIAGNOSIS — F429 Obsessive-compulsive disorder, unspecified: Secondary | ICD-10-CM | POA: Diagnosis present

## 2023-06-07 DIAGNOSIS — G4733 Obstructive sleep apnea (adult) (pediatric): Secondary | ICD-10-CM

## 2023-06-07 DIAGNOSIS — E785 Hyperlipidemia, unspecified: Secondary | ICD-10-CM | POA: Diagnosis not present

## 2023-06-07 DIAGNOSIS — Y92009 Unspecified place in unspecified non-institutional (private) residence as the place of occurrence of the external cause: Secondary | ICD-10-CM | POA: Diagnosis not present

## 2023-06-07 DIAGNOSIS — Z87442 Personal history of urinary calculi: Secondary | ICD-10-CM

## 2023-06-07 LAB — CBC WITH DIFFERENTIAL/PLATELET
Abs Immature Granulocytes: 0 10*3/uL (ref 0.00–0.07)
Basophils Absolute: 0 10*3/uL (ref 0.0–0.1)
Basophils Relative: 0 %
Eosinophils Absolute: 0.1 10*3/uL (ref 0.0–0.5)
Eosinophils Relative: 1 %
HCT: 58.6 % — ABNORMAL HIGH (ref 39.0–52.0)
Hemoglobin: 19.5 g/dL — ABNORMAL HIGH (ref 13.0–17.0)
Lymphocytes Relative: 21 %
Lymphs Abs: 1.4 10*3/uL (ref 0.7–4.0)
MCH: 26.7 pg (ref 26.0–34.0)
MCHC: 33.3 g/dL (ref 30.0–36.0)
MCV: 80.4 fL (ref 80.0–100.0)
Monocytes Absolute: 0.7 10*3/uL (ref 0.1–1.0)
Monocytes Relative: 11 %
Neutro Abs: 4.4 10*3/uL (ref 1.7–7.7)
Neutrophils Relative %: 67 %
Platelets: 332 10*3/uL (ref 150–400)
RBC: 7.29 MIL/uL — ABNORMAL HIGH (ref 4.22–5.81)
RDW: 15.3 % (ref 11.5–15.5)
WBC: 6.6 10*3/uL (ref 4.0–10.5)
nRBC: 0 % (ref 0.0–0.2)
nRBC: 0 /100{WBCs}

## 2023-06-07 LAB — COMPREHENSIVE METABOLIC PANEL
ALT: 125 U/L — ABNORMAL HIGH (ref 0–44)
AST: 82 U/L — ABNORMAL HIGH (ref 15–41)
Albumin: 4.1 g/dL (ref 3.5–5.0)
Alkaline Phosphatase: 93 U/L (ref 38–126)
Anion gap: 16 — ABNORMAL HIGH (ref 5–15)
BUN: 22 mg/dL — ABNORMAL HIGH (ref 6–20)
CO2: 15 mmol/L — ABNORMAL LOW (ref 22–32)
Calcium: 9.9 mg/dL (ref 8.9–10.3)
Chloride: 102 mmol/L (ref 98–111)
Creatinine, Ser: 1.41 mg/dL — ABNORMAL HIGH (ref 0.61–1.24)
GFR, Estimated: >60 mL/min (ref >60)
Glucose, Bld: 220 mg/dL — ABNORMAL HIGH (ref 70–99)
Potassium: 4.3 mmol/L (ref 3.5–5.1)
Sodium: 133 mmol/L — ABNORMAL LOW (ref 135–145)
Total Bilirubin: 1.2 mg/dL (ref 0.3–1.2)
Total Protein: 7.6 g/dL (ref 6.5–8.1)

## 2023-06-07 LAB — URINALYSIS, ROUTINE W REFLEX MICROSCOPIC
Glucose, UA: 50 mg/dL — AB
Hgb urine dipstick: NEGATIVE
Ketones, ur: 5 mg/dL — AB
Leukocytes,Ua: NEGATIVE
Nitrite: NEGATIVE
Protein, ur: 100 mg/dL — AB
Specific Gravity, Urine: 1.032 — ABNORMAL HIGH (ref 1.005–1.030)
pH: 5 (ref 5.0–8.0)

## 2023-06-07 LAB — C DIFFICILE QUICK SCREEN W PCR REFLEX
C Diff antigen: NEGATIVE
C Diff interpretation: NOT DETECTED
C Diff toxin: NEGATIVE

## 2023-06-07 LAB — SEDIMENTATION RATE: Sed Rate: 3 mm/h (ref 0–16)

## 2023-06-07 LAB — LIPASE, BLOOD: Lipase: 22 U/L (ref 11–51)

## 2023-06-07 LAB — C-REACTIVE PROTEIN: CRP: 4.4 mg/dL — ABNORMAL HIGH (ref <1.0)

## 2023-06-07 LAB — TSH: TSH: 4.159 u[IU]/mL (ref 0.350–4.500)

## 2023-06-07 MED ORDER — SODIUM CHLORIDE 0.9 % IV SOLN: INTRAVENOUS | Status: AC

## 2023-06-07 MED ORDER — SODIUM CHLORIDE 0.9 % IV BOLUS
1000.0000 mL | Freq: Once | INTRAVENOUS | Status: AC
  Administered 2023-06-07: 1000 mL via INTRAVENOUS

## 2023-06-07 MED ORDER — AMLODIPINE BESYLATE 10 MG PO TABS
10.0000 mg | ORAL_TABLET | Freq: Every evening | ORAL | Status: DC
  Administered 2023-06-07 – 2023-06-11 (×5): 10 mg via ORAL
  Filled 2023-06-07 (×5): qty 1

## 2023-06-07 MED ORDER — ICOSAPENT ETHYL 1 G PO CAPS
2.0000 g | ORAL_CAPSULE | Freq: Two times a day (BID) | ORAL | Status: DC
  Administered 2023-06-07 – 2023-06-12 (×11): 2 g via ORAL
  Filled 2023-06-07 (×11): qty 2

## 2023-06-07 MED ORDER — IOHEXOL 350 MG/ML SOLN
75.0000 mL | Freq: Once | INTRAVENOUS | Status: AC | PRN
  Administered 2023-06-07: 75 mL via INTRAVENOUS

## 2023-06-07 MED ORDER — SODIUM CHLORIDE 0.9 % IV SOLN: INTRAVENOUS | Status: DC

## 2023-06-07 MED ORDER — MORPHINE SULFATE (PF) 2 MG/ML IV SOLN
2.0000 mg | INTRAVENOUS | Status: DC | PRN
  Administered 2023-06-07 – 2023-06-10 (×3): 2 mg via INTRAVENOUS
  Filled 2023-06-07 (×5): qty 1

## 2023-06-07 MED ORDER — LORAZEPAM 2 MG/ML IJ SOLN
0.5000 mg | Freq: Once | INTRAMUSCULAR | Status: AC
  Administered 2023-06-07: 0.5 mg via INTRAVENOUS
  Filled 2023-06-07: qty 1

## 2023-06-07 MED ORDER — ACETAMINOPHEN 650 MG RE SUPP: 650.0000 mg | Freq: Four times a day (QID) | RECTAL | Status: DC | PRN

## 2023-06-07 MED ORDER — METOPROLOL SUCCINATE ER 50 MG PO TB24
50.0000 mg | ORAL_TABLET | Freq: Every day | ORAL | Status: DC
  Administered 2023-06-07 – 2023-06-12 (×6): 50 mg via ORAL
  Filled 2023-06-07 (×7): qty 1

## 2023-06-07 MED ORDER — ONDANSETRON HCL 4 MG/2ML IJ SOLN: 4.0000 mg | Freq: Four times a day (QID) | INTRAMUSCULAR | Status: DC | PRN

## 2023-06-07 MED ORDER — ROSUVASTATIN CALCIUM 20 MG PO TABS
20.0000 mg | ORAL_TABLET | Freq: Every evening | ORAL | Status: DC
  Administered 2023-06-07 – 2023-06-11 (×5): 20 mg via ORAL
  Filled 2023-06-07 (×5): qty 1

## 2023-06-07 MED ORDER — ONDANSETRON HCL 4 MG/2ML IJ SOLN
4.0000 mg | Freq: Once | INTRAMUSCULAR | Status: AC
  Administered 2023-06-07: 4 mg via INTRAVENOUS
  Filled 2023-06-07: qty 2

## 2023-06-07 MED ORDER — MORPHINE SULFATE (PF) 4 MG/ML IV SOLN
4.0000 mg | Freq: Once | INTRAVENOUS | Status: AC
  Administered 2023-06-07: 4 mg via INTRAVENOUS
  Filled 2023-06-07: qty 1

## 2023-06-07 MED ORDER — METOPROLOL TARTRATE 5 MG/5ML IV SOLN: 5.0000 mg | Freq: Three times a day (TID) | INTRAVENOUS | Status: DC | PRN

## 2023-06-07 MED ORDER — ASPIRIN 81 MG PO TBEC
81.0000 mg | DELAYED_RELEASE_TABLET | Freq: Every evening | ORAL | Status: DC
  Administered 2023-06-07 – 2023-06-11 (×5): 81 mg via ORAL
  Filled 2023-06-07 (×5): qty 1

## 2023-06-07 MED ORDER — ONDANSETRON HCL 4 MG PO TABS: 4.0000 mg | ORAL_TABLET | Freq: Four times a day (QID) | ORAL | Status: DC | PRN

## 2023-06-07 MED ORDER — ENOXAPARIN SODIUM 40 MG/0.4ML IJ SOSY
40.0000 mg | PREFILLED_SYRINGE | INTRAMUSCULAR | Status: DC
  Administered 2023-06-07 – 2023-06-11 (×5): 40 mg via SUBCUTANEOUS
  Filled 2023-06-07 (×5): qty 0.4

## 2023-06-07 MED ORDER — ACETAMINOPHEN 325 MG PO TABS
650.0000 mg | ORAL_TABLET | Freq: Four times a day (QID) | ORAL | Status: DC | PRN
  Administered 2023-06-08 – 2023-06-09 (×2): 650 mg via ORAL
  Filled 2023-06-07 (×2): qty 2

## 2023-06-07 NOTE — Assessment & Plan Note (Addendum)
47 year old presenting with 3 day history of abdominal pain, vomiting and diarrhea with poor PO intake found to have dehydration and an acute kidney injury.  -obs to tele  -creatinine 0.7 in August 2024, presenting with creatinine of 1.41, ketones in urine and metabolic acidosis with AG  -likely pre renal in setting of exogenous losses -received 2L IVF in ED, continue with maintenance IVF for 12 hours and encourage oral intake  -UA negative for hgb + ketones  -strict I/O -hold nephrotoxic drugs -trend

## 2023-06-07 NOTE — Plan of Care (Signed)
CHL Tonsillectomy/Adenoidectomy, Postoperative PEDS care plan entered in error.

## 2023-06-07 NOTE — Assessment & Plan Note (Addendum)
He has fatty liver and states his liver enzymes are typically a little elevated  He is at baseline when compared to labs from 03/2023  Encouraged weight loss/dietary changes

## 2023-06-07 NOTE — Assessment & Plan Note (Signed)
Continue crestor 20 mg 

## 2023-06-07 NOTE — Assessment & Plan Note (Signed)
Continue cpap at night  

## 2023-06-07 NOTE — Assessment & Plan Note (Signed)
Continue novasc 10mg , toprol-xl 50mg  daily  Hold valsartan with AKI  PRN lopressor

## 2023-06-07 NOTE — ED Triage Notes (Addendum)
Pt having ABD pain. Vomiting. He states stomach feels full and bloated. He has had this before and pancreatic enzymes elevated. Been taking monjaro a month ago with no recent dose changes.  This occurred in January when he was taking ozempic. Having diarrhea as well.

## 2023-06-07 NOTE — Assessment & Plan Note (Signed)
Hemoconcentrated in setting of dehydration  Hgb normal on last lab draw (15.8)  Continue IVG and trend

## 2023-06-07 NOTE — Assessment & Plan Note (Signed)
Diarrhea x 3 days Similar presentation in January and both times he had started a GLP-1 drug No pancreatitis/cholecystitis or diverticulitis on imaging/labs.  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

## 2023-06-07 NOTE — Assessment & Plan Note (Signed)
Secondary to AKI  No SIRS criteria, only tachycardia  Continue IVF and except to resolve

## 2023-06-07 NOTE — ED Notes (Signed)
Pt is crying and stating he is really scared. Pulse elevated. RN working to reassure him.

## 2023-06-07 NOTE — H&P (Signed)
History and Physical    Patient: Richard Hull WJX:[id number] DOB: 1975-09-22 DOA: 06/07/2023 DOS: the patient was seen and examined on 06/07/2023 PCP: [name], MD  Patient coming from: Home - lives with his wife and 3 kids.    Chief Complaint: N/V/D.   HPI: Richard Hull is a 47 y.o. male with medical history significant of  hypertension, CAD, dyslipidemia, OCD, OSA and nephrolithiasis who presented to ED with complaints of 3-4 days of nausea/vomiting, diarrhea and abdominal pain with poor PO intake. Wednesday morning he woke up with violent vomiting, diarrhea and foul smelling burps. He had cramping in his stomach. Saw his PCP on Wednesday and was told his pancreas enzymes were high. Thursday he had no vomiting until late last night. He states the pressure in his abdomen was pressure and painful. Only relief ws to burp or vomit. He has had diarrhea since Wednesday morning. His abdominal pain is started to come back. The medication does help some. He denies any fever/chills or blood in his stool. He has well water.   He can correlate starting ozempic back in January when he had simliar episode and now he has been on monjouro for one month.   He had his colonoscopy back in December of 2023 and was wnl.   Admitted in January of 2024 for similar symptoms thought to be due to viral gastroenteritis.   He does not smoke or drink alcohol.   ER Course:  vitals: afebrile, bp: 153/112, HR: 147, RR: 18, oxygen: 99%RA Pertinent labs: hgb: 19.5, co2: 15, BUN: 22, creatinine: 1.41, AST: 82 ALT: 125, AG: 16,  CT abdomen/pelvis: hepatic steatosis. Nonobstructive left nephrolithiasis In OZ:HYQMV 2L IVF boluses, zofran and morphine. Stool studies ordered. TRH asked to admit.   Review of Systems: As mentioned in the history of present illness. All other systems reviewed and are negative. Past Medical History:  Diagnosis Date   Cervical radiculopathy at C5 01/27/2016   left   Coronary artery  disease 08/2015   Depression    GERD (gastroesophageal reflux disease)    History of kidney stones    Hyperlipidemia LDL goal <70    Hypertension    Hypertriglyceridemia    Left arm weakness    Myocardial infarction (HCC) 08/2015   OCD (obsessive compulsive disorder)    Sleep apnea    Past Surgical History:  Procedure Laterality Date   ANTERIOR CERVICAL DECOMP/DISCECTOMY FUSION N/A 05/01/2017   Procedure: Anterior Cervical Decompression Fusion Cervical Four-Five Cervical Five-Six;  Surgeon: Shirlean Kelly, MD;  Location: [facility] OR;  Service: Neurosurgery;  Laterality: N/A;  Anterior Cervical Decompression Fusion Cervical Four-Five Cervical Five-Six   CARDIAC CATHETERIZATION N/A 09/15/2015   Procedure: Left Heart Cath and Coronary Angiography;  Surgeon: Lennette Bihari, MD; LAD 30%, pRCA 30%, dRCA 30%, RPDA 99%, nl LV function    CARDIAC CATHETERIZATION N/A 09/15/2015   Procedure: Coronary Stent Intervention;  Surgeon: Lennette Bihari, MD; 3.515 mm Xience Alpine DES to dRCA/RPDA>> 5% residual      LEFT HEART CATH AND CORONARY ANGIOGRAPHY N/A 11/28/2016   Procedure: Left Heart Cath and Coronary Angiography;  Surgeon: Lennette Bihari, MD;  Location: [facility];  Service: Cardiovascular;  Laterality: N/A;   TONSILLECTOMY     Social History:  reports that he has never smoked. He has never used smokeless tobacco. He reports current alcohol use. He reports that he does not use drugs.  Allergies  Allergen Reactions   Benicar [Olmesartan] Other (See Comments)  Metallic taste   Coreg [Carvedilol] Other (See Comments)    Severe fatigue   Amoxil [Amoxicillin] Rash   Penicillins Rash and Other (See Comments)    Has patient had a PCN reaction causing immediate rash, facial/tongue/throat swelling, SOB or lightheadedness with hypotension: Yes Has patient had a PCN reaction causing severe rash involving mucus membranes or skin necrosis: No Has patient had a PCN reaction that required  hospitalization No Has patient had a PCN reaction occurring within the last 10 years: No If all of the above answers are "NO", then may proceed with Cephalosporin use.     Family History  Problem Relation Age of Onset   Heart attack Father 56       approx age of first stent   Stroke Father    Hypertension Father    Skin cancer Mother    Hyperlipidemia Sister    Stroke Paternal Grandmother    Hypertension Paternal Grandmother    Hypertension Maternal Grandmother    Hypertension Maternal Grandfather    Diabetes Maternal Grandfather    Hypertension Paternal Grandfather    Diabetes Paternal Grandfather    Heart disease Paternal Grandfather     Prior to Admission medications   Medication Sig Start Date End Date Taking? Authorizing Provider  amLODipine (NORVASC) 10 MG tablet Take 1 tablet by mouth in the morning.    [provider]  aspirin EC 81 MG EC tablet Take 1 tablet (81 mg total) by mouth daily. Patient taking differently: Take 81 mg by mouth every evening. 09/18/15   Dwana Melena, PA-C  cholestyramine light (PREVALITE) 4 g packet Take 1 packet (4 g total) by mouth 2 (two) times daily. Slowly wean off once diarrhea improves 09/10/22 11/12/22  [place], Alvira Philips, DO  dicyclomine (BENTYL) 20 MG tablet Take 1 tablet (20 mg total) by mouth 3 (three) times daily as needed for spasms (Abdominal cramping/spasms). 09/10/22 11/12/22  [place], Alvira Philips, DO  icosapent Ethyl (VASCEPA) 1 g capsule TAKE 2 CAPSULES BY MOUTH 2 TIMES DAILY. 05/01/23   Lennette Bihari, MD  loperamide (IMODIUM) 2 MG capsule Take 1 capsule (2 mg total) by mouth as needed for diarrhea or loose stools. 09/10/22   [place], Alvira Philips, DO  metoprolol succinate (TOPROL-XL) 50 MG 24 hr tablet TAKE 1 TABLET BY MOUTH DAILY. TAKE WITH OR IMMEDIATELY FOLLOWING A MEAL. 12/31/22   Lennette Bihari, MD  nitroGLYCERIN (NITROSTAT) 0.4 MG SL tablet Place 1 tablet (0.4 mg total) under the tongue every 5 (five) minutes as needed for chest  pain (CP or SOB). 07/27/20   Lennette Bihari, MD  Probiotic Product (PROBIOTIC PO) Take 1 capsule by mouth daily as needed (stomach discomfort).    [provider]  rosuvastatin (CRESTOR) 20 MG tablet TAKE 1 TABLET BY MOUTH EVERY DAY 04/30/23   Lennette Bihari, MD  valsartan (DIOVAN) 160 MG tablet TAKE 1 TABLET BY MOUTH EVERY DAY 04/30/23   Lennette Bihari, MD    Physical Exam: Vitals:   06/07/23 0931 06/07/23 1024 06/07/23 1115 06/07/23 1130  BP: (!) 158/85 (!) 154/106 (!) 147/98 (!) 143/106  Pulse: (!) 121 (!) 116 (!) 113 (!) 114  Resp: (!) 22 (!) 22 (!) 22 (!) 22  Temp: 98.1 F (36.7 C)     TempSrc: Oral     SpO2: 99% 100% 99% 100%  Weight:      Height:       General:  Appears calm and comfortable and is in  NAD Eyes:  PERRL, EOMI, normal lids, iris ENT:  grossly normal hearing, lips & tongue, mmm; appropriate dentition Neck:  no LAD, masses or thyromegaly; no carotid bruits Cardiovascular:  RRR, no m/r/g. No LE edema.  Respiratory:   CTA bilaterally with no wheezes/rales/rhonchi.  Normal respiratory effort. Abdomen:  soft,TTP in left and right lower quadrants, hyperactive BS.  Back:   normal alignment, no CVAT Skin:  no rash or induration seen on limited exam Musculoskeletal:  grossly normal tone BUE/BLE, good ROM, no bony abnormality Lower extremity:  No LE edema.  Limited foot exam with no ulcerations.  2+ distal pulses. Psychiatric:  grossly normal mood and affect, speech fluent and appropriate, AOx3 Neurologic:  CN 2-12 grossly intact, moves all extremities in coordinated fashion, sensation intact   Radiological Exams on Admission: Independently reviewed - see discussion in A/P where applicable  CT ABDOMEN PELVIS W CONTRAST  Result Date: 06/07/2023 CLINICAL DATA:  Acute generalized abdominal pain. EXAM: CT ABDOMEN AND PELVIS WITH CONTRAST TECHNIQUE: Multidetector CT imaging of the abdomen and pelvis was performed using the standard protocol following bolus  administration of intravenous contrast. RADIATION DOSE REDUCTION: This exam was performed according to the departmental dose-optimization program which includes automated exposure control, adjustment of the mA and/or kV according to patient size and/or use of iterative reconstruction technique. CONTRAST:  75mL OMNIPAQUE IOHEXOL 350 MG/ML SOLN COMPARISON:  September 03, 2022. FINDINGS: Lower chest: No acute abnormality. Hepatobiliary: Hepatic steatosis. No cholelithiasis or biliary dilatation. Pancreas: Unremarkable. No pancreatic ductal dilatation or surrounding inflammatory changes. Spleen: Normal in size without focal abnormality. Adrenals/Urinary Tract: Adrenal glands appear normal. Small nonobstructive left renal calculus. No hydronephrosis or renal obstruction is noted. Urinary bladder is unremarkable. Stomach/Bowel: Stomach is within normal limits. Appendix appears normal. No evidence of bowel wall thickening, distention, or inflammatory changes. Vascular/Lymphatic: No significant vascular findings are present. No enlarged abdominal or pelvic lymph nodes. Reproductive: Prostate is unremarkable. Other: No abdominal wall hernia or abnormality. No abdominopelvic ascites. Musculoskeletal: No acute or significant osseous findings. IMPRESSION: Hepatic steatosis. Nonobstructive left nephrolithiasis. Electronically Signed   By: Lupita Raider M.D.   On: 06/07/2023 09:52    EKG: Independently reviewed.  Sinus tachycardia with rate 141; nonspecific ST changes with no evidence of acute ischemia   Labs on Admission: I have personally reviewed the available labs and imaging studies at the time of the admission.  Pertinent labs:   hgb: 19.5,  co2: 15,  BUN: 22,  creatinine: 1.41,  AST: 82  ALT: 125,  AG: 16,   Assessment and Plan: Principal Problem:   Acute kidney injury secondary to dehydration from N/V/D. Active Problems:   Dehydration   Diarrhea   Metabolic acidosis, increased anion gap    Transaminitis   Polycythemia   CAD (coronary artery disease)   HTN (hypertension)   Hyperlipidemia LDL goal <70   OSA on CPAP    Assessment and Plan: * Acute kidney injury secondary to dehydration from N/V/D. 47 year old presenting with 3 day history of abdominal pain, vomiting and diarrhea with poor PO intake found to have dehydration and an acute kidney injury.  -obs to tele  -creatinine 0.7 in August 2024, presenting with creatinine of 1.41, ketones in urine and metabolic acidosis with AG  -likely pre renal in setting of exogenous losses -received 2L IVF in ED, continue with maintenance IVF for 12 hours and encourage oral intake  -UA negative for hgb + ketones  -strict I/O -hold nephrotoxic drugs -trend  Diarrhea Diarrhea x 3 days Similar presentation in January and both times he had started a GLP-1 drug No pancreatitis/cholecystitis or diverticulitis on imaging/labs.  Colonoscopy in 12/23 wnl  Stool studies pending/c.diff, TSH/inflammatory  markers and celiac panel, but most likely GLP-1 induced  Continue enteric precautions   Metabolic acidosis, increased anion gap Secondary to AKI  No SIRS criteria, only tachycardia  Continue IVF and except to resolve   Transaminitis He has fatty liver and states his liver enzymes are typically a little elevated  He is at baseline when compared to labs from 03/2023  Encouraged weight loss/dietary changes   Polycythemia Hemoconcentrated in setting of dehydration  Hgb normal on last lab draw (15.8)  Continue IVG and trend   CAD (coronary artery disease) Followed by cardiology for MI with stenting in RCA in 2017 Continue medical therapy   HTN (hypertension) Continue novasc 10mg , toprol-xl 50mg  daily  Hold valsartan with AKI  PRN lopressor   Hyperlipidemia LDL goal <70 Continue crestor 20mg    OSA on CPAP Continue cpap at night     Advance Care Planning:   Code Status: Full Code   Consults: none   DVT Prophylaxis:  lovenox   Family Communication: wife at bedside   Severity of Illness: The appropriate patient status for this patient is OBSERVATION. Observation status is judged to be reasonable and necessary in order to provide the required intensity of service to ensure the patient's safety. The patient's presenting symptoms, physical exam findings, and initial radiographic and laboratory data in the context of their medical condition is felt to place them at decreased risk for further clinical deterioration. Furthermore, it is anticipated that the patient will be medically stable for discharge from the hospital within 2 midnights of admission.   Author: Orland Mustard, MD 06/07/2023 1:01 PM  For on call review [email or web address].

## 2023-06-07 NOTE — ED Provider Notes (Signed)
[place] EMERGENCY DEPARTMENT AT [facility] Provider Note   CSN: [id number] Arrival date & time: 06/07/23  0102     History  Chief Complaint  Patient presents with   Abdominal Pain    Richard Hull is a 47 y.o. male.  47 year old male with prior medical history as below presents for evaluation.  Patient reports that over the last 3 to 4 days he developed diffuse abdominal bloating and pain.  This was associate with nausea, vomiting, and diarrhea.  He denies fevers.  He reports similar episode in January of this past year which required admission.  He is unsure of the underlying diagnosis at that time.  He currently is taking Mounjaro for the last month.  He denies any symptoms prior to this past week.  He reports that he may have had some transient elevation of his pancreatic enzymes previously.  He reports that he is never been diagnosed with pancreatitis.  The history is provided by the patient and medical records.       Home Medications Prior to Admission medications   Medication Sig Start Date End Date Taking? Authorizing Provider  amLODipine (NORVASC) 10 MG tablet Take 1 tablet by mouth in the morning.    [provider]  aspirin EC 81 MG EC tablet Take 1 tablet (81 mg total) by mouth daily. Patient taking differently: Take 81 mg by mouth every evening. 09/18/15   Dwana Melena, PA-C  cholestyramine light (PREVALITE) 4 g packet Take 1 packet (4 g total) by mouth 2 (two) times daily. Slowly wean off once diarrhea improves 09/10/22 11/12/22  [place], Alvira Philips, DO  dicyclomine (BENTYL) 20 MG tablet Take 1 tablet (20 mg total) by mouth 3 (three) times daily as needed for spasms (Abdominal cramping/spasms). 09/10/22 11/12/22  [place], Alvira Philips, DO  icosapent Ethyl (VASCEPA) 1 g capsule TAKE 2 CAPSULES BY MOUTH 2 TIMES DAILY. 05/01/23   Lennette Bihari, MD  loperamide (IMODIUM) 2 MG capsule Take 1 capsule (2 mg total) by mouth as needed for diarrhea or loose  stools. 09/10/22   [place], Alvira Philips, DO  metoprolol succinate (TOPROL-XL) 50 MG 24 hr tablet TAKE 1 TABLET BY MOUTH DAILY. TAKE WITH OR IMMEDIATELY FOLLOWING A MEAL. 12/31/22   Lennette Bihari, MD  nitroGLYCERIN (NITROSTAT) 0.4 MG SL tablet Place 1 tablet (0.4 mg total) under the tongue every 5 (five) minutes as needed for chest pain (CP or SOB). 07/27/20   Lennette Bihari, MD  Probiotic Product (PROBIOTIC PO) Take 1 capsule by mouth daily as needed (stomach discomfort).    [provider]  rosuvastatin (CRESTOR) 20 MG tablet TAKE 1 TABLET BY MOUTH EVERY DAY 04/30/23   Lennette Bihari, MD  valsartan (DIOVAN) 160 MG tablet TAKE 1 TABLET BY MOUTH EVERY DAY 04/30/23   Lennette Bihari, MD      Allergies    Benicar [olmesartan], Amoxil [amoxicillin], and Penicillins    Review of Systems   Review of Systems  All other systems reviewed and are negative.   Physical Exam Updated Vital Signs BP 137/88   Pulse (!) 132   Temp 98.5 F (36.9 C)   Resp 20   Ht 5\' 9"  (1.753 m)   Wt 113.4 kg   SpO2 97%   BMI 36.92 kg/m  Physical Exam Vitals and nursing note reviewed.  Constitutional:      General: He is not in acute distress.    Appearance: Normal appearance. He is well-developed.  HENT:     Head: Normocephalic and atraumatic.  Eyes:     Conjunctiva/sclera: Conjunctivae normal.     Pupils: Pupils are equal, round, and reactive to light.  Cardiovascular:     Rate and Rhythm: Normal rate and regular rhythm.     Heart sounds: Normal heart sounds.  Pulmonary:     Effort: Pulmonary effort is normal. No respiratory distress.     Breath sounds: Normal breath sounds.  Abdominal:     General: There is no distension.     Palpations: Abdomen is soft.     Tenderness: There is generalized abdominal tenderness.  Musculoskeletal:        General: No deformity. Normal range of motion.     Cervical back: Normal range of motion and neck supple.  Skin:    General: Skin is warm and dry.   Neurological:     General: No focal deficit present.     Mental Status: He is alert and oriented to person, place, and time.     ED Results / Procedures / Treatments   Labs (all labs ordered are listed, but only abnormal results are displayed) Labs Reviewed  COMPREHENSIVE METABOLIC PANEL - Abnormal; Notable for the following components:      Result Value   Sodium 133 (*)    CO2 15 (*)    Glucose, Bld 220 (*)    BUN 22 (*)    Creatinine, Ser 1.41 (*)    AST 82 (*)    ALT 125 (*)    Anion gap 16 (*)    All other components within normal limits  CBC WITH DIFFERENTIAL/PLATELET - Abnormal; Notable for the following components:   RBC 7.29 (*)    Hemoglobin 19.5 (*)    HCT 58.6 (*)    All other components within normal limits  URINALYSIS, ROUTINE W REFLEX MICROSCOPIC - Abnormal; Notable for the following components:   Color, Urine AMBER (*)    APPearance HAZY (*)    Specific Gravity, Urine 1.032 (*)    Glucose, UA 50 (*)    Bilirubin Urine SMALL (*)    Ketones, ur 5 (*)    Protein, ur 100 (*)    Bacteria, UA RARE (*)    All other components within normal limits  C DIFFICILE QUICK SCREEN W PCR REFLEX    GASTROINTESTINAL PANEL BY PCR, STOOL (REPLACES STOOL CULTURE)  LIPASE, BLOOD    EKG EKG Interpretation Date/Time:  Friday June 07 2023 05:43:34 EDT Ventricular Rate:  141 PR Interval:  128 QRS Duration:  98 QT Interval:  290 QTC Calculation: 444 R Axis:   115  Text Interpretation: Sinus tachycardia Left posterior fascicular block Abnormal ECG When compared with ECG of 03-Sep-2022 22:43, PREVIOUS ECG IS PRESENT Confirmed by Kristine Royal [phone]) on 06/07/2023 7:25:16 AM  Radiology No results found.  Procedures Procedures    Medications Ordered in ED Medications  sodium chloride 0.9 % bolus 1,000 mL (1,000 mLs Intravenous New Bag/Given 06/07/23 0740)  LORazepam (ATIVAN) injection 0.5 mg (0.5 mg Intravenous Given 06/07/23 0740)  ondansetron (ZOFRAN) injection  4 mg (4 mg Intravenous Given 06/07/23 0741)    ED Course/ Medical Decision Making/ A&P                                 Medical Decision Making Amount and/or Complexity of Data Reviewed Labs: ordered. Radiology: ordered.  Risk Prescription drug management. Decision regarding hospitalization.  Medical Screen Complete  This patient presented to the ED with complaint of abdominal pain, nausea, vomiting, diarrhea.  This complaint involves an extensive number of treatment options. The initial differential diagnosis includes, but is not limited to, metabolic abnormality, AKI, intra-abdominal infection, etc.  This presentation is: Acute, Self-Limited, Previously Undiagnosed, Uncertain Prognosis, Complicated, Systemic Symptoms, and Threat to Life/Bodily Function  Patient is reporting significant nausea, vomiting, diarrhea with diffuse abdominal pain x 3 to 4 days.  Patient reports decreased p.o. intake secondary to same symptoms.  Patient is tachycardic with significant diffuse abdominal tenderness.  With IV fluids, antiemetics, and pain medication the patient feels improved.  Heart rate is gradually improving.  Labs suggest some degree of AKI.  CT is without significant acute abnormality.  Patient would likely benefit from overnight admission for IV fluids / rehydration, nausea and pain control.  Hospital service made aware of case.  Additional history obtained: External records from outside sources obtained and reviewed including prior ED visits and prior Inpatient records.    Lab Tests:  I ordered and personally interpreted labs.  The pertinent results include: CBC, CMP, lipase, UA   Imaging Studies ordered:  I ordered imaging studies including CT abdomen pelvis I independently visualized and interpreted obtained imaging which showed NAD I agree with the radiologist interpretation.   Cardiac Monitoring:  The patient was maintained on a cardiac monitor.  I  personally viewed and interpreted the cardiac monitor which showed an underlying rhythm of: Sinus tach   Medicines ordered:  I ordered medication including IV fluids, morphine, Zofran, Ativan for dehydration, pain, nausea Reevaluation of the patient after these medicines showed that the patient: improved  Problem List / ED Course:  Nausea, vomiting, diarrhea, AKI   Reevaluation:  After the interventions noted above, I reevaluated the patient and found that they have: improved   Disposition:  After consideration of the diagnostic results and the patients response to treatment, I feel that the patent would benefit from admission.          Final Clinical Impression(s) / ED Diagnoses Final diagnoses:  Generalized abdominal pain  Nausea vomiting and diarrhea    Rx / DC Orders ED Discharge Orders     None         Wynetta Fines, MD 06/07/23 1146

## 2023-06-07 NOTE — ED Notes (Signed)
ED TO INPATIENT HANDOFF REPORT  ED Nurse Name and Phone #: Einar Grad [phone]  S Name/Age/Gender Richard Hull 47 y.o. male Room/Bed: 028C/028C  Code Status   Code Status: Prior  Home/SNF/Other Home Patient oriented to: self, place, time, and situation Is this baseline? Yes   Triage Complete: Triage complete  Chief Complaint Acute kidney injury [facility]) [N17.9]  Triage Note Pt having ABD pain. Vomiting. He states stomach feels full and bloated. He has had this before and pancreatic enzymes elevated. Been taking monjaro a month ago with no recent dose changes.  This occurred in January when he was taking ozempic. Having diarrhea as well.    Allergies Allergies  Allergen Reactions   Benicar [Olmesartan] Other (See Comments)    Metallic taste   Amoxil [Amoxicillin] Rash   Penicillins Rash and Other (See Comments)    Has patient had a PCN reaction causing immediate rash, facial/tongue/throat swelling, SOB or lightheadedness with hypotension: Yes Has patient had a PCN reaction causing severe rash involving mucus membranes or skin necrosis: No Has patient had a PCN reaction that required hospitalization No Has patient had a PCN reaction occurring within the last 10 years: No If all of the above answers are "NO", then may proceed with Cephalosporin use.     Level of Care/Admitting Diagnosis ED Disposition     ED Disposition  Admit   Condition  --   Comment  Hospital Area: [facility] [[id number]]  Level of Care: Telemetry Medical [104]  May place patient in observation at [facility] or [facility] if equivalent level of care is available:: Yes  Covid Evaluation: Asymptomatic - no recent exposure (last 10 days) testing not required  Diagnosis: Acute kidney injury [facility]) [[id number]]  Admitting Physician: Orland Mustard [[id number]]  Attending Physician: Alton Revere          B Medical/Surgery History Past Medical History:  Diagnosis Date    Cervical radiculopathy at C5 01/27/2016   left   Coronary artery disease 08/2015   Depression    GERD (gastroesophageal reflux disease)    History of kidney stones    Hyperlipidemia LDL goal <70    Hypertension    Hypertriglyceridemia    Left arm weakness    Myocardial infarction (HCC) 08/2015   OCD (obsessive compulsive disorder)    Sleep apnea    Past Surgical History:  Procedure Laterality Date   ANTERIOR CERVICAL DECOMP/DISCECTOMY FUSION N/A 05/01/2017   Procedure: Anterior Cervical Decompression Fusion Cervical Four-Five Cervical Five-Six;  Surgeon: Shirlean Kelly, MD;  Location: [facility] OR;  Service: Neurosurgery;  Laterality: N/A;  Anterior Cervical Decompression Fusion Cervical Four-Five Cervical Five-Six   CARDIAC CATHETERIZATION N/A 09/15/2015   Procedure: Left Heart Cath and Coronary Angiography;  Surgeon: Lennette Bihari, MD; LAD 30%, pRCA 30%, dRCA 30%, RPDA 99%, nl LV function    CARDIAC CATHETERIZATION N/A 09/15/2015   Procedure: Coronary Stent Intervention;  Surgeon: Lennette Bihari, MD; 3.515 mm Xience Alpine DES to dRCA/RPDA>> 5% residual      LEFT HEART CATH AND CORONARY ANGIOGRAPHY N/A 11/28/2016   Procedure: Left Heart Cath and Coronary Angiography;  Surgeon: Lennette Bihari, MD;  Location: [facility] INVASIVE CV LAB;  Service: Cardiovascular;  Laterality: N/A;   TONSILLECTOMY       A IV Location/Drains/Wounds Patient Lines/Drains/Airways Status     Active Line/Drains/Airways     Name Placement date Placement time Site Days   Peripheral IV 06/07/23 20 G Left;Posterior Hand 06/07/23  0730  Hand  less than 1            Intake/Output Last 24 hours  Intake/Output Summary (Last 24 hours) at 06/07/2023 1149 Last data filed at 06/07/2023 1044 Gross per 24 hour  Intake 2000 ml  Output --  Net 2000 ml    Labs/Imaging Results for orders placed or performed during the hospital encounter of 06/07/23 (from the past 48 hour(s))  Comprehensive metabolic panel     Status:  Abnormal   Collection Time: 06/07/23  5:43 AM  Result Value Ref Range   Sodium 133 (L) 135 - 145 mmol/L   Potassium 4.3 3.5 - 5.1 mmol/L   Chloride 102 98 - 111 mmol/L   CO2 15 (L) 22 - 32 mmol/L   Glucose, Bld 220 (H) 70 - 99 mg/dL    Comment: Glucose reference range applies only to samples taken after fasting for at least 8 hours.   BUN 22 (H) 6 - 20 mg/dL   Creatinine, Ser 1.61 (H) 0.61 - 1.24 mg/dL   Calcium 9.9 8.9 - 09.6 mg/dL   Total Protein 7.6 6.5 - 8.1 g/dL   Albumin 4.1 3.5 - 5.0 g/dL   AST 82 (H) 15 - 41 U/L   ALT 125 (H) 0 - 44 U/L   Alkaline Phosphatase 93 38 - 126 U/L   Total Bilirubin 1.2 0.3 - 1.2 mg/dL   GFR, Estimated >04 >54 mL/min    Comment: (NOTE) Calculated using the CKD-EPI Creatinine Equation (2021)    Anion gap 16 (H) 5 - 15    Comment: Performed at [facility], [address] [place], [place] [address]  Lipase, blood     Status: None   Collection Time: 06/07/23  5:43 AM  Result Value Ref Range   Lipase 22 11 - 51 U/L    Comment: Performed at [facility], [address], [place], [place] [address]  CBC with Differential     Status: Abnormal   Collection Time: 06/07/23  5:43 AM  Result Value Ref Range   WBC 6.6 4.0 - 10.5 K/uL   RBC 7.29 (H) 4.22 - 5.81 MIL/uL   Hemoglobin 19.5 (H) 13.0 - 17.0 g/dL   HCT 29.5 (H) 62.1 - 30.8 %   MCV 80.4 80.0 - 100.0 fL   MCH 26.7 26.0 - 34.0 pg   MCHC 33.3 30.0 - 36.0 g/dL   RDW 65.7 84.6 - 96.2 %   Platelets 332 150 - 400 K/uL   nRBC 0.0 0.0 - 0.2 %   Neutrophils Relative % 67 %   Neutro Abs 4.4 1.7 - 7.7 K/uL   Lymphocytes Relative 21 %   Lymphs Abs 1.4 0.7 - 4.0 K/uL   Monocytes Relative 11 %   Monocytes Absolute 0.7 0.1 - 1.0 K/uL   Eosinophils Relative 1 %   Eosinophils Absolute 0.1 0.0 - 0.5 K/uL   Basophils Relative 0 %   Basophils Absolute 0.0 0.0 - 0.1 K/uL   nRBC 0 0 /100 WBC   Abs Immature Granulocytes 0.00 0.00 - 0.07 K/uL    Comment: Performed at [facility], [address] [place], [place] [address]  Urinalysis, Routine w reflex microscopic -Urine, Clean Catch     Status: Abnormal   Collection Time: 06/07/23  6:33 AM  Result Value Ref Range   Color, Urine AMBER (A) YELLOW    Comment: BIOCHEMICALS MAY BE AFFECTED BY COLOR   APPearance HAZY (A) CLEAR  Specific Gravity, Urine 1.032 (H) 1.005 - 1.030   pH 5.0 5.0 - 8.0   Glucose, UA 50 (A) NEGATIVE mg/dL   Hgb urine dipstick NEGATIVE NEGATIVE   Bilirubin Urine SMALL (A) NEGATIVE   Ketones, ur 5 (A) NEGATIVE mg/dL   Protein, ur 956 (A) NEGATIVE mg/dL   Nitrite NEGATIVE NEGATIVE   Leukocytes,Ua NEGATIVE NEGATIVE   RBC / HPF 0-5 0 - 5 RBC/hpf   WBC, UA 0-5 0 - 5 WBC/hpf   Bacteria, UA RARE (A) NONE SEEN   Squamous Epithelial / HPF 0-5 0 - 5 /HPF   Mucus PRESENT    Hyaline Casts, UA PRESENT     Comment: Performed at [facility], [address], [place], [place] [address]   CT ABDOMEN PELVIS W CONTRAST  Result Date: 06/07/2023 CLINICAL DATA:  Acute generalized abdominal pain. EXAM: CT ABDOMEN AND PELVIS WITH CONTRAST TECHNIQUE: Multidetector CT imaging of the abdomen and pelvis was performed using the standard protocol following bolus administration of intravenous contrast. RADIATION DOSE REDUCTION: This exam was performed according to the departmental dose-optimization program which includes automated exposure control, adjustment of the mA and/or kV according to patient size and/or use of iterative reconstruction technique. CONTRAST:  75mL OMNIPAQUE IOHEXOL 350 MG/ML SOLN COMPARISON:  September 03, 2022. FINDINGS: Lower chest: No acute abnormality. Hepatobiliary: Hepatic steatosis. No cholelithiasis or biliary dilatation. Pancreas: Unremarkable. No pancreatic ductal dilatation or surrounding inflammatory changes. Spleen: Normal in size without focal abnormality. Adrenals/Urinary Tract: Adrenal glands appear normal. Small nonobstructive left renal calculus. No hydronephrosis or renal obstruction is noted.  Urinary bladder is unremarkable. Stomach/Bowel: Stomach is within normal limits. Appendix appears normal. No evidence of bowel wall thickening, distention, or inflammatory changes. Vascular/Lymphatic: No significant vascular findings are present. No enlarged abdominal or pelvic lymph nodes. Reproductive: Prostate is unremarkable. Other: No abdominal wall hernia or abnormality. No abdominopelvic ascites. Musculoskeletal: No acute or significant osseous findings. IMPRESSION: Hepatic steatosis. Nonobstructive left nephrolithiasis. Electronically Signed   By: Lupita Raider M.D.   On: 06/07/2023 09:52    Pending Labs Unresulted Labs (From admission, onward)     Start     Ordered   06/07/23 1147  TSH  Once,   URGENT        06/07/23 1146   06/07/23 1147  Gliadin antibodies, serum  (Celiac Panel (PNL))  Once,   URGENT        06/07/23 1146   06/07/23 1147  Tissue transglutaminase, IgA  (Celiac Panel (PNL))  Once,   URGENT        06/07/23 1146   06/07/23 1147  Reticulin Antibody, IgA w reflex titer  (Celiac Panel (PNL))  Once,   URGENT        06/07/23 1146   06/07/23 1146  Sedimentation rate  Once,   URGENT        06/07/23 1146   06/07/23 1146  C-reactive protein  Once,   URGENT        06/07/23 1146   06/07/23 0725  Gastrointestinal Panel by PCR , Stool  (Gastrointestinal Panel by PCR, Stool                                                                                                                                                     **  Does Not include CLOSTRIDIUM DIFFICILE testing. **If CDIFF testing is needed, place order from the "C Difficile Testing" order set.**)  Once,   URGENT        06/07/23 0724   06/07/23 0724  C Difficile Quick Screen w PCR reflex  (C Difficile quick screen w PCR reflex panel )  Once, for 24 hours,   URGENT       References:    CDiff Information Tool   06/07/23 0724            Vitals/Pain Today's Vitals   06/07/23 0830 06/07/23 0931 06/07/23 1005 06/07/23 1024   BP: 137/84 (!) 158/85  (!) 154/106  Pulse: (!) 124 (!) 121  (!) 116  Resp: (!) 22 (!) 22  (!) 22  Temp:  98.1 F (36.7 C)    TempSrc:  Oral    SpO2: 98% 99%  100%  Weight:      Height:      PainSc:  5  Asleep     Isolation Precautions Enteric precautions (UV disinfection)  Medications Medications  sodium chloride 0.9 % bolus 1,000 mL (0 mLs Intravenous Stopped 06/07/23 0929)  LORazepam (ATIVAN) injection 0.5 mg (0.5 mg Intravenous Given 06/07/23 0740)  ondansetron (ZOFRAN) injection 4 mg (4 mg Intravenous Given 06/07/23 0741)  iohexol (OMNIPAQUE) 350 MG/ML injection 75 mL (75 mLs Intravenous Contrast Given 06/07/23 0844)  sodium chloride 0.9 % bolus 1,000 mL (0 mLs Intravenous Stopped 06/07/23 1044)  morphine (PF) 4 MG/ML injection 4 mg (4 mg Intravenous Given 06/07/23 1610)    Mobility walks     Focused Assessments GI   R Recommendations: See Admitting Provider Note  Report given to:   Additional Notes:

## 2023-06-07 NOTE — Assessment & Plan Note (Signed)
Followed by cardiology for MI with stenting in RCA in 2017 Continue medical therapy

## 2023-06-08 DIAGNOSIS — I10 Essential (primary) hypertension: Secondary | ICD-10-CM | POA: Diagnosis not present

## 2023-06-08 DIAGNOSIS — E860 Dehydration: Secondary | ICD-10-CM | POA: Diagnosis not present

## 2023-06-08 DIAGNOSIS — N179 Acute kidney failure, unspecified: Secondary | ICD-10-CM | POA: Diagnosis not present

## 2023-06-08 DIAGNOSIS — E8729 Other acidosis: Secondary | ICD-10-CM | POA: Diagnosis not present

## 2023-06-08 LAB — BASIC METABOLIC PANEL
Anion gap: 7 (ref 5–15)
BUN: 15 mg/dL (ref 6–20)
CO2: 19 mmol/L — ABNORMAL LOW (ref 22–32)
Calcium: 8.6 mg/dL — ABNORMAL LOW (ref 8.9–10.3)
Chloride: 108 mmol/L (ref 98–111)
Creatinine, Ser: 0.96 mg/dL (ref 0.61–1.24)
GFR, Estimated: >60 mL/min (ref >60)
Glucose, Bld: 147 mg/dL — ABNORMAL HIGH (ref 70–99)
Potassium: 3.9 mmol/L (ref 3.5–5.1)
Sodium: 134 mmol/L — ABNORMAL LOW (ref 135–145)

## 2023-06-08 LAB — GASTROINTESTINAL PANEL BY PCR, STOOL (REPLACES STOOL CULTURE)

## 2023-06-08 LAB — CBC
HCT: 48.8 % (ref 39.0–52.0)
Hemoglobin: 15.8 g/dL (ref 13.0–17.0)
MCH: 26.5 pg (ref 26.0–34.0)
MCHC: 32.4 g/dL (ref 30.0–36.0)
MCV: 81.9 fL (ref 80.0–100.0)
Platelets: 268 10*3/uL (ref 150–400)
RBC: 5.96 MIL/uL — ABNORMAL HIGH (ref 4.22–5.81)
RDW: 14.4 % (ref 11.5–15.5)
WBC: 6.8 10*3/uL (ref 4.0–10.5)
nRBC: 0 % (ref 0.0–0.2)

## 2023-06-08 MED ORDER — OXYCODONE HCL 5 MG PO TABS
5.0000 mg | ORAL_TABLET | ORAL | Status: DC | PRN
  Administered 2023-06-08 – 2023-06-11 (×3): 5 mg via ORAL
  Filled 2023-06-08 (×4): qty 1

## 2023-06-08 NOTE — Plan of Care (Signed)
  Problem: Health Behavior/Discharge Planning: Goal: Ability to manage health-related needs will improve Outcome: Progressing   Problem: Clinical Measurements: Goal: Will remain free from infection Outcome: Progressing Goal: Cardiovascular complication will be avoided Outcome: Progressing   Problem: Activity: Goal: Risk for activity intolerance will decrease Outcome: Progressing   Problem: Nutrition: Goal: Adequate nutrition will be maintained Outcome: Progressing   Problem: Coping: Goal: Level of anxiety will decrease Outcome: Progressing   Problem: Elimination: Goal: Will not experience complications related to bowel motility Outcome: Progressing Goal: Will not experience complications related to urinary retention Outcome: Progressing   Problem: Pain Managment: Goal: General experience of comfort will improve Outcome: Progressing   Problem: Safety: Goal: Ability to remain free from injury will improve Outcome: Progressing   Problem: Skin Integrity: Goal: Risk for impaired skin integrity will decrease Outcome: Progressing

## 2023-06-08 NOTE — Plan of Care (Signed)
  Problem: Education: Goal: Knowledge of General Education information will improve Description Including pain rating scale, medication(s)/side effects and non-pharmacologic comfort measures Outcome: Progressing   

## 2023-06-08 NOTE — Progress Notes (Signed)
[name]                                                                               Richard Hull, is a 47 y.o. male, DOB - Oct 15, 1975, ZOX:[id number] Admit date - 06/07/2023    Outpatient Primary MD for the patient is [name], MD  LOS - 0  days    Brief summary    Richard Hull is a 47 y.o. male with medical history significant of  hypertension, CAD, dyslipidemia, OCD, OSA and nephrolithiasis who presented to ED with complaints of 3-4 days of nausea/vomiting, diarrhea and abdominal pain with poor PO intake. Wednesday morning he woke up with violent vomiting, diarrhea and foul smelling burps.    Assessment & Plan    Assessment and Plan: * Acute kidney injury secondary to dehydration from N/V/D. Renal parameters improved with IV fluids. No nausea, vomiting or abd pain today.  Diarrhea has improved.  Similar presentation in January and both times he had started a GLP-1 drug No pancreatitis/cholecystitis or diverticulitis on imaging/labs.  Colonoscopy in 12/23 wnl  C diff pcr is negative. Gi panel is negative.  Continue enteric precautions   Metabolic acidosis, increased anion gap Secondary to AKI  No SIRS criteria, only tachycardia  Improved to 19 today.   Transaminitis He has fatty liver and states his liver enzymes are typically a little elevated  He is at baseline when compared to labs from 03/2023  Encouraged weight loss/dietary changes  Repeat liver panel in 4 weeks.   Polycythemia Hemo concentrated sample.  Hemoglobin is 15 today.   CAD (coronary artery disease) Followed by cardiology for MI with stenting in RCA in 2017 Continue medical therapy   HTN (hypertension) Well controlled.  Resume home meds.   Hyperlipidemia LDL goal <70 Continue crestor 20mg    OSA on CPAP Continue cpap at night        Estimated body mass index is 36.92 kg/m as calculated from the following:   Height as of this encounter: 5\' 9"  (1.753 m).    Weight as of this encounter: 113.4 kg.  Code Status: full code.  DVT Prophylaxis:  enoxaparin (LOVENOX) injection 40 mg Start: 06/07/23 1400   Level of Care: Level of care: Telemetry Medical Family Communication: none at bedside.   Disposition Plan:     Remains inpatient appropriate:  possible d/c in the next 24 hours.   Procedures:  None.   Consultants:   None.   Antimicrobials:   Anti-infectives (From admission, onward)    None        Medications  Scheduled Meds:  amLODipine  10 mg Oral QPM   aspirin EC  81 mg Oral QPM   enoxaparin (LOVENOX) injection  40 mg Subcutaneous Q24H   icosapent Ethyl  2 g Oral BID   metoprolol succinate  50 mg Oral Daily   rosuvastatin  20 mg Oral QPM   Continuous Infusions: PRN Meds:.acetaminophen **OR** acetaminophen, metoprolol tartrate, morphine injection, ondansetron **OR** ondansetron (ZOFRAN) IV    Subjective:   [name] was seen and examined today. No nausea, vomiting. No abd pain.   Objective:   Vitals:   06/07/23 2015 06/08/23  0019 06/08/23 0442 06/08/23 0842  BP: 126/88 (!) 141/93 (!) 138/93 (!) 140/78  Pulse: (!) 105 95 83 92  Resp: 16 16 16 20   Temp: 98.8 F (37.1 C) 98.1 F (36.7 C) 98.2 F (36.8 C) 97.9 F (36.6 C)  TempSrc: Oral Oral Oral Oral  SpO2: 97% 99% 98% 97%  Weight:      Height:        Intake/Output Summary (Last 24 hours) at 06/08/2023 1525 Last data filed at 06/08/2023 1514 Gross per 24 hour  Intake 970 ml  Output 0 ml  Net 970 ml   Filed Weights   06/07/23 0737  Weight: 113.4 kg     Exam General exam: Appears calm and comfortable  Respiratory system: Clear to auscultation. Respiratory effort normal. Cardiovascular system: S1 & S2 heard, RRR.  Gastrointestinal system: Abdomen is nondistended, soft and nontender.  Central nervous system: Alert and oriented. No focal neurological deficits. Extremities: Symmetric 5 x 5 power. Skin: No rashes, lesions or ulcers Psychiatry:  mood is appropriate.    Data Reviewed:  I have personally reviewed following labs and imaging studies   CBC Lab Results  Component Value Date   WBC 6.8 06/08/2023   RBC 5.96 (H) 06/08/2023   HGB 15.8 06/08/2023   HCT 48.8 06/08/2023   MCV 81.9 06/08/2023   MCH 26.5 06/08/2023   PLT 268 06/08/2023   MCHC 32.4 06/08/2023   RDW 14.4 06/08/2023   LYMPHSABS 1.4 06/07/2023   MONOABS 0.7 06/07/2023   EOSABS 0.1 06/07/2023   BASOSABS 0.0 06/07/2023     Last metabolic panel Lab Results  Component Value Date   NA 134 (L) 06/08/2023   K 3.9 06/08/2023   CL 108 06/08/2023   CO2 19 (L) 06/08/2023   BUN 15 06/08/2023   CREATININE 0.96 06/08/2023   GLUCOSE 147 (H) 06/08/2023   GFRNONAA >60 06/08/2023   GFRAA >60 05/01/2017   CALCIUM 8.6 (L) 06/08/2023   PROT 7.6 06/07/2023   ALBUMIN 4.1 06/07/2023   LABGLOB 2.1 02/21/2022   AGRATIO 2.1 02/21/2022   BILITOT 1.2 06/07/2023   ALKPHOS 93 06/07/2023   AST 82 (H) 06/07/2023   ALT 125 (H) 06/07/2023   ANIONGAP 7 06/08/2023    CBG (last 3)  No results for input(s): "GLUCAP" in the last 72 hours.    Coagulation Profile: No results for input(s): "INR", "PROTIME" in the last 168 hours.   Radiology Studies: CT ABDOMEN PELVIS W CONTRAST  Result Date: 06/07/2023 CLINICAL DATA:  Acute generalized abdominal pain. EXAM: CT ABDOMEN AND PELVIS WITH CONTRAST TECHNIQUE: Multidetector CT imaging of the abdomen and pelvis was performed using the standard protocol following bolus administration of intravenous contrast. RADIATION DOSE REDUCTION: This exam was performed according to the departmental dose-optimization program which includes automated exposure control, adjustment of the mA and/or kV according to patient size and/or use of iterative reconstruction technique. CONTRAST:  75mL OMNIPAQUE IOHEXOL 350 MG/ML SOLN COMPARISON:  September 03, 2022. FINDINGS: Lower chest: No acute abnormality. Hepatobiliary: Hepatic steatosis. No cholelithiasis  or biliary dilatation. Pancreas: Unremarkable. No pancreatic ductal dilatation or surrounding inflammatory changes. Spleen: Normal in size without focal abnormality. Adrenals/Urinary Tract: Adrenal glands appear normal. Small nonobstructive left renal calculus. No hydronephrosis or renal obstruction is noted. Urinary bladder is unremarkable. Stomach/Bowel: Stomach is within normal limits. Appendix appears normal. No evidence of bowel wall thickening, distention, or inflammatory changes. Vascular/Lymphatic: No significant vascular findings are present. No enlarged abdominal or pelvic lymph nodes. Reproductive: Prostate is  unremarkable. Other: No abdominal wall hernia or abnormality. No abdominopelvic ascites. Musculoskeletal: No acute or significant osseous findings. IMPRESSION: Hepatic steatosis. Nonobstructive left nephrolithiasis. Electronically Signed   By: Lupita Raider M.D.   On: 06/07/2023 09:52       Kathlen Mody M.D. [name] 06/08/2023, 3:25 PM  Available via Epic secure chat 7am-7pm After 7 pm, please refer to night coverage provider listed on amion.

## 2023-06-09 DIAGNOSIS — R1084 Generalized abdominal pain: Secondary | ICD-10-CM | POA: Diagnosis present

## 2023-06-09 DIAGNOSIS — Z888 Allergy status to other drugs, medicaments and biological substances status: Secondary | ICD-10-CM | POA: Diagnosis not present

## 2023-06-09 DIAGNOSIS — R197 Diarrhea, unspecified: Secondary | ICD-10-CM

## 2023-06-09 DIAGNOSIS — Z955 Presence of coronary angioplasty implant and graft: Secondary | ICD-10-CM | POA: Diagnosis not present

## 2023-06-09 DIAGNOSIS — Z83438 Family history of other disorder of lipoprotein metabolism and other lipidemia: Secondary | ICD-10-CM | POA: Diagnosis not present

## 2023-06-09 DIAGNOSIS — Z808 Family history of malignant neoplasm of other organs or systems: Secondary | ICD-10-CM | POA: Diagnosis not present

## 2023-06-09 DIAGNOSIS — I10 Essential (primary) hypertension: Secondary | ICD-10-CM

## 2023-06-09 DIAGNOSIS — E8729 Other acidosis: Secondary | ICD-10-CM | POA: Diagnosis not present

## 2023-06-09 DIAGNOSIS — R112 Nausea with vomiting, unspecified: Secondary | ICD-10-CM | POA: Diagnosis present

## 2023-06-09 DIAGNOSIS — E785 Hyperlipidemia, unspecified: Secondary | ICD-10-CM

## 2023-06-09 DIAGNOSIS — Z8249 Family history of ischemic heart disease and other diseases of the circulatory system: Secondary | ICD-10-CM | POA: Diagnosis not present

## 2023-06-09 DIAGNOSIS — E872 Acidosis, unspecified: Secondary | ICD-10-CM | POA: Diagnosis present

## 2023-06-09 DIAGNOSIS — Z880 Allergy status to penicillin: Secondary | ICD-10-CM | POA: Diagnosis not present

## 2023-06-09 DIAGNOSIS — I252 Old myocardial infarction: Secondary | ICD-10-CM | POA: Diagnosis not present

## 2023-06-09 DIAGNOSIS — N200 Calculus of kidney: Secondary | ICD-10-CM | POA: Diagnosis present

## 2023-06-09 DIAGNOSIS — E876 Hypokalemia: Secondary | ICD-10-CM | POA: Diagnosis present

## 2023-06-09 DIAGNOSIS — K521 Toxic gastroenteritis and colitis: Secondary | ICD-10-CM | POA: Diagnosis present

## 2023-06-09 DIAGNOSIS — I2510 Atherosclerotic heart disease of native coronary artery without angina pectoris: Secondary | ICD-10-CM | POA: Diagnosis present

## 2023-06-09 DIAGNOSIS — Y92009 Unspecified place in unspecified non-institutional (private) residence as the place of occurrence of the external cause: Secondary | ICD-10-CM | POA: Diagnosis not present

## 2023-06-09 DIAGNOSIS — E781 Pure hyperglyceridemia: Secondary | ICD-10-CM | POA: Diagnosis present

## 2023-06-09 DIAGNOSIS — R7401 Elevation of levels of liver transaminase levels: Secondary | ICD-10-CM

## 2023-06-09 DIAGNOSIS — Z981 Arthrodesis status: Secondary | ICD-10-CM | POA: Diagnosis not present

## 2023-06-09 DIAGNOSIS — T383X5A Adverse effect of insulin and oral hypoglycemic [antidiabetic] drugs, initial encounter: Secondary | ICD-10-CM | POA: Diagnosis present

## 2023-06-09 DIAGNOSIS — E860 Dehydration: Secondary | ICD-10-CM

## 2023-06-09 DIAGNOSIS — K7401 Hepatic fibrosis, early fibrosis: Secondary | ICD-10-CM | POA: Diagnosis present

## 2023-06-09 DIAGNOSIS — D751 Secondary polycythemia: Secondary | ICD-10-CM | POA: Diagnosis present

## 2023-06-09 DIAGNOSIS — N179 Acute kidney failure, unspecified: Secondary | ICD-10-CM | POA: Diagnosis not present

## 2023-06-09 DIAGNOSIS — Z823 Family history of stroke: Secondary | ICD-10-CM | POA: Diagnosis not present

## 2023-06-09 DIAGNOSIS — K760 Fatty (change of) liver, not elsewhere classified: Secondary | ICD-10-CM | POA: Diagnosis present

## 2023-06-09 DIAGNOSIS — G4733 Obstructive sleep apnea (adult) (pediatric): Secondary | ICD-10-CM | POA: Diagnosis present

## 2023-06-09 LAB — BASIC METABOLIC PANEL
Anion gap: 12 (ref 5–15)
BUN: 14 mg/dL (ref 6–20)
CO2: 15 mmol/L — ABNORMAL LOW (ref 22–32)
Calcium: 8.6 mg/dL — ABNORMAL LOW (ref 8.9–10.3)
Chloride: 109 mmol/L (ref 98–111)
Creatinine, Ser: 1.05 mg/dL (ref 0.61–1.24)
GFR, Estimated: >60 mL/min (ref >60)
Glucose, Bld: 147 mg/dL — ABNORMAL HIGH (ref 70–99)
Potassium: 3.7 mmol/L (ref 3.5–5.1)
Sodium: 136 mmol/L (ref 135–145)

## 2023-06-09 LAB — HEPATIC FUNCTION PANEL
ALT: 78 U/L — ABNORMAL HIGH (ref 0–44)
AST: 42 U/L — ABNORMAL HIGH (ref 15–41)
Albumin: 3.3 g/dL — ABNORMAL LOW (ref 3.5–5.0)
Alkaline Phosphatase: 79 U/L (ref 38–126)
Bilirubin, Direct: 0.1 mg/dL (ref 0.0–0.2)
Indirect Bilirubin: 0.4 mg/dL (ref 0.3–0.9)
Total Bilirubin: 0.5 mg/dL (ref 0.3–1.2)
Total Protein: 6.2 g/dL — ABNORMAL LOW (ref 6.5–8.1)

## 2023-06-09 LAB — TISSUE TRANSGLUTAMINASE, IGA: Tissue Transglutaminase Ab, IgA: <2 U/mL (ref 0–3)

## 2023-06-09 MED ORDER — LACTATED RINGERS IV SOLN: INTRAVENOUS | Status: DC

## 2023-06-09 MED ORDER — SIMETHICONE 40 MG/0.6ML PO SUSP
40.0000 mg | Freq: Four times a day (QID) | ORAL | Status: DC | PRN
  Administered 2023-06-09 – 2023-06-10 (×2): 40 mg via ORAL
  Filled 2023-06-09 (×3): qty 0.6

## 2023-06-09 MED ORDER — LOPERAMIDE HCL 2 MG PO CAPS
2.0000 mg | ORAL_CAPSULE | ORAL | Status: DC | PRN
  Administered 2023-06-10 – 2023-06-11 (×2): 2 mg via ORAL
  Filled 2023-06-09 (×2): qty 1

## 2023-06-09 NOTE — Progress Notes (Signed)
[name]                                                                               Mahmud [name], is a 47 y.o. male, DOB - 1975-10-31, ZOX:[id number] Admit date - 06/07/2023    Outpatient Primary MD for the patient is [name], MD  LOS - 0  days    Brief summary    Richard Hull is a 47 y.o. male with medical history significant of  hypertension, CAD, dyslipidemia, OCD, OSA and nephrolithiasis who presented to ED with complaints of 3-4 days of nausea/vomiting, diarrhea and abdominal pain with poor PO intake. Wednesday morning he woke up with violent vomiting, diarrhea and foul smelling burps.    Assessment & Plan    Assessment and Plan: * Acute kidney injury secondary to dehydration from N/V/D. Renal parameters improved with IV fluids. No nausea, vomiting or abd pain today.  Persistent diarrhea about 10 times since last night.  Similar presentation in January and both times he had started a GLP-1 drug No pancreatitis/cholecystitis or diverticulitis on imaging/labs.  Colonoscopy in 12/23 wnl  C diff pcr is negative. Gi panel is negative.  Repeat labs today and start him on IV fluids.   Metabolic acidosis, increased anion gap Secondary to AKI  No SIRS criteria, only tachycardia  Improved to 19 , repeat labs are pending.  Transaminitis He has fatty liver and states his liver enzymes are typically a little elevated  He is at baseline when compared to labs from 03/2023  Encouraged weight loss/dietary changes  Repeat liver panel in 4 weeks.   Polycythemia Hemo concentrated sample.  Hemoglobin is 15 today.   CAD (coronary artery disease) Followed by cardiology for MI with stenting in RCA in 2017 Continue medical therapy . No chest pain or sob.    HTN (hypertension) Optimal.  Resume home meds.   Hyperlipidemia LDL goal <70 Continue crestor 20mg    OSA on CPAP Continue cpap at night     Estimated body mass index is 36.92 kg/m as  calculated from the following:   Height as of this encounter: 5\' 9"  (1.753 m).   Weight as of this encounter: 113.4 kg.  Code Status: full code.  DVT Prophylaxis:  enoxaparin (LOVENOX) injection 40 mg Start: 06/07/23 1400   Level of Care: Level of care: Telemetry Medical Family Communication: none at bedside.   Disposition Plan:     Remains inpatient appropriate:  possible d/c in the next 24 hours pending improvement in diarrhea.   Procedures:  None.   Consultants:   None.   Antimicrobials:   Anti-infectives (From admission, onward)    None        Medications  Scheduled Meds:  amLODipine  10 mg Oral QPM   aspirin EC  81 mg Oral QPM   enoxaparin (LOVENOX) injection  40 mg Subcutaneous Q24H   icosapent Ethyl  2 g Oral BID   metoprolol succinate  50 mg Oral Daily   rosuvastatin  20 mg Oral QPM   Continuous Infusions:  lactated ringers 75 mL/hr at 06/09/23 1122   PRN Meds:.acetaminophen **OR** acetaminophen, metoprolol tartrate, morphine injection, ondansetron **OR** ondansetron (ZOFRAN)  IV, oxyCODONE    Subjective:   Richard Hull was seen and examined today. Persistent diarrhea 10 episodes since yesterday. With abdominal cramping, no nausea, or vomiting.   Objective:   Vitals:   06/08/23 1623 06/08/23 2045 06/09/23 0440 06/09/23 0843  BP: (!) 152/97 (!) 147/84 128/81 (!) 152/96  Pulse: 97 (!) 102 87 100  Resp:  16 17   Temp: 98.2 F (36.8 C) 99 F (37.2 C) 97.7 F (36.5 C) 98.4 F (36.9 C)  TempSrc:  Oral Oral   SpO2: 98% 97% 100% 99%  Weight:      Height:        Intake/Output Summary (Last 24 hours) at 06/09/2023 1343 Last data filed at 06/09/2023 0903 Gross per 24 hour  Intake 760 ml  Output --  Net 760 ml   Filed Weights   06/07/23 0737  Weight: 113.4 kg     Exam General exam: Appears calm and comfortable  Respiratory system: Clear to auscultation. Respiratory effort normal. Cardiovascular system: S1 & S2 heard, RRR. No JVD,   Gastrointestinal system: Abdomen is nondistended, soft and nontender.  Central nervous system: Alert and oriented. No focal neurological deficits. Extremities: Symmetric 5 x 5 power. Skin: No rashes, Psychiatry: Mood & affect appropriate.    Data Reviewed:  I have personally reviewed following labs and imaging studies   CBC Lab Results  Component Value Date   WBC 6.8 06/08/2023   RBC 5.96 (H) 06/08/2023   HGB 15.8 06/08/2023   HCT 48.8 06/08/2023   MCV 81.9 06/08/2023   MCH 26.5 06/08/2023   PLT 268 06/08/2023   MCHC 32.4 06/08/2023   RDW 14.4 06/08/2023   LYMPHSABS 1.4 06/07/2023   MONOABS 0.7 06/07/2023   EOSABS 0.1 06/07/2023   BASOSABS 0.0 06/07/2023     Last metabolic panel Lab Results  Component Value Date   NA 134 (L) 06/08/2023   K 3.9 06/08/2023   CL 108 06/08/2023   CO2 19 (L) 06/08/2023   BUN 15 06/08/2023   CREATININE 0.96 06/08/2023   GLUCOSE 147 (H) 06/08/2023   GFRNONAA >60 06/08/2023   GFRAA >60 05/01/2017   CALCIUM 8.6 (L) 06/08/2023   PROT 7.6 06/07/2023   ALBUMIN 4.1 06/07/2023   LABGLOB 2.1 02/21/2022   AGRATIO 2.1 02/21/2022   BILITOT 1.2 06/07/2023   ALKPHOS 93 06/07/2023   AST 82 (H) 06/07/2023   ALT 125 (H) 06/07/2023   ANIONGAP 7 06/08/2023    CBG (last 3)  No results for input(s): "GLUCAP" in the last 72 hours.    Coagulation Profile: No results for input(s): "INR", "PROTIME" in the last 168 hours.   Radiology Studies: No results found.     Kathlen Mody M.D. [name] 06/09/2023, 1:43 PM  Available via Epic secure chat 7am-7pm After 7 pm, please refer to night coverage provider listed on amion.

## 2023-06-09 NOTE — Plan of Care (Signed)

## 2023-06-09 NOTE — Progress Notes (Signed)
   06/08/23 2230  BiPAP/CPAP/SIPAP  Reason BIPAP/CPAP not in use Nausea/Vomiting

## 2023-06-09 NOTE — Progress Notes (Signed)
   06/09/23 2006  BiPAP/CPAP/SIPAP  BiPAP/CPAP/SIPAP Pt Type Adult  Patient Home Equipment Yes (patient home unit CPAP at bedside. no assistance is needed)

## 2023-06-10 DIAGNOSIS — E8729 Other acidosis: Secondary | ICD-10-CM | POA: Diagnosis not present

## 2023-06-10 DIAGNOSIS — N179 Acute kidney failure, unspecified: Secondary | ICD-10-CM | POA: Diagnosis not present

## 2023-06-10 DIAGNOSIS — I10 Essential (primary) hypertension: Secondary | ICD-10-CM | POA: Diagnosis not present

## 2023-06-10 DIAGNOSIS — E860 Dehydration: Secondary | ICD-10-CM | POA: Diagnosis not present

## 2023-06-10 LAB — GLIADIN ANTIBODIES, SERUM
Antigliadin Abs, IgA: 15 U (ref 0–19)
Gliadin IgG: 3 U (ref 0–19)

## 2023-06-10 MED ORDER — HYDRALAZINE HCL 25 MG PO TABS
25.0000 mg | ORAL_TABLET | Freq: Three times a day (TID) | ORAL | Status: DC | PRN
  Administered 2023-06-10: 25 mg via ORAL
  Filled 2023-06-10: qty 1

## 2023-06-10 MED ORDER — SODIUM CHLORIDE 0.45 % IV SOLN
INTRAVENOUS | Status: AC
  Filled 2023-06-10 (×3): qty 75

## 2023-06-10 NOTE — TOC CM/SW Note (Signed)
Transition of Care [facility]) - Inpatient Brief Assessment   Patient Details  Name: Richard Hull MRN: [id number] Date of Birth: 06-15-1976  Transition of [facility] CM/SW Contact:    Tom-Johnson, Hershal Coria, RN Phone Number: 06/10/2023, 1:28 PM   Clinical Narrative:  Patient presented to the ED with Abdominal Pain, Vomiting and Diarrhea.   From home with wife and three children. Employed, independent. Both parent, siblings supportive. No DME's at home. PCP is [name], MD and uses [facility] on [address] [facility] in [place].   No TOC needs or recommendations noted at this time.  Patient not Medically ready for discharge.  CM will continue to follow as patient progresses with care towards discharge.           Transition of Care Asessment: Insurance and Status: Insurance coverage has been reviewed Patient has primary care physician: Yes Home environment has been reviewed: Yes Prior level of function:: Independent Prior/Current Home Services: No current home services Social Determinants of Health Reivew: SDOH reviewed no interventions necessary Readmission risk has been reviewed: Yes Transition of care needs: no transition of care needs at this time

## 2023-06-10 NOTE — Progress Notes (Signed)
[name]                                                                               Richard Hull, is a 47 y.o. male, DOB - 01-13-1976, GGY:[id number] Admit date - 06/07/2023    Outpatient Primary MD for the patient is [name], MD  LOS - 1  days    Brief summary    Richard Hull is a 47 y.o. male with medical history significant of  hypertension, CAD, dyslipidemia, OCD, OSA and nephrolithiasis who presented to ED with complaints of 3-4 days of nausea/vomiting, diarrhea and abdominal pain with poor PO intake. Wednesday morning he woke up with violent vomiting, diarrhea and foul smelling burps.    Assessment & Plan    Assessment and Plan: * Acute kidney injury secondary to dehydration from N/V/D. Renal parameters improved with IV fluids. No nausea, vomiting or abd pain today.  Persistent diarrhea about 10 times since last night.  Similar presentation in January and both times he had started a GLP-1 drug No pancreatitis/cholecystitis or diverticulitis on imaging/labs.  Colonoscopy in 12/23 wnl  C diff pcr is negative. Gi panel is negative.  Started him IV  fluids.    Diarrhea  Probably secondary to GLP1 drug  and slowly improving.  Monitor.   Metabolic acidosis, Non anion gap Secondary to AKI  Bicarb is 15 today. Started him on bicarb gtt.   Transaminitis He has fatty liver and states his liver enzymes are typically a little elevated  He is at baseline when compared to labs from 03/2023  Encouraged weight loss/dietary changes  Repeat liver panel in 4 weeks.   Polycythemia Hemo concentrated sample on admission.   Hemoglobin is 15 today.   CAD (coronary artery disease) Followed by cardiology for MI with stenting in RCA in 2017 Continue medical therapy . No chest pain or sob.    HTN (hypertension) Suboptimally controlled.  Continue with norvasc, metoprolol, and hydralazine prn.   Hyperlipidemia LDL goal <70 Continue crestor 20mg    OSA  on CPAP Continue cpap at night     Estimated body mass index is 36.92 kg/m as calculated from the following:   Height as of this encounter: 5\' 9"  (1.753 m).   Weight as of this encounter: 113.4 kg.  Code Status: full code.  DVT Prophylaxis:  enoxaparin (LOVENOX) injection 40 mg Start: 06/07/23 1400   Level of Care: Level of care: Med-Surg Family Communication: none at bedside.   Disposition Plan:     Remains inpatient appropriate:  possible d/c in the next 24 hours pending improvement in diarrhea.   Procedures:  None.   Consultants:   None.   Antimicrobials:   Anti-infectives (From admission, onward)    None        Medications  Scheduled Meds:  amLODipine  10 mg Oral QPM   aspirin EC  81 mg Oral QPM   enoxaparin (LOVENOX) injection  40 mg Subcutaneous Q24H   icosapent Ethyl  2 g Oral BID   metoprolol succinate  50 mg Oral Daily   rosuvastatin  20 mg Oral QPM   Continuous Infusions:  sodium bicarbonate 75 mEq in sodium  chloride 0.45 % 1,075 mL infusion 100 mL/hr at 06/10/23 1441   PRN Meds:.acetaminophen **OR** acetaminophen, hydrALAZINE, loperamide, morphine injection, ondansetron **OR** ondansetron (ZOFRAN) IV, oxyCODONE, simethicone    Subjective:   Richard Hull was seen and examined today. Diarrhea has improved with one dose of imodium.   Objective:   Vitals:   06/09/23 1900 06/10/23 0547 06/10/23 0823 06/10/23 1608  BP: 128/86 (!) 144/89 (!) 147/81 (!) 161/93  Pulse: 100 96 93 94  Resp: 18 18  17   Temp: 99.4 F (37.4 C) 98.2 F (36.8 C) 98.8 F (37.1 C) 98.4 F (36.9 C)  TempSrc: Oral  Oral Oral  SpO2: 99% 97% 98% 96%  Weight:      Height:        Intake/Output Summary (Last 24 hours) at 06/10/2023 1709 Last data filed at 06/10/2023 1300 Gross per 24 hour  Intake 1594.26 ml  Output 0 ml  Net 1594.26 ml   Filed Weights   06/07/23 0737  Weight: 113.4 kg     Exam General exam: Appears calm and comfortable  Respiratory system:  Clear to auscultation. Respiratory effort normal. Cardiovascular system: S1 & S2 heard, RRR. No JVD, Gastrointestinal system: Abdomen is nondistended, soft and nontender.  Central nervous system: Alert and oriented.  Extremities: Symmetric 5 x 5 power. Skin: No rashes,  Psychiatry:  Mood & affect appropriate.     Data Reviewed:  I have personally reviewed following labs and imaging studies   CBC Lab Results  Component Value Date   WBC 6.8 06/08/2023   RBC 5.96 (H) 06/08/2023   HGB 15.8 06/08/2023   HCT 48.8 06/08/2023   MCV 81.9 06/08/2023   MCH 26.5 06/08/2023   PLT 268 06/08/2023   MCHC 32.4 06/08/2023   RDW 14.4 06/08/2023   LYMPHSABS 1.4 06/07/2023   MONOABS 0.7 06/07/2023   EOSABS 0.1 06/07/2023   BASOSABS 0.0 06/07/2023     Last metabolic panel Lab Results  Component Value Date   NA 136 06/09/2023   K 3.7 06/09/2023   CL 109 06/09/2023   CO2 15 (L) 06/09/2023   BUN 14 06/09/2023   CREATININE 1.05 06/09/2023   GLUCOSE 147 (H) 06/09/2023   GFRNONAA >60 06/09/2023   GFRAA >60 05/01/2017   CALCIUM 8.6 (L) 06/09/2023   PROT 6.2 (L) 06/09/2023   ALBUMIN 3.3 (L) 06/09/2023   LABGLOB 2.1 02/21/2022   AGRATIO 2.1 02/21/2022   BILITOT 0.5 06/09/2023   ALKPHOS 79 06/09/2023   AST 42 (H) 06/09/2023   ALT 78 (H) 06/09/2023   ANIONGAP 12 06/09/2023    CBG (last 3)  No results for input(s): "GLUCAP" in the last 72 hours.    Coagulation Profile: No results for input(s): "INR", "PROTIME" in the last 168 hours.   Radiology Studies: No results found.     Kathlen Mody M.D. [name] 06/10/2023, 5:09 PM  Available via Epic secure chat 7am-7pm After 7 pm, please refer to night coverage provider listed on amion.

## 2023-06-10 NOTE — Progress Notes (Signed)
Patient to self-administer CPAP using his machine from home.  Patient is familiar with equipment and procedure.  

## 2023-06-11 ENCOUNTER — Ambulatory Visit: Payer: Self-pay

## 2023-06-11 DIAGNOSIS — N179 Acute kidney failure, unspecified: Secondary | ICD-10-CM | POA: Diagnosis not present

## 2023-06-11 LAB — RAPID URINE DRUG SCREEN, HOSP PERFORMED
Amphetamines: NOT DETECTED
Barbiturates: NOT DETECTED
Benzodiazepines: NOT DETECTED
Cocaine: NOT DETECTED
Opiates: POSITIVE — AB
Tetrahydrocannabinol: NOT DETECTED

## 2023-06-11 LAB — BASIC METABOLIC PANEL
Anion gap: 16 — ABNORMAL HIGH (ref 5–15)
BUN: 9 mg/dL (ref 6–20)
CO2: 19 mmol/L — ABNORMAL LOW (ref 22–32)
Calcium: 9.3 mg/dL (ref 8.9–10.3)
Chloride: 105 mmol/L (ref 98–111)
Creatinine, Ser: 1 mg/dL (ref 0.61–1.24)
GFR, Estimated: >60 mL/min (ref >60)
Glucose, Bld: 143 mg/dL — ABNORMAL HIGH (ref 70–99)
Potassium: 3 mmol/L — ABNORMAL LOW (ref 3.5–5.1)
Sodium: 140 mmol/L (ref 135–145)

## 2023-06-11 LAB — PHOSPHORUS: Phosphorus: 4 mg/dL (ref 2.5–4.6)

## 2023-06-11 LAB — MAGNESIUM: Magnesium: 1.7 mg/dL (ref 1.7–2.4)

## 2023-06-11 MED ORDER — SODIUM BICARBONATE 650 MG PO TABS
650.0000 mg | ORAL_TABLET | Freq: Three times a day (TID) | ORAL | Status: DC
  Administered 2023-06-11 – 2023-06-12 (×2): 650 mg via ORAL
  Filled 2023-06-11 (×2): qty 1

## 2023-06-11 MED ORDER — PANTOPRAZOLE SODIUM 40 MG PO TBEC
40.0000 mg | DELAYED_RELEASE_TABLET | Freq: Two times a day (BID) | ORAL | Status: DC
  Administered 2023-06-11 – 2023-06-12 (×2): 40 mg via ORAL
  Filled 2023-06-11 (×2): qty 1

## 2023-06-11 MED ORDER — SACCHAROMYCES BOULARDII 250 MG PO CAPS
250.0000 mg | ORAL_CAPSULE | Freq: Two times a day (BID) | ORAL | Status: DC
  Administered 2023-06-11 – 2023-06-12 (×4): 250 mg via ORAL
  Filled 2023-06-11 (×4): qty 1

## 2023-06-11 MED ORDER — DICYCLOMINE HCL 10 MG PO CAPS
10.0000 mg | ORAL_CAPSULE | Freq: Three times a day (TID) | ORAL | Status: DC
  Administered 2023-06-11 – 2023-06-12 (×4): 10 mg via ORAL
  Filled 2023-06-11 (×6): qty 1

## 2023-06-11 MED ORDER — POTASSIUM CHLORIDE CRYS ER 20 MEQ PO TBCR
40.0000 meq | EXTENDED_RELEASE_TABLET | ORAL | Status: AC
  Administered 2023-06-11 (×3): 40 meq via ORAL
  Filled 2023-06-11 (×2): qty 2

## 2023-06-11 MED ORDER — DICYCLOMINE HCL 10 MG PO CAPS
10.0000 mg | ORAL_CAPSULE | Freq: Once | ORAL | Status: AC
  Administered 2023-06-11: 10 mg via ORAL
  Filled 2023-06-11: qty 1

## 2023-06-11 MED ORDER — PANCRELIPASE (LIP-PROT-AMYL) 12000-38000 UNITS PO CPEP
24000.0000 [IU] | ORAL_CAPSULE | Freq: Three times a day (TID) | ORAL | Status: DC
  Administered 2023-06-11 – 2023-06-12 (×3): 24000 [IU] via ORAL
  Filled 2023-06-11 (×3): qty 2

## 2023-06-11 NOTE — Progress Notes (Signed)
[facility] Progress Note  Patient: Richard Hull    ZOX:[id number]  DOA: 06/07/2023     Date of Service: the patient was seen and examined on 06/11/2023  Chief Complaint  Patient presents with   Abdominal Pain   Brief hospital course: [name] is a 47 y.o. male with medical history significant of  hypertension, CAD, dyslipidemia, OCD, OSA and nephrolithiasis who presented to ED with complaints of 3-4 days of nausea/vomiting, diarrhea and abdominal pain with poor PO intake. Wednesday morning he woke up with violent vomiting, diarrhea and foul smelling burps.    Assessment and Plan:  # Acute kidney injury secondary to dehydration from N/V/D. S/p IV fluid and bicarb IV infusion Renal functions improved.  AKI resolved. Persistent diarrhea about 10 times yesterday and 4 times in the morning. Similar presentation in January and both times he had started a GLP-1 drug No pancreatitis/cholecystitis or diverticulitis on imaging/labs.  Colonoscopy in 12/23 wnl  C diff pcr is negative. Gi panel is negative.  S/p IV  fluids and bicarb IV infusion.  Continue oral hydration. Reassess for IV fluids tomorrow a.m.     Diarrhea  Probably secondary to GLP1 drug  and slowly improving.  Monitor electrolytes Continue Bentyl 10 mg p.o. 3 times daily, probiotics twice daily, PPI twice daily, Creon 3 times daily with food.  Hypokalemia, potassium repleted. Monitor electrolytes.  Metabolic acidosis, Non anion gap Secondary to AKI  Bicarb was 15 on 10/21, s/p bicarb gtt. 10/22 co2 19 today improved, started sodium bicarbonate 650 mg p.o. 3 times daily for 3 days Monitor BMP daily.     Transaminitis He has fatty liver and states his liver enzymes are typically a little elevated  He is at baseline when compared to labs from 03/2023  Encouraged weight loss/dietary changes  Repeat liver panel in 4 weeks.    Polycythemia Hemo concentrated sample on admission.   Hemoglobin is 15.8  today.    CAD (coronary artery disease) Followed by cardiology for MI with stenting in RCA in 2017 Continue medical therapy . No chest pain or sob.      HTN (hypertension) Suboptimally controlled.  Continue with norvasc, metoprolol, and hydralazine prn.    Hyperlipidemia LDL goal <70 Continue crestor 20mg     OSA on CPAP Continue cpap at night      Body mass index is 36.92 kg/m.  Interventions:  Diet: Regular diet DVT Prophylaxis: Subcutaneous Lovenox   Advance goals of care discussion: Full code  Family Communication: family was present at bedside, at the time of interview.  The pt provided permission to discuss medical plan with the family. Opportunity was given to ask question and all questions were answered satisfactorily.   Disposition:  Pt is from home, admitted with diarrhea, dehydration, still has significant diarrhea and electrolyte imbalance, which precludes a safe discharge. Discharge to home, when stable, may need few more days to stay in the hospital.  Subjective: Patient was seen and examined at bedside.  Patient is still having significant diarrhea, denies any nausea vomiting, tolerating clear liquid diet well.  Patient is having cramping pain in the abdomen off and on with diarrhea, denies constant pain.  Physical Exam: General: NAD, lying comfortably Appear in no distress, affect appropriate Eyes: PERRLA ENT: Oral Mucosa Clear, moist  Neck: no JVD,  Cardiovascular: S1 and S2 Present, no Murmur,  Respiratory: good respiratory effort, Bilateral Air entry equal and Decreased, no Crackles, no wheezes Abdomen: Bowel Sound present, Soft and no  tenderness,  Skin: no rashes Extremities: no Pedal edema, no calf tenderness Neurologic: without any new focal findings Gait not checked due to patient safety concerns  Vitals:   06/10/23 2048 06/11/23 0619 06/11/23 0748 06/11/23 1548  BP: (!) 165/100 (!) 143/95 (!) 142/92 (!) 156/92  Pulse: (!) 104 99 93 90   Resp: 18 18 18 17   Temp: 98.5 F (36.9 C)  98.6 F (37 C) 98.5 F (36.9 C)  TempSrc:   Oral   SpO2: 98% 96% 98% 97%  Weight:      Height:        Intake/Output Summary (Last 24 hours) at 06/11/2023 1549 Last data filed at 06/11/2023 1300 Gross per 24 hour  Intake 2099.37 ml  Output --  Net 2099.37 ml   Filed Weights   06/07/23 0737  Weight: 113.4 kg    Data Reviewed: I have personally reviewed and interpreted daily labs, tele strips, imagings as discussed above. I reviewed all nursing notes, pharmacy notes, vitals, pertinent old records I have discussed plan of care as described above with RN and patient/family.  CBC: Recent Labs  Lab 06/07/23 0543 06/08/23 0733  WBC 6.6 6.8  NEUTROABS 4.4  --   HGB 19.5* 15.8  HCT 58.6* 48.8  MCV 80.4 81.9  PLT 332 268   Basic Metabolic Panel: Recent Labs  Lab 06/07/23 0543 06/08/23 0733 06/09/23 1741 06/11/23 0404  NA 133* 134* 136 140  K 4.3 3.9 3.7 3.0*  CL 102 108 109 105  CO2 15* 19* 15* 19*  GLUCOSE 220* 147* 147* 143*  BUN 22* 15 14 9   CREATININE 1.41* 0.96 1.05 1.00  CALCIUM 9.9 8.6* 8.6* 9.3  MG  --   --   --  1.7  PHOS  --   --   --  4.0    Studies: No results found.  Scheduled Meds:  amLODipine  10 mg Oral QPM   aspirin EC  81 mg Oral QPM   dicyclomine  10 mg Oral TID AC   enoxaparin (LOVENOX) injection  40 mg Subcutaneous Q24H   icosapent Ethyl  2 g Oral BID   metoprolol succinate  50 mg Oral Daily   potassium chloride  40 mEq Oral Q4H   rosuvastatin  20 mg Oral QPM   saccharomyces boulardii  250 mg Oral BID   Continuous Infusions: PRN Meds: acetaminophen **OR** acetaminophen, hydrALAZINE, loperamide, morphine injection, ondansetron **OR** ondansetron (ZOFRAN) IV, oxyCODONE, simethicone  Time spent: 35 minutes  Author: Gillis Santa. MD [name] 06/11/2023 3:49 PM  To reach On-call, see care teams to locate the attending and reach out to them via [email or web address]. If 7PM-7AM, please  contact night-coverage If you still have difficulty reaching the attending provider, please page the [facility] (Director on Call) for [facility] on amion for assistance.

## 2023-06-11 NOTE — Patient Outreach (Signed)
Care Coordination   Follow Up Visit Note   06/11/2023 Name: GEN WOLFINGER MRN: [id number] DOB: Jan 01, 1976  DONTRE FASCIANO is a 47 y.o. year old male who sees [name], MD for primary care. I spoke with  [name] by phone today.  What matters to the patients health and wellness today?   Patient would like to recover from his acute GI symptoms without complications and or lengthy hospital stay.      Goals Addressed             This Visit's Progress    RN Care Coordination Activities: further follow up needed       Care Coordination Interventions: Received message from [facility] admin advising patient called to let me know he is in the hospital and would like a return call Placed successful outbound call to patient  Reviewed and discussed patient's current inpatient admission with admit date of 06/07/23 to [facility], dx: Acute kidney injury secondary to dehydration from N/V/D Determined patient feels his symptoms are not improving, he is discouraged and feels uncertain of his treatment plan  Review of patient status, including review of consultant's reports, relevant laboratory and other test results, and medications completed Encouraged patient to make a list of questions and or concerns to discuss with the rounding physician today Active listening / Reflection utilized  Emotional Support Provided Discussed plans with patient for ongoing care coordination follow up and provided patient with direct contact information for nurse care coordinator Sent secure chat to [place], [facility] population health nurse liaison regarding patient's admission and concerns voiced today      Interventions Today    Flowsheet Row Most Recent Value  Chronic Disease   Chronic disease during today's visit Other  [Acute kidney injury secondary to dehydration from N/V/D]  General Interventions   General Interventions Discussed/Reviewed General Interventions Reviewed, General  Interventions Discussed, Labs, Doctor Visits, Communication with  Doctor Visits Discussed/Reviewed Doctor Visits Reviewed, Doctor Visits Discussed, Specialist, PCP  Communication with RN  Charlesetta Shanks RN hospital liaison]  Education Interventions   Education Provided Provided Education  Provided Verbal Education On Mental Health/Coping with Illness, Labs, Medication  Mental Health Interventions   Mental Health Discussed/Reviewed Anxiety, Mental Health Discussed, Mental Health Reviewed  Pharmacy Interventions   Pharmacy Dicussed/Reviewed Pharmacy Topics Discussed, Pharmacy Topics Reviewed, Medications and their functions          SDOH assessments and interventions completed:  No     Care Coordination Interventions:  Yes, provided   Follow up plan: Follow up call scheduled for 06/14/23 @12 :30 PM    Encounter Outcome:  Patient Visit Completed

## 2023-06-11 NOTE — Patient Instructions (Signed)
Visit Information  Thank you for taking time to visit with me today. Please don't hesitate to contact me if I can be of assistance to you.   Following are the goals we discussed today:   Goals Addressed             This Visit's Progress    RN Care Coordination Activities: further follow up needed       Care Coordination Interventions: Received message from [facility] admin advising patient called to let me know he is in the hospital and would like a return call Placed successful outbound call to patient  Reviewed and discussed patient's current inpatient admission with admit date of 06/07/23 to [facility], dx: Acute kidney injury secondary to dehydration from N/V/D Determined patient feels his symptoms are not improving, he is discouraged and feels uncertain of his treatment plan  Review of patient status, including review of consultant's reports, relevant laboratory and other test results, and medications completed Encouraged patient to make a list of questions and or concerns to discuss with the rounding physician today Active listening / Reflection utilized  Emotional Support Provided Discussed plans with patient for ongoing care coordination follow up and provided patient with direct contact information for nurse care coordinator Sent secure chat to Charlesetta Shanks, [facility] population health nurse liaison regarding patient's admission and concerns voiced today         Our next appointment is by telephone on 06/14/23 at 12:30 PM  Please call the care guide team at [phone] if you need to cancel or reschedule your appointment.   If you are experiencing a Mental Health or Behavioral Health Crisis or need someone to talk to, please call 1-800-273-TALK (toll free, 24 hour hotline)  Patient verbalizes understanding of instructions and care plan provided today and agrees to view in MyChart. Active MyChart status and patient understanding of how to access instructions and  care plan via MyChart confirmed with patient.     Delsa Sale RN BSN CCM [place]  [facility], [facility] Nurse Care Coordinator  Direct Dial: [phone] Website: [email or web address]

## 2023-06-11 NOTE — Progress Notes (Signed)
   06/11/23 2036  BiPAP/CPAP/SIPAP  BiPAP/CPAP/SIPAP Pt Type Adult  BiPAP/CPAP/SIPAP Resmed    Pt has home cpap machine and doesn't need assistance

## 2023-06-11 NOTE — Plan of Care (Signed)

## 2023-06-11 NOTE — Consult Note (Signed)
Value-Based Care Institute  [facility] Inpatient Consult   06/11/2023  Richard Hull July 02, 1976 [id number]  [facility] [[facility]]  Accountable Care Organization [ACO] Patient: Richard Hull [facility]  Referral received from [facility] alerting of active patient hospitalized.   Primary Care Provider:  [name], MD with [facility] which is listed to provide the transition of care follow up.  Patient is showing as admission x1 in 6 months and low risk for unplanned readmission. Met with patient at bedside to follow up on care needs and assistance.  Patient states he is feeling some better with holding diet and having less diarrhea.We talked about symptom and managing his needs.  Spoke with patient about and how he could potential benefit from a Nutritional/Dietary consult with medications and managing diabetes.  Patient is currently active with [facility] Care Management for care coordination services.  Patient has been engaged by a [employer]  The community based plan of care has focused on disease management and community resource support.    Patient will receive a post hospital call and will be evaluated for assessments and disease process education.    Plan: Continue to follow for needs. Update Nurse, children's CC.   Of note, [facility] Care Management services does not replace or interfere with any services that are needed or arranged by inpatient [facility] care management team.   Charlesetta Shanks, RN, BSN, CCM [place]  [facility], [facility] Liaison Direct Dial: [phone] or secure chat Website: [email or web address]

## 2023-06-12 DIAGNOSIS — N179 Acute kidney failure, unspecified: Secondary | ICD-10-CM | POA: Diagnosis not present

## 2023-06-12 LAB — BASIC METABOLIC PANEL
Anion gap: 9 (ref 5–15)
BUN: 7 mg/dL (ref 6–20)
CO2: 21 mmol/L — ABNORMAL LOW (ref 22–32)
Calcium: 8.3 mg/dL — ABNORMAL LOW (ref 8.9–10.3)
Chloride: 107 mmol/L (ref 98–111)
Creatinine, Ser: 1.02 mg/dL (ref 0.61–1.24)
GFR, Estimated: >60 mL/min (ref >60)
Glucose, Bld: 123 mg/dL — ABNORMAL HIGH (ref 70–99)
Potassium: 3.4 mmol/L — ABNORMAL LOW (ref 3.5–5.1)
Sodium: 137 mmol/L (ref 135–145)

## 2023-06-12 LAB — HEPATITIS PANEL, ACUTE
HCV Ab: NONREACTIVE
Hep A IgM: NONREACTIVE
Hep B C IgM: NONREACTIVE
Hepatitis B Surface Ag: NONREACTIVE

## 2023-06-12 LAB — HEPATIC FUNCTION PANEL
ALT: 79 U/L — ABNORMAL HIGH (ref 0–44)
AST: 70 U/L — ABNORMAL HIGH (ref 15–41)
Albumin: 3 g/dL — ABNORMAL LOW (ref 3.5–5.0)
Alkaline Phosphatase: 67 U/L (ref 38–126)
Bilirubin, Direct: 0.1 mg/dL (ref 0.0–0.2)
Indirect Bilirubin: 0.4 mg/dL (ref 0.3–0.9)
Total Bilirubin: 0.5 mg/dL (ref 0.3–1.2)
Total Protein: 5.6 g/dL — ABNORMAL LOW (ref 6.5–8.1)

## 2023-06-12 LAB — LIPASE, BLOOD: Lipase: 29 U/L (ref 11–51)

## 2023-06-12 LAB — CBC
HCT: 44.8 % (ref 39.0–52.0)
Hemoglobin: 15.3 g/dL (ref 13.0–17.0)
MCH: 27.3 pg (ref 26.0–34.0)
MCHC: 34.2 g/dL (ref 30.0–36.0)
MCV: 79.9 fL — ABNORMAL LOW (ref 80.0–100.0)
Platelets: 214 10*3/uL (ref 150–400)
RBC: 5.61 MIL/uL (ref 4.22–5.81)
RDW: 13.8 % (ref 11.5–15.5)
WBC: 12.3 10*3/uL — ABNORMAL HIGH (ref 4.0–10.5)
nRBC: 0 % (ref 0.0–0.2)

## 2023-06-12 LAB — RETICULIN ANTIBODIES, IGA W TITER: Reticulin Ab, IgA: NEGATIVE {titer} (ref <2.5)

## 2023-06-12 LAB — PHOSPHORUS: Phosphorus: 3.7 mg/dL (ref 2.5–4.6)

## 2023-06-12 LAB — MAGNESIUM: Magnesium: 1.8 mg/dL (ref 1.7–2.4)

## 2023-06-12 MED ORDER — PANCRELIPASE (LIP-PROT-AMYL) 24000-76000 UNITS PO CPEP: 24000.0000 [IU] | ORAL_CAPSULE | Freq: Three times a day (TID) | ORAL | 0 refills | Status: AC

## 2023-06-12 MED ORDER — POTASSIUM CHLORIDE CRYS ER 20 MEQ PO TBCR
40.0000 meq | EXTENDED_RELEASE_TABLET | Freq: Once | ORAL | Status: AC
  Administered 2023-06-12: 40 meq via ORAL
  Filled 2023-06-12: qty 2

## 2023-06-12 MED ORDER — SACCHAROMYCES BOULARDII 250 MG PO CAPS: 250.0000 mg | ORAL_CAPSULE | Freq: Two times a day (BID) | ORAL | 0 refills | Status: AC

## 2023-06-12 NOTE — TOC Transition Note (Signed)
Transition of Care [facility]) - CM/SW Discharge Note   Patient Details  Name: Richard Hull MRN: [id number] Date of Birth: 09-03-1975  Transition of Care [facility]) CM/SW Contact:  Tom-Johnson, Hershal Coria, RN Phone Number: 06/12/2023, 11:24 AM   Clinical Narrative:     Patient is scheduled for discharge today.  Readmission Risk Assessment done. Hospital f/u and discharge instructions on AVS. No TOC needs or recommendations noted. Wife, Cala Bradford to transport at discharge.  No further TOC needs noted.         Final next level of care: Home/Self Care Barriers to Discharge: Barriers Resolved   Patient Goals and CMS Choice CMS Medicare.gov Compare Post Acute Care list provided to:: Patient    Discharge Placement                  Patient to be transferred to facility by: Wife Name of family member notified: [facility]    Discharge Plan and Services Additional resources added to the After Visit Summary for                  DME Arranged: N/A DME Agency: NA       HH Arranged: NA HH Agency: NA        Social Determinants of Health (SDOH) Interventions SDOH Screenings   Food Insecurity: No Food Insecurity (06/07/2023)  Housing: Low Risk  (06/07/2023)  Transportation Needs: No Transportation Needs (06/07/2023)  Utilities: Not At Risk (06/07/2023)  Tobacco Use: Low Risk  (06/07/2023)     Readmission Risk Interventions    06/10/2023    1:27 PM  Readmission Risk Prevention Plan  Post Dischage Appt Complete  Medication Screening Complete  Transportation Screening Complete

## 2023-06-12 NOTE — Discharge Summary (Signed)
Physician Discharge Summary  IVER MELLEY KZS:[id number] DOB: Sep 14, 1975 DOA: 06/07/2023  PCP: [name], MD  Admit date: 06/07/2023 Discharge date: 06/12/2023  Admitted From: Home Disposition: Home  Recommendations for Outpatient Follow-up:  Follow up with PCP in 1-2 weeks Please obtain CMP/CBC in one week your next doctors visit.  Patient request Creon and Florastor which help him make feel better Discontinue Mounjaro.  He would like to speak with his outpatient provider regarding any other alternative options Advised to follow-up patient PCP and periodic CMP for hepatic steatosis   Discharge Condition: Stable CODE STATUS: Full code Diet recommendation: Heart the  Brief/Interim Summary: Brief hospital course: OMRAN NOHR is a 47 y.o. male with medical history significant of  hypertension, CAD, dyslipidemia, OCD, OSA and nephrolithiasis who presented to ED with complaints of 3-4 days of nausea/vomiting, diarrhea and abdominal pain with poor PO intake. Wednesday morning he woke up with violent vomiting, diarrhea and foul smelling burps.  Upon admission patient was found to be in acute kidney injury which improved with IV fluids.  GI workup is negative.  CT scan unremarkable besides nonobstructive nephrolithiasis.  Electrolytes were repleted during hospitalization. Today he is feeling significantly well and would like to go home   Assessment and Plan:   Acute kidney injury secondary to dehydration from N/V/D. Received IV fluids and bicarb infusion upon admission.  Baseline creatinine 1.0, admission creatinine 1.4 which is now resolved.   Nausea vomiting and diarrhea; resolved.  -Suspect secondary to GLP-1 drug.  In the past extensive workup has been negative.  Discontinue Mounjaro for now   Hypokalemia Replete as needed  Left-sided nephrolithiasis, nonobstructive -Been chronic per patient.  Encourage oral hydration, if necessary he can be placed on Flomax but avoiding it  right now due to signs of dehydration   Metabolic acidosis, Non anion gap Resolved with fluids.     Transaminitis Overall appears to be steady. Has been following PCP for the past 2 months for it.    Polycythemia Initial hemoconcentrated, now stable at 15   CAD (coronary artery disease) Followed by cardiology for MI with stenting in RCA in 2017 Continue medical therapy . No chest pain or sob.  Continue aspirin and statin   HTN (hypertension) Continue Norvasc, Toprol-XL.     Hyperlipidemia LDL goal <70 Continue crestor 20mg     OSA on CPAP Continue cpap at night        Body mass index is 36.92 kg/m.  Interventions:   Diet: Regular diet DVT Prophylaxis: Subcutaneous Lovenox    Advance goals of care discussion: Full code   Family Communication: None at bedside Discharge today      Discharge Diagnoses:  Principal Problem:   Acute kidney injury secondary to dehydration from N/V/D. Active Problems:   Dehydration   Diarrhea   Metabolic acidosis, increased anion gap   Transaminitis   Polycythemia   CAD (coronary artery disease)   HTN (hypertension)   Hyperlipidemia LDL goal <70   OSA on CPAP      Consultations: None  Subjective: Feels back to baseline, no complaints.  Wishes to go home.  Discharge Exam: Vitals:   06/12/23 0500 06/12/23 0831  BP: 123/80 138/82  Pulse: 80 86  Resp: 18 18  Temp: 97.7 F (36.5 C) 98 F (36.7 C)  SpO2: 99% 97%   Vitals:   06/11/23 1548 06/11/23 2001 06/12/23 0500 06/12/23 0831  BP: (!) 156/92 (!) 140/88 123/80 138/82  Pulse: 90 79 80 86  Resp:  17  18 18   Temp: 98.5 F (36.9 C) 98 F (36.7 C) 97.7 F (36.5 C) 98 F (36.7 C)  TempSrc:   Oral   SpO2: 97%  99% 97%  Weight:      Height:        General: Pt is alert, awake, not in acute distress Cardiovascular: RRR, S1/S2 +, no rubs, no gallops Respiratory: CTA bilaterally, no wheezing, no rhonchi Abdominal: Soft, NT, ND, bowel sounds + Extremities: no  edema, no cyanosis  Discharge Instructions   Allergies as of 06/12/2023       Reactions   Benicar [olmesartan] Other (See Comments)   Metallic taste   Coreg [carvedilol] Other (See Comments)   Severe fatigue   Mounjaro [tirzepatide] Diarrhea, Nausea And Vomiting, Other (See Comments)   Abdominal pain   Ozempic (0.25 Or 0.5 Mg-dose) [semaglutide(0.25 Or 0.5mg -dos)] Diarrhea, Nausea And Vomiting, Other (See Comments)   Increased pancreatic enzymes Abdominal pain   Amoxil [amoxicillin] Rash   Penicillins Rash, Other (See Comments)        Medication List     STOP taking these medications    Mounjaro 2.5 MG/0.5ML Pen Generic drug: tirzepatide       TAKE these medications    amLODipine 10 MG tablet Commonly known as: NORVASC Take 1 tablet by mouth every evening.   aspirin EC 81 MG tablet Take 1 tablet (81 mg total) by mouth daily. What changed: when to take this   diclofenac 75 MG EC tablet Commonly known as: VOLTAREN Take 75 mg by mouth 2 (two) times daily as needed for moderate pain (pain score 4-6).   icosapent Ethyl 1 g capsule Commonly known as: VASCEPA TAKE 2 CAPSULES BY MOUTH 2 TIMES DAILY.   metoprolol succinate 50 MG 24 hr tablet Commonly known as: TOPROL-XL TAKE 1 TABLET BY MOUTH DAILY. TAKE WITH OR IMMEDIATELY FOLLOWING A MEAL.   naproxen sodium 220 MG tablet Commonly known as: ALEVE Take 660 mg by mouth daily as needed (pain).   nitroGLYCERIN 0.4 MG SL tablet Commonly known as: NITROSTAT Place 1 tablet (0.4 mg total) under the tongue every 5 (five) minutes as needed for chest pain (CP or SOB).   Pancrelipase (Lip-Prot-Amyl) 24000-76000 units Cpep Take 1 capsule (24,000 Units total) by mouth 3 (three) times daily before meals.   Repatha SureClick 140 MG/ML Soaj Generic drug: Evolocumab Inject 140 mg into the skin every 14 (fourteen) days.   rosuvastatin 20 MG tablet Commonly known as: CRESTOR TAKE 1 TABLET BY MOUTH EVERY DAY What changed:  when to take this   saccharomyces boulardii 250 MG capsule Commonly known as: FLORASTOR Take 1 capsule (250 mg total) by mouth 2 (two) times daily for 14 days.   valsartan 160 MG tablet Commonly known as: DIOVAN TAKE 1 TABLET BY MOUTH EVERY DAY What changed: when to take this   Vitamin D3 1.25 MG (50000 UT) Caps Take 1.25 mg by mouth every Friday.        Allergies  Allergen Reactions   Benicar [Olmesartan] Other (See Comments)    Metallic taste   Coreg [Carvedilol] Other (See Comments)    Severe fatigue   Mounjaro [Tirzepatide] Diarrhea, Nausea And Vomiting and Other (See Comments)    Abdominal pain   Ozempic (0.25 Or 0.5 Mg-Dose) [Semaglutide(0.25 Or 0.5mg -Dos)] Diarrhea, Nausea And Vomiting and Other (See Comments)    Increased pancreatic enzymes Abdominal pain   Amoxil [Amoxicillin] Rash   Penicillins Rash and Other (See Comments)  You were cared for by a hospitalist during your hospital stay. If you have any questions about your discharge medications or the care you received while you were in the hospital after you are discharged, you can call the unit and asked to speak with the hospitalist on call if the hospitalist that took care of you is not available. Once you are discharged, your primary care physician will handle any further medical issues. Please note that no refills for any discharge medications will be authorized once you are discharged, as it is imperative that you return to your primary care physician (or establish a relationship with a primary care physician if you do not have one) for your aftercare needs so that they can reassess your need for medications and monitor your lab values.  You were cared for by a hospitalist during your hospital stay. If you have any questions about your discharge medications or the care you received while you were in the hospital after you are discharged, you can call the unit and asked to speak with the hospitalist on call if the  hospitalist that took care of you is not available. Once you are discharged, your primary care physician will handle any further medical issues. Please note that NO REFILLS for any discharge medications will be authorized once you are discharged, as it is imperative that you return to your primary care physician (or establish a relationship with a primary care physician if you do not have one) for your aftercare needs so that they can reassess your need for medications and monitor your lab values.  Please request your Prim.MD to go over all Hospital Tests and Procedure/Radiological results at the follow up, please get all Hospital records sent to your Prim MD by signing hospital release before you go home.  Get CBC, CMP, 2 view Chest X ray checked  by Primary MD during your next visit or SNF MD in 5-7 days ( we routinely change or add medications that can affect your baseline labs and fluid status, therefore we recommend that you get the mentioned basic workup next visit with your PCP, your PCP may decide not to get them or add new tests based on their clinical decision)  On your next visit with your primary care physician please Get Medicines reviewed and adjusted.  If you experience worsening of your admission symptoms, develop shortness of breath, life threatening emergency, suicidal or homicidal thoughts you must seek medical attention immediately by calling 911 or calling your MD immediately  if symptoms less severe.  You Must read complete instructions/literature along with all the possible adverse reactions/side effects for all the Medicines you take and that have been prescribed to you. Take any new Medicines after you have completely understood and accpet all the possible adverse reactions/side effects.   Do not drive, operate heavy machinery, perform activities at heights, swimming or participation in water activities or provide baby sitting services if your were admitted for syncope or  siezures until you have seen by Primary MD or a Neurologist and advised to do so again.  Do not drive when taking Pain medications.   Procedures/Studies: CT ABDOMEN PELVIS W CONTRAST  Result Date: 06/07/2023 CLINICAL DATA:  Acute generalized abdominal pain. EXAM: CT ABDOMEN AND PELVIS WITH CONTRAST TECHNIQUE: Multidetector CT imaging of the abdomen and pelvis was performed using the standard protocol following bolus administration of intravenous contrast. RADIATION DOSE REDUCTION: This exam was performed according to the departmental dose-optimization program which includes automated exposure control,  adjustment of the mA and/or kV according to patient size and/or use of iterative reconstruction technique. CONTRAST:  75mL OMNIPAQUE IOHEXOL 350 MG/ML SOLN COMPARISON:  September 03, 2022. FINDINGS: Lower chest: No acute abnormality. Hepatobiliary: Hepatic steatosis. No cholelithiasis or biliary dilatation. Pancreas: Unremarkable. No pancreatic ductal dilatation or surrounding inflammatory changes. Spleen: Normal in size without focal abnormality. Adrenals/Urinary Tract: Adrenal glands appear normal. Small nonobstructive left renal calculus. No hydronephrosis or renal obstruction is noted. Urinary bladder is unremarkable. Stomach/Bowel: Stomach is within normal limits. Appendix appears normal. No evidence of bowel wall thickening, distention, or inflammatory changes. Vascular/Lymphatic: No significant vascular findings are present. No enlarged abdominal or pelvic lymph nodes. Reproductive: Prostate is unremarkable. Other: No abdominal wall hernia or abnormality. No abdominopelvic ascites. Musculoskeletal: No acute or significant osseous findings. IMPRESSION: Hepatic steatosis. Nonobstructive left nephrolithiasis. Electronically Signed   By: Lupita Raider M.D.   On: 06/07/2023 09:52     The results of significant diagnostics from this hospitalization (including imaging, microbiology, ancillary and  laboratory) are listed below for reference.     Microbiology: Recent Results (from the past 240 hour(s))  C Difficile Quick Screen w PCR reflex     Status: None   Collection Time: 06/07/23  4:59 PM   Specimen: STOOL  Result Value Ref Range Status   C Diff antigen NEGATIVE NEGATIVE Final   C Diff toxin NEGATIVE NEGATIVE Final   C Diff interpretation No C. difficile detected.  Final    Comment: Performed at [facility], [address], [place], [place] [address]  Gastrointestinal Panel by PCR , Stool     Status: None   Collection Time: 06/07/23  4:59 PM   Specimen: STOOL  Result Value Ref Range Status   Campylobacter species NOT DETECTED NOT DETECTED Final   Plesimonas shigelloides NOT DETECTED NOT DETECTED Final   Salmonella species NOT DETECTED NOT DETECTED Final   Yersinia enterocolitica NOT DETECTED NOT DETECTED Final   Vibrio species NOT DETECTED NOT DETECTED Final   Vibrio cholerae NOT DETECTED NOT DETECTED Final   Enteroaggregative E coli (EAEC) NOT DETECTED NOT DETECTED Final   Enteropathogenic E coli (EPEC) NOT DETECTED NOT DETECTED Final   Enterotoxigenic E coli (ETEC) NOT DETECTED NOT DETECTED Final   Shiga like toxin producing E coli (STEC) NOT DETECTED NOT DETECTED Final   Shigella/Enteroinvasive E coli (EIEC) NOT DETECTED NOT DETECTED Final   Cryptosporidium NOT DETECTED NOT DETECTED Final   Cyclospora cayetanensis NOT DETECTED NOT DETECTED Final   Entamoeba histolytica NOT DETECTED NOT DETECTED Final   Giardia lamblia NOT DETECTED NOT DETECTED Final   Adenovirus F40/41 NOT DETECTED NOT DETECTED Final   Astrovirus NOT DETECTED NOT DETECTED Final   Norovirus GI/GII NOT DETECTED NOT DETECTED Final   Rotavirus A NOT DETECTED NOT DETECTED Final   Sapovirus (I, II, IV, and V) NOT DETECTED NOT DETECTED Final    Comment: Performed at [facility], [address] [place], [place] [address]     Labs: BNP (last 3 results) No results for input(s): "BNP" in  the last 8760 hours. Basic Metabolic Panel: Recent Labs  Lab 06/07/23 0543 06/08/23 0733 06/09/23 1741 06/11/23 0404 06/12/23 0419  NA 133* 134* 136 140 137  K 4.3 3.9 3.7 3.0* 3.4*  CL 102 108 109 105 107  CO2 15* 19* 15* 19* 21*  GLUCOSE 220* 147* 147* 143* 123*  BUN 22* 15 14 9 7   CREATININE 1.41* 0.96 1.05 1.00 1.02  CALCIUM 9.9  8.6* 8.6* 9.3 8.3*  MG  --   --   --  1.7 1.8  PHOS  --   --   --  4.0 3.7   Liver Function Tests: Recent Labs  Lab 06/07/23 0543 06/09/23 1741 06/12/23 0419  AST 82* 42* 70*  ALT 125* 78* 79*  ALKPHOS 93 79 67  BILITOT 1.2 0.5 0.5  PROT 7.6 6.2* 5.6*  ALBUMIN 4.1 3.3* 3.0*   Recent Labs  Lab 06/07/23 0543 06/12/23 0419  LIPASE 22 29   No results for input(s): "AMMONIA" in the last 168 hours. CBC: Recent Labs  Lab 06/07/23 0543 06/08/23 0733 06/12/23 0419  WBC 6.6 6.8 12.3*  NEUTROABS 4.4  --   --   HGB 19.5* 15.8 15.3  HCT 58.6* 48.8 44.8  MCV 80.4 81.9 79.9*  PLT 332 268 214   Cardiac Enzymes: No results for input(s): "CKTOTAL", "CKMB", "CKMBINDEX", "TROPONINI" in the last 168 hours. BNP: Invalid input(s): "POCBNP" CBG: No results for input(s): "GLUCAP" in the last 168 hours. D-Dimer No results for input(s): "DDIMER" in the last 72 hours. Hgb A1c No results for input(s): "HGBA1C" in the last 72 hours. Lipid Profile No results for input(s): "CHOL", "HDL", "LDLCALC", "TRIG", "CHOLHDL", "LDLDIRECT" in the last 72 hours. Thyroid function studies No results for input(s): "TSH", "T4TOTAL", "T3FREE", "THYROIDAB" in the last 72 hours.  Invalid input(s): "FREET3" Anemia work up No results for input(s): "VITAMINB12", "FOLATE", "FERRITIN", "TIBC", "IRON", "RETICCTPCT" in the last 72 hours. Urinalysis    Component Value Date/Time   COLORURINE AMBER (A) 06/07/2023 0633   APPEARANCEUR HAZY (A) 06/07/2023 0633   LABSPEC 1.032 (H) 06/07/2023 0633   PHURINE 5.0 06/07/2023 0633   GLUCOSEU 50 (A) 06/07/2023 0633   HGBUR  NEGATIVE 06/07/2023 0633   BILIRUBINUR SMALL (A) 06/07/2023 0633   KETONESUR 5 (A) 06/07/2023 0633   PROTEINUR 100 (A) 06/07/2023 0633   UROBILINOGEN 0.2 03/08/2014 2117   NITRITE NEGATIVE 06/07/2023 0633   LEUKOCYTESUR NEGATIVE 06/07/2023 0633   Sepsis Labs Recent Labs  Lab 06/07/23 0543 06/08/23 0733 06/12/23 0419  WBC 6.6 6.8 12.3*   Microbiology Recent Results (from the past 240 hour(s))  C Difficile Quick Screen w PCR reflex     Status: None   Collection Time: 06/07/23  4:59 PM   Specimen: STOOL  Result Value Ref Range Status   C Diff antigen NEGATIVE NEGATIVE Final   C Diff toxin NEGATIVE NEGATIVE Final   C Diff interpretation No C. difficile detected.  Final    Comment: Performed at [facility], [address] [place], [place] [address]  Gastrointestinal Panel by PCR , Stool     Status: None   Collection Time: 06/07/23  4:59 PM   Specimen: STOOL  Result Value Ref Range Status   Campylobacter species NOT DETECTED NOT DETECTED Final   Plesimonas shigelloides NOT DETECTED NOT DETECTED Final   Salmonella species NOT DETECTED NOT DETECTED Final   Yersinia enterocolitica NOT DETECTED NOT DETECTED Final   Vibrio species NOT DETECTED NOT DETECTED Final   Vibrio cholerae NOT DETECTED NOT DETECTED Final   Enteroaggregative E coli (EAEC) NOT DETECTED NOT DETECTED Final   Enteropathogenic E coli (EPEC) NOT DETECTED NOT DETECTED Final   Enterotoxigenic E coli (ETEC) NOT DETECTED NOT DETECTED Final   Shiga like toxin producing E coli (STEC) NOT DETECTED NOT DETECTED Final   Shigella/Enteroinvasive E coli (EIEC) NOT DETECTED NOT DETECTED Final   Cryptosporidium NOT DETECTED NOT DETECTED Final  Cyclospora cayetanensis NOT DETECTED NOT DETECTED Final   Entamoeba histolytica NOT DETECTED NOT DETECTED Final   Giardia lamblia NOT DETECTED NOT DETECTED Final   Adenovirus F40/41 NOT DETECTED NOT DETECTED Final   Astrovirus NOT DETECTED NOT DETECTED Final   Norovirus GI/GII  NOT DETECTED NOT DETECTED Final   Rotavirus A NOT DETECTED NOT DETECTED Final   Sapovirus (I, II, IV, and V) NOT DETECTED NOT DETECTED Final    Comment: Performed at [facility], [address], [place], [place] [address]     Time coordinating discharge:  I have spent 35 minutes face to face with the patient and on the ward discussing the patients care, assessment, plan and disposition with other care givers. >50% of the time was devoted counseling the patient about the risks and benefits of treatment/Discharge disposition and coordinating care.   SIGNED:   Miguel Rota, MD  [facility] 06/12/2023, 11:52 AM   If 7PM-7AM, please contact night-coverage

## 2023-06-12 NOTE — Plan of Care (Signed)

## 2023-06-12 NOTE — Hospital Course (Addendum)
Brief hospital course: Richard Hull is a 47 y.o. male with medical history significant of  hypertension, CAD, dyslipidemia, OCD, OSA and nephrolithiasis who presented to ED with complaints of 3-4 days of nausea/vomiting, diarrhea and abdominal pain with poor PO intake. Wednesday morning he woke up with violent vomiting, diarrhea and foul smelling burps.  Upon admission patient was found to be in acute kidney injury which improved with IV fluids.  GI workup is negative.  CT scan unremarkable besides nonobstructive nephrolithiasis.  Electrolytes were repleted during hospitalization. Today he is feeling significantly well and would like to go home   Assessment and Plan:   Acute kidney injury secondary to dehydration from N/V/D. Received IV fluids and bicarb infusion upon admission.  Baseline creatinine 1.0, admission creatinine 1.4 which is now resolved.   Nausea vomiting and diarrhea; resolved.  -Suspect secondary to GLP-1 drug.  In the past extensive workup has been negative.  Discontinue Mounjaro for now   Hypokalemia Replete as needed  Left-sided nephrolithiasis, nonobstructive -Been chronic per patient.  Encourage oral hydration, if necessary he can be placed on Flomax but avoiding it right now due to signs of dehydration   Metabolic acidosis, Non anion gap Resolved with fluids.     Transaminitis Overall appears to be steady. Has been following PCP for the past 2 months for it.    Polycythemia Initial hemoconcentrated, now stable at 15   CAD (coronary artery disease) Followed by cardiology for MI with stenting in RCA in 2017 Continue medical therapy . No chest pain or sob.  Continue aspirin and statin   HTN (hypertension) Continue Norvasc, Toprol-XL.     Hyperlipidemia LDL goal <70 Continue crestor 20mg     OSA on CPAP Continue cpap at night        Body mass index is 36.92 kg/m.  Interventions:   Diet: Regular diet DVT Prophylaxis: Subcutaneous Lovenox     Advance goals of care discussion: Full code   Family Communication: None at bedside Discharge today

## 2023-06-14 ENCOUNTER — Ambulatory Visit: Payer: Self-pay

## 2023-06-14 NOTE — Patient Outreach (Signed)
Care Coordination   06/14/2023 Name: Richard Hull MRN: [id number] DOB: [birth year]   Care Coordination Outreach Attempts:  An unsuccessful telephone outreach was attempted for a scheduled appointment today.  Follow Up Plan:  Additional outreach attempts will be made to offer the patient care coordination information and services.   Encounter Outcome:  No Answer   Care Coordination Interventions:  No, not indicated    Delsa Sale RN BSN CCM   [facility], [facility] Health Nurse Care Coordinator  Direct Dial: [phone] Website: [email or web address]

## 2023-06-23 DIAGNOSIS — G4733 Obstructive sleep apnea (adult) (pediatric): Secondary | ICD-10-CM | POA: Diagnosis not present

## 2023-06-27 DIAGNOSIS — N179 Acute kidney failure, unspecified: Secondary | ICD-10-CM | POA: Diagnosis not present

## 2023-06-27 DIAGNOSIS — J0100 Acute maxillary sinusitis, unspecified: Secondary | ICD-10-CM | POA: Diagnosis not present

## 2023-06-27 DIAGNOSIS — Z1389 Encounter for screening for other disorder: Secondary | ICD-10-CM | POA: Diagnosis not present

## 2023-06-27 DIAGNOSIS — M545 Low back pain, unspecified: Secondary | ICD-10-CM | POA: Diagnosis not present

## 2023-06-27 DIAGNOSIS — Z0000 Encounter for general adult medical examination without abnormal findings: Secondary | ICD-10-CM | POA: Diagnosis not present

## 2023-07-11 ENCOUNTER — Ambulatory Visit: Payer: Self-pay

## 2023-07-12 NOTE — Patient Outreach (Signed)
  Care Coordination   Follow Up Visit Note   07/11/2023 Name: Richard Hull MRN: [id number] DOB: [birth year]  Richard Hull is a 47 y.o. year old male who sees [name], MD for primary care. I spoke with  [name] by phone today.  What matters to the patients health and wellness today?  Patient would like to get his blood sugars under better control.     Goals Addressed             This Visit's Progress    COMPLETED: RN Care Coordination Activities: further follow up needed       Care Coordination Interventions: Placed successful outbound call to patient  Determined patient was discharged home following an inpatient admission for acute pancreatitis secondary to taking a GLP-1 Review of patient status, including review of consultant's reports, relevant laboratory and other test results, and medications completed Determined patient continues to have some elevated blood sugars, he is working with his PCP to manage this condition (See Goal)     To get blood sugars under control       Care Coordination Interventions: Provided education to patient about basic DM disease process Reviewed medications with patient and discussed importance of medication adherence Provided patient with written educational materials related to hypo and hyperglycemia and importance of correct treatment Advised patient, providing education and rationale, to check cbg daily before meals and at bedtime and record, calling PCP for findings outside established parameters Review of patient status, including review of consultants reports, relevant laboratory and other test results, and medications completed Sent in basket message to Ronal Fear CDE requesting assistance to patient for obtaining a Dexcom sensor  Patient will continue to check his blood sugars at home and adhere to his prescribed treatment plan as directed Patient will check his cbg daily before meals and at bedtime and or if symptomatic of  hypo/hyperglycemia Patient will call his PCP for findings outside the established parameters Patient will work with the diabetic educator with PCP office to help improve diabetes management  Patient will work with the nurse care coordinator for chronic disease management and care coordination needs      Interventions Today    Flowsheet Row Most Recent Value  Chronic Disease   Chronic disease during today's visit Diabetes  General Interventions   General Interventions Discussed/Reviewed General Interventions Discussed, General Interventions Reviewed, Labs, Doctor Visits, Durable Medical Equipment (DME), Communication with  Doctor Visits Discussed/Reviewed Doctor Visits Reviewed, Doctor Visits Discussed, PCP  Durable Medical Equipment (DME) Glucomoter  Communication with RN, Social Work  Ronal Fear CDE,  Remigio Eisenmenger BSW]  Education Interventions   Education Provided Provided Education  Provided Verbal Education On When to see the doctor, Medication, Blood Sugar Monitoring  Pharmacy Interventions   Pharmacy Dicussed/Reviewed Pharmacy Topics Reviewed, Pharmacy Topics Discussed, Medications and their functions          SDOH assessments and interventions completed:  No     Care Coordination Interventions:  Yes, provided   Follow up plan: Follow up call scheduled for 08/08/23 @2 :00 PM    Encounter Outcome:  Patient Visit Completed

## 2023-07-12 NOTE — Patient Instructions (Signed)
Visit Information  Thank you for taking time to visit with me today. Please don't hesitate to contact me if I can be of assistance to you.   Following are the goals we discussed today:   Goals Addressed             This Visit's Progress    COMPLETED: RN Care Coordination Activities: further follow up needed       Care Coordination Interventions: Placed successful outbound call to patient  Determined patient was discharged home following an inpatient admission for acute pancreatitis secondary to taking a GLP-1 Review of patient status, including review of consultant's reports, relevant laboratory and other test results, and medications completed Determined patient continues to have some elevated blood sugars, he is working with his PCP to manage this condition (See Goal)     To get blood sugars under control       Care Coordination Interventions: Provided education to patient about basic DM disease process Reviewed medications with patient and discussed importance of medication adherence Provided patient with written educational materials related to hypo and hyperglycemia and importance of correct treatment Advised patient, providing education and rationale, to check cbg daily before meals and at bedtime and record, calling PCP for findings outside established parameters Review of patient status, including review of consultants reports, relevant laboratory and other test results, and medications completed Sent in basket message to Ronal Fear CDE requesting assistance to patient for obtaining a Dexcom sensor  Patient will continue to check his blood sugars at home and adhere to his prescribed treatment plan as directed Patient will check his cbg daily before meals and at bedtime and or if symptomatic of hypo/hyperglycemia Patient will call his PCP for findings outside the established parameters Patient will work with the diabetic educator with PCP office to help improve diabetes  management  Patient will work with the nurse care coordinator for chronic disease management and care coordination needs          Our next appointment is by telephone on 08/08/23 at 2:00 PM   Please call the care guide team at [phone] if you need to cancel or reschedule your appointment.   If you are experiencing a Mental Health or Behavioral Health Crisis or need someone to talk to, please call 1-800-273-TALK (toll free, 24 hour hotline)  Patient verbalizes understanding of instructions and care plan provided today and agrees to view in MyChart. Active MyChart status and patient understanding of how to access instructions and care plan via MyChart confirmed with patient.     Delsa Sale RN BSN CCM [place]  [facility], [facility] Health Nurse Care Coordinator  Direct Dial: [phone] Website: [email or web address]

## 2023-07-22 DIAGNOSIS — G4733 Obstructive sleep apnea (adult) (pediatric): Secondary | ICD-10-CM | POA: Diagnosis not present

## 2023-07-23 ENCOUNTER — Ambulatory Visit: Payer: Self-pay | Admitting: Licensed Clinical Social Worker

## 2023-07-23 DIAGNOSIS — R7989 Other specified abnormal findings of blood chemistry: Secondary | ICD-10-CM | POA: Diagnosis not present

## 2023-07-23 DIAGNOSIS — E119 Type 2 diabetes mellitus without complications: Secondary | ICD-10-CM | POA: Diagnosis not present

## 2023-07-23 NOTE — Patient Instructions (Signed)
Visit Information  Thank you for taking time to visit with me today. Please don't hesitate to contact me if I can be of assistance to you.   Following are the goals we discussed today:   Goals Addressed             This Visit's Progress    Care Coordination Activities       Care Coordination Interventions: Patient stated that he received a bill from the hospital for a 08/2022 admission for $19,000 and the isurance declined to pay, SW discussed about calling the billing division at [facility] to see if the bill can be re filed. SW will follow up with the patient on  08/08/2023 at 10:30 am.        Our next appointment is by telephone on 08/08/2023 at 10:30 am  Please call the care guide team at [phone] if you need to cancel or reschedule your appointment.   If you are experiencing a Mental Health or Behavioral Health Crisis or need someone to talk to, please call the Suicide and Crisis Lifeline: 988 go to [facility] Urgent [facility] [address], [place] [id number]) call 911  Patient verbalizes understanding of instructions and care plan provided today and agrees to view in MyChart. Active MyChart status and patient understanding of how to access instructions and care plan via MyChart confirmed with patient.      Jeanie Cooks, PhD [facility], [facility] Social Worker Direct Dial: [phone]  Fax: [phone]

## 2023-07-23 NOTE — Patient Outreach (Signed)
  Care Coordination   Initial Visit Note   07/23/2023 Name: Richard Hull MRN: [id number] DOB: 1975/08/23  Richard Hull is a 47 y.o. year old male who sees [name], MD for primary care. I spoke with  Richard Hull by phone today.  What matters to the patients health and wellness today?  Medical bills     Goals Addressed             This Visit's Progress    Care Coordination Activities       Care Coordination Interventions: Patient stated that he received a bill from the hospital for a 08/2022 admission for $19,000 and the isurance declined to pay, SW discussed about calling the billing division at [facility] to see if the bill can be re filed. SW will follow up with the patient on  08/08/2023 at 10:30 am.        SDOH assessments and interventions completed:  Yes  SDOH Interventions Today    Flowsheet Row Most Recent Value  SDOH Interventions   Food Insecurity Interventions Intervention Not Indicated  Housing Interventions Intervention Not Indicated  Transportation Interventions Intervention Not Indicated  Utilities Interventions Intervention Not Indicated  Financial Strain Interventions Other (Comment)  [Patient received a bill from the hospital for a 08/2022 admission for $19,000 and the isurance declined to pay]        Care Coordination Interventions:  Yes, provided  Interventions Today    Flowsheet Row Most Recent Value  General Interventions   General Interventions Discussed/Reviewed General Interventions Discussed, [employer] received a bill from the hospital for a 08/2022 admission for $19,000 and the isurance declined to pay]        Follow up plan: Follow up call scheduled for 08/08/2023 at 10:30 am    Encounter Outcome:  Patient Visit Completed   Jeanie Cooks, PhD [facility], [facility] Social Worker Direct Dial: [phone]  Fax: [phone]

## 2023-08-08 ENCOUNTER — Ambulatory Visit: Payer: Self-pay | Admitting: Licensed Clinical Social Worker

## 2023-08-08 ENCOUNTER — Ambulatory Visit: Payer: Self-pay

## 2023-08-08 NOTE — Patient Instructions (Signed)
Visit Information  Thank you for taking time to visit with me today. Please don't hesitate to contact me if I can be of assistance to you.   Following are the goals we discussed today:   Goals Addressed             This Visit's Progress    To get blood sugars under control   On track    Care Coordination Interventions: Evaluation of current treatment plan related to type II diabetes and patient's adherence to plan as established by provider Review of patient status, including review of consultants reports, relevant laboratory and other test results, and medications completed Advised patient, providing education and rationale, to check cbg daily before meals and at bedtime and record, calling PCP for findings outside established parameters Determined patient is using a Dexcom sensor for continuous monitoring  Counseled on Diabetic diet, my plate method, 329 minutes of moderate intensity exercise/week Lab Results  Component Value Date   HGBA1C 6.8 (H) 01/04/2023           Our next appointment is by telephone on 10/09/23 at 2:00 PM  Please call the care guide team at [phone] if you need to cancel or reschedule your appointment.   If you are experiencing a Mental Health or Behavioral Health Crisis or need someone to talk to, please call 1-800-273-TALK (toll free, 24 hour hotline)  Patient verbalizes understanding of instructions and care plan provided today and agrees to view in MyChart. Active MyChart status and patient understanding of how to access instructions and care plan via MyChart confirmed with patient.     Delsa Sale RN BSN CCM [place]  [facility], [facility] Health Nurse Care Coordinator  Direct Dial: [phone] Website: [email or web address]

## 2023-08-08 NOTE — Patient Outreach (Signed)
  Care Coordination   Follow Up Visit Note   08/08/2023 Name: Richard Hull MRN: [id number] DOB: 1975-09-18  Richard Hull is a 47 y.o. year old male who sees Richard Corn, MD for primary care. I spoke with  Richard Hull by phone today.  What matters to the patients health and wellness today?  Patient would like to continue to keep his diabetes under good control, maintaining an A1C <7.0.     Goals Addressed             This Visit's Progress    To get blood sugars under control   On track    Care Coordination Interventions: Evaluation of current treatment plan related to type II diabetes and patient's adherence to plan as established by provider Review of patient status, including review of consultants reports, relevant laboratory and other test results, and medications completed Advised patient, providing education and rationale, to check cbg daily before meals and at bedtime and record, calling PCP for findings outside established parameters Determined patient is using a Dexcom sensor for continuous monitoring  Counseled on Diabetic diet, my plate method, 914 minutes of moderate intensity exercise/week Lab Results  Component Value Date   HGBA1C 6.8 (H) 01/04/2023       Interventions Today    Flowsheet Row Most Recent Value  Chronic Disease   Chronic disease during today's visit Diabetes  General Interventions   General Interventions Discussed/Reviewed General Interventions Discussed, General Interventions Reviewed, Doctor Visits, Labs, Durable Medical Equipment (DME)  Doctor Visits Discussed/Reviewed Doctor Visits Discussed, Doctor Visits Reviewed, PCP  Durable Medical Equipment (DME) Glucomoter  Education Interventions   Education Provided Provided Education  Provided Verbal Education On Nutrition, Blood Sugar Monitoring, Labs, When to see the doctor, Medication  Nutrition Interventions   Nutrition Discussed/Reviewed Nutrition Discussed, Nutrition Reviewed,  Carbohydrate meal planning, Portion sizes  Pharmacy Interventions   Pharmacy Dicussed/Reviewed Pharmacy Topics Discussed, Pharmacy Topics Reviewed          SDOH assessments and interventions completed:  No     Care Coordination Interventions:  Yes, provided   Follow up plan: Follow up call scheduled for 10/09/23 @2 :00 PM    Encounter Outcome:  Patient Visit Completed

## 2023-08-08 NOTE — Patient Instructions (Signed)
Visit Information  Thank you for taking time to visit with me today. Please don't hesitate to contact me if I can be of assistance to you.   Following are the goals we discussed today:   Goals Addressed             This Visit's Progress    COMPLETED: Care Coordination Activities       Care Coordination Interventions: Patient stated that he received a bill from the hospital for a 08/2022 admission for $19,000 and the isurance declined to pay, SW discussed about calling the billing division at [facility] to see if the bill can be re filed. SW will follow up with the patient on  08/08/2023 at 10:30 am.        No further follow up needed  Please call the care guide team at [phone] if you need to cancel or reschedule your appointment.   If you are experiencing a Mental Health or Behavioral Health Crisis or need someone to talk to, please call the Suicide and Crisis Lifeline: 988 call 911  Patient verbalizes understanding of instructions and care plan provided today and agrees to view in MyChart. Active MyChart status and patient understanding of how to access instructions and care plan via MyChart confirmed with patient.     Jeanie Cooks, PhD [facility], [facility] Social Worker Direct Dial: [phone]  Fax: [phone]

## 2023-08-08 NOTE — Patient Outreach (Signed)
  Care Coordination   Follow Up Visit Note   08/08/2023 Name: Richard Hull MRN: [id number] DOB: 31-Mar-1976  Richard Hull is a 47 y.o. year old male who sees Richard Corn, MD for primary care. I spoke with  Richard Hull by phone today.  What matters to the patients health and wellness today?  Bill from hospital and patient has spoken to billing and the claim has been resubmitted to insurance.    Goals Addressed             This Visit's Progress    COMPLETED: Care Coordination Activities       Care Coordination Interventions: Patient stated that he received a bill from the hospital for a 08/2022 admission for $19,000 and the isurance declined to pay, SW discussed about calling the billing division at [facility] to see if the bill can be re filed. SW will follow up with the patient on  08/08/2023 at 10:30 am.        SDOH assessments and interventions completed:  Yes     Care Coordination Interventions:  Yes, provided  Interventions Today    Flowsheet Row Most Recent Value  General Interventions   General Interventions Discussed/Reviewed General Interventions Reviewed  [Patient has spoken to Billing and the calim was resubmitted to insurance]        Follow up plan: No further intervention required.   Encounter Outcome:  Patient Visit Completed   Jeanie Cooks, PhD [facility], [facility] Social Worker Direct Dial: [phone]  Fax: [phone]

## 2023-08-12 DIAGNOSIS — R7989 Other specified abnormal findings of blood chemistry: Secondary | ICD-10-CM | POA: Diagnosis not present

## 2023-08-12 DIAGNOSIS — R748 Abnormal levels of other serum enzymes: Secondary | ICD-10-CM | POA: Diagnosis not present

## 2023-08-21 ENCOUNTER — Other Ambulatory Visit: Payer: Self-pay | Admitting: Medical Genetics

## 2023-08-22 DIAGNOSIS — G4733 Obstructive sleep apnea (adult) (pediatric): Secondary | ICD-10-CM | POA: Diagnosis not present

## 2023-08-28 ENCOUNTER — Other Ambulatory Visit (HOSPITAL_COMMUNITY)
Admission: RE | Admit: 2023-08-28 | Discharge: 2023-08-28 | Disposition: A | Discharge status: Home / Self Care | Payer: Self-pay | Source: Ambulatory Visit | Attending: Oncology | Admitting: Oncology

## 2023-09-05 DIAGNOSIS — E119 Type 2 diabetes mellitus without complications: Secondary | ICD-10-CM | POA: Diagnosis not present

## 2023-09-05 DIAGNOSIS — I119 Hypertensive heart disease without heart failure: Secondary | ICD-10-CM | POA: Diagnosis not present

## 2023-09-05 DIAGNOSIS — E785 Hyperlipidemia, unspecified: Secondary | ICD-10-CM | POA: Diagnosis not present

## 2023-09-06 LAB — GENECONNECT MOLECULAR SCREEN: Genetic Analysis Overall Interpretation: NEGATIVE

## 2023-09-22 DIAGNOSIS — G4733 Obstructive sleep apnea (adult) (pediatric): Secondary | ICD-10-CM | POA: Diagnosis not present

## 2023-10-09 ENCOUNTER — Ambulatory Visit: Payer: Self-pay

## 2023-10-09 NOTE — Patient Outreach (Signed)
  Care Coordination   10/09/2023 Name: Richard Hull MRN: [id number] DOB: 1976-08-13   Care Coordination Outreach Attempts:  An unsuccessful outreach was attempted for an appointment today.  Follow Up Plan:  No further outreach attempts will be made at this time. We have been unable to contact the patient to offer or enroll patient in complex care management services.  Encounter Outcome:  No Answer   Care Coordination Interventions:  No, not indicated    Delsa Sale RN BSN CCM [place]  [facility], [facility] Health Nurse Care Coordinator  Direct Dial: [phone] Website: [email or web address]

## 2023-10-23 ENCOUNTER — Other Ambulatory Visit: Payer: Self-pay | Admitting: Cardiovascular Disease

## 2023-11-20 DIAGNOSIS — G4733 Obstructive sleep apnea (adult) (pediatric): Secondary | ICD-10-CM | POA: Diagnosis not present

## 2023-12-20 DIAGNOSIS — G4733 Obstructive sleep apnea (adult) (pediatric): Secondary | ICD-10-CM | POA: Diagnosis not present

## 2024-01-03 DIAGNOSIS — E119 Type 2 diabetes mellitus without complications: Secondary | ICD-10-CM | POA: Diagnosis not present

## 2024-01-20 DIAGNOSIS — G4733 Obstructive sleep apnea (adult) (pediatric): Secondary | ICD-10-CM | POA: Diagnosis not present

## 2024-01-27 ENCOUNTER — Other Ambulatory Visit: Payer: Self-pay | Admitting: Cardiovascular Disease

## 2024-02-18 DIAGNOSIS — G4733 Obstructive sleep apnea (adult) (pediatric): Secondary | ICD-10-CM | POA: Diagnosis not present

## 2024-02-22 ENCOUNTER — Other Ambulatory Visit: Payer: Self-pay | Admitting: Cardiovascular Disease

## 2024-02-24 DIAGNOSIS — G4733 Obstructive sleep apnea (adult) (pediatric): Secondary | ICD-10-CM | POA: Diagnosis not present

## 2024-02-26 ENCOUNTER — Other Ambulatory Visit: Payer: Self-pay | Admitting: Cardiovascular Disease

## 2024-03-03 ENCOUNTER — Ambulatory Visit: Attending: Cardiology | Admitting: Cardiology

## 2024-03-03 ENCOUNTER — Encounter: Payer: Self-pay | Admitting: Cardiology

## 2024-03-03 VITALS — BP 158/90 | HR 87 | Ht 69.0 in | Wt 248.4 lb

## 2024-03-03 DIAGNOSIS — I10 Essential (primary) hypertension: Secondary | ICD-10-CM

## 2024-03-03 DIAGNOSIS — G4733 Obstructive sleep apnea (adult) (pediatric): Secondary | ICD-10-CM

## 2024-03-03 DIAGNOSIS — Z79899 Other long term (current) drug therapy: Secondary | ICD-10-CM

## 2024-03-03 MED ORDER — VALSARTAN 320 MG PO TABS: 320.0000 mg | ORAL_TABLET | Freq: Every day | ORAL | 3 refills | Status: DC

## 2024-03-03 MED ORDER — NEBIVOLOL HCL 5 MG PO TABS: 5.0000 mg | ORAL_TABLET | Freq: Every day | ORAL | 3 refills | Status: AC

## 2024-03-03 NOTE — Progress Notes (Signed)
 SLEEP MEDICINE OFFICE VISIT NOTE:   Date:  03/03/2024   ID:  Richard Hull, DOB October 02, 1975, MRN [id number] The patient was identified using 2 identifiers.  PCP:  Onita Rush, MD  Cardiologist:  Debby Sor, MD    Referring MD: Onita Rush, MD   Chief Complaint  Patient presents with   Sleep Apnea   Hypertension    History of Present Illness:    Richard Hull is a 48 y.o. male with a hx of hyperlipidemia, CAD and hypertension.  He was followed by Dr. Sor for his obstructive sleep apnea on CPAP and Dr. Sor has now retired.  The patient is now here to see me for his 1 year follow-up for his obstructive sleep apnea.  He has a history of severe obstructive sleep apnea with an AHI of 46.8/h on his initial sleep study.  He was titrated to 10 cm H2O.  He received a new ResMed AirSense 11 AutoSet unit in 2023 and currently is on auto CPAP from 11 to 20 cm H2O.  He is doing well with his PAP device and thinks that he has gotten used to it.  He tolerates the under the nose full face mask and feels the pressure is adequate.  Since going on PAP he feels rested in the am and has no significant daytime sleepiness.  He denies any significant mouth or nasal dryness or nasal congestion. He does wake up some in the am and his water chamber is dry.   He does not think that he snores.    Past Medical History:  Diagnosis Date   Cervical radiculopathy at C5 01/27/2016   left   Coronary artery disease 08/2015   Depression    GERD (gastroesophageal reflux disease)    History of kidney stones    Hyperlipidemia LDL goal <70    Hypertension    Hypertriglyceridemia    Left arm weakness    Myocardial infarction (HCC) 08/2015   OCD (obsessive compulsive disorder)    Sleep apnea     Past Surgical History:  Procedure Laterality Date   ANTERIOR CERVICAL DECOMP/DISCECTOMY FUSION N/A 05/01/2017   Procedure: Anterior Cervical Decompression Fusion Cervical Four-Five Cervical Five-Six;  Surgeon:  Alix Charleston, MD;  Location: [facility] OR;  Service: Neurosurgery;  Laterality: N/A;  Anterior Cervical Decompression Fusion Cervical Four-Five Cervical Five-Six   CARDIAC CATHETERIZATION N/A 09/15/2015   Procedure: Left Heart Cath and Coronary Angiography;  Surgeon: Debby DELENA Sor, MD; LAD 30%, pRCA 30%, dRCA 30%, RPDA 99%, nl LV function    CARDIAC CATHETERIZATION N/A 09/15/2015   Procedure: Coronary Stent Intervention;  Surgeon: Debby DELENA Sor, MD; 3.515 mm Xience Alpine DES to dRCA/RPDA>> 5% residual      LEFT HEART CATH AND CORONARY ANGIOGRAPHY N/A 11/28/2016   Procedure: Left Heart Cath and Coronary Angiography;  Surgeon: Debby DELENA Sor, MD;  Location: [facility] IN[facility]ASIVE [facility];  Service: Cardiovascular;  Laterality: N/A;   TONSILLECTOMY      Current Medications: Current Meds  Medication Sig   amLODipine  (NORVASC ) 10 MG tablet Take 1 tablet by mouth every evening.   aspirin  EC 81 MG EC tablet Take 1 tablet (81 mg total) by mouth daily. (Patient taking differently: Take 81 mg by mouth every evening.)   Cholecalciferol (VITAMIN D3) 1.25 MG (50000 UT) CAPS Take 1.25 mg by mouth every Friday.   diclofenac  (VOLTAREN ) 75 MG EC tablet Take 75 mg by mouth 2 (two) times daily as needed for moderate pain (pain  score 4-6).   icosapent  Ethyl (VASCEPA ) 1 g capsule TAKE 2 CAPSULES BY MOUTH TWICE A DAY   LANTUS SOLOSTAR 100 UNIT/ML Solostar Pen Inject 12 Units into the skin daily.   Magnesium  Gluconate 250 MG TABS Take 1 tablet by mouth daily.   metoprolol  succinate (TOPROL -XL) 50 MG 24 hr tablet TAKE 1 TABLET BY MOUTH EVERY DAY WITH OR IMMEDIATELY FOLLOWING A MEAL   naproxen sodium (ALEVE) 220 MG tablet Take 660 mg by mouth daily as needed (pain).   nitroGLYCERIN  (NITROSTAT ) 0.4 MG SL tablet Place 1 tablet (0.4 mg total) under the tongue every 5 (five) minutes as needed for chest pain (CP or SOB).   rosuvastatin  (CRESTOR ) 20 MG tablet TAKE 1 TABLET BY MOUTH EVERY DAY (Patient taking differently: Take 20 mg by  mouth every evening.)   valsartan  (DIOVAN ) 160 MG tablet TAKE 1 TABLET BY MOUTH EVERY DAY (Patient taking differently: Take 160 mg by mouth every evening.)     Allergies:   Benicar  [olmesartan ], Coreg [carvedilol], Mounjaro [tirzepatide], Ozempic (0.25 or 0.5 mg-dose) [semaglutide(0.25 or 0.5mg -dos)], Amoxil [amoxicillin], and Penicillins   Social History   Socioeconomic History   Marital status: Legally Separated    Spouse name: Not on file   Number of children: 3   Years of education: Some Coll   Highest education level: Not on file  Occupational History   Occupation: Occupational psychologist: [employer]  Tobacco Use   Smoking status: Never   Smokeless tobacco: Never  Vaping Use   Vaping status: Never Used  Substance and Sexual Activity   Alcohol use: Yes    Alcohol/week: 0.0 standard drinks of alcohol    Comment: Rarely   Drug use: No   Sexual activity: Yes  Other Topics Concern   Not on file  Social History Narrative   Multiple family members on his father's side have/had CAD at a young age, uncles and grandparents.   Lives at home with his wife and three children.   Right-handed.   2-3 cups caffeine per week.   Social Drivers of Corporate investment banker Strain: High Risk (07/23/2023)   Overall Financial Resource Strain (CARDIA)    Difficulty of Paying Living Expenses: Hard  Food Insecurity: No Food Insecurity (07/23/2023)   Hunger Vital Sign    Worried About Running Out of Food in the Last Year: Never true    Ran Out of Food in the Last Year: Never true  Transportation Needs: No Transportation Needs (07/23/2023)   PRAPARE - Administrator, Civil Service (Medical): No    Lack of Transportation (Non-Medical): No  Physical Activity: Not on file  Stress: Not on file  Social Connections: Not on file     Family History: The patient's family history includes Diabetes in his maternal grandfather and paternal grandfather; Heart attack (age of  onset: 30) in his father; Heart disease in his paternal grandfather; Hyperlipidemia in his sister; Hypertension in his father, maternal grandfather, maternal grandmother, paternal grandfather, and paternal grandmother; Skin cancer in his mother; Stroke in his father and paternal grandmother.  ROS:   Please see the history of present illness.    ROS  All other systems reviewed and negative.   EKGs/Labs/Other Studies Reviewed:    The following studies were reviewed today: [facility]  Physical Exam:    VS:  BP (!) 158/90   Pulse 87   Ht 5' 9 (1.753 m)   Wt 248 lb 6.4 oz (  112.7 kg)   SpO2 97%   BMI 36.68 kg/m     Wt Readings from Last 3 Encounters:  03/03/24 248 lb 6.4 oz (112.7 kg)  06/07/23 250 lb (113.4 kg)  11/12/22 250 lb (113.4 kg)      ASSESSMENT:    1. OSA (obstructive sleep apnea)   2. Essential hypertension    PLAN:    In order of problems listed above:  OSA - The patient is tolerating PAP therapy well without any problems. The PAP download performed by his DME was personally reviewed and interpreted by me today and showed an AHI of 0.9 /hr on auto CPAP from 11-20 cm H2O with 100% compliance in using more than 4 hours nightly.  The patient has been using and benefiting from PAP use and will continue to benefit from  therapy.   Hypertension - BP borderline controlled on exam today>>usually it runs 140/38mmHg at home>>goal < 130/72mmHg - Continue amlodipine  10 mg daily with as needed refills - he has been feeling lousy on the BB so the plan is to change Toprol  to Bystolic  5mg  daily - will  increase Valsartan  to 320mg  daily for better BP control - goal would be to wean off BB if BP allows give his sx of fatigue on the BB  Will have him see Dr. Anner for his Cardiac issues in 4 weeks and me in 1 year  Time Spent: 20 minutes total time of encounter, including 15 minutes spent in face-to-face patient care on the date of this encounter. This time includes coordination  of care and counseling regarding above mentioned problem list. Remainder of non-face-to-face time involved reviewing chart documents/testing relevant to the patient encounter and documentation in the medical record. I have independently reviewed documentation from referring provider  Medication Adjustments/Labs and Tests Ordered: Current medicines are reviewed at length with the patient today.  Concerns regarding medicines are outlined above.  No orders of the defined types were placed in this encounter.  No orders of the defined types were placed in this encounter.   Signed, Wilbert Bihari, MD  03/03/2024 10:38 AM    [facility]

## 2024-03-03 NOTE — Addendum Note (Signed)
 Addended by: JANIT GENI CROME on: 03/03/2024 10:56 AM   Modules accepted: Orders

## 2024-03-03 NOTE — Patient Instructions (Addendum)
 Medication Instructions:  Please STOP taking TOPROL .   Please START taking bystolic  5 mg daily.  Please INCREASE your dose of valsartan  to 320 mg daily.   *If you need a refill on your cardiac medications before your next appointment, please call your pharmacy*  Lab Work: Please complete a BMET at any [facility] in one week. You do not need to be fasting.  If you have labs (blood work) drawn today and your tests are completely normal, you will receive your results only by: MyChart Message (if you have MyChart) OR A paper copy in the mail If you have any lab test that is abnormal or we need to change your treatment, we will call you to review the results.  Testing/Procedures: None.  Follow-Up: At [facility], you and your health needs are our priority.  As part of our continuing mission to provide you with exceptional heart care, our providers are all part of one team.  This team includes your primary Cardiologist (physician) and Advanced Practice Providers or APPs (Physician Assistants and Nurse Practitioners) who all work together to provide you with the care you need, when you need it.  Your next appointment:   1 year(s)  Provider:   Dr. Wilbert Bihari, MD   We recommend signing up for the patient portal called MyChart.  Sign up information is provided on this After Visit Summary.  MyChart is used to connect with patients for Virtual Visits (Telemedicine).  Patients are able to view lab/test results, encounter notes, upcoming appointments, etc.  Non-urgent messages can be sent to your provider as well.   To learn more about what you can do with MyChart, go to [email or web address].   Other Instructions Please check your blood pressure twice a day for one week, once at lunch and once at dinner. Write down the day and time of each reading as well as heart rate if your home machine provides one. Then drop off your readings at our front desk, send over MyChart or call one of  our operators to give the readings.   Please make an appointment to get established with Dr. Anner for cardiology care in 4 weeks.

## 2024-03-03 NOTE — Addendum Note (Signed)
 Addended by: JANIT GENI CROME on: 03/03/2024 10:58 AM   Modules accepted: Orders

## 2024-03-13 ENCOUNTER — Telehealth: Payer: Self-pay

## 2024-03-13 ENCOUNTER — Encounter: Payer: Self-pay | Admitting: Cardiology

## 2024-03-13 ENCOUNTER — Other Ambulatory Visit (HOSPITAL_COMMUNITY): Payer: Self-pay

## 2024-03-13 DIAGNOSIS — I10 Essential (primary) hypertension: Secondary | ICD-10-CM

## 2024-03-13 DIAGNOSIS — Z79899 Other long term (current) drug therapy: Secondary | ICD-10-CM | POA: Diagnosis not present

## 2024-03-13 DIAGNOSIS — N179 Acute kidney failure, unspecified: Secondary | ICD-10-CM

## 2024-03-13 MED ORDER — CHLORTHALIDONE 25 MG PO TABS: 25.0000 mg | ORAL_TABLET | Freq: Every day | ORAL | 3 refills | Status: AC

## 2024-03-13 MED ORDER — CHLORTHALIDONE 25 MG PO TABS
25.0000 mg | ORAL_TABLET | Freq: Every day | ORAL | 3 refills | Status: DC
  Filled 2024-03-13: qty 90, 90d supply, fill #0

## 2024-03-13 MED ORDER — POTASSIUM CHLORIDE CRYS ER 20 MEQ PO TBCR: 20.0000 meq | EXTENDED_RELEASE_TABLET | Freq: Every day | ORAL | 3 refills | Status: AC

## 2024-03-13 MED ORDER — POTASSIUM CHLORIDE CRYS ER 20 MEQ PO TBCR
20.0000 meq | EXTENDED_RELEASE_TABLET | Freq: Every day | ORAL | 3 refills | Status: DC
  Filled 2024-03-13: qty 90, 90d supply, fill #0

## 2024-03-13 NOTE — Addendum Note (Signed)
 Addended by: JANIT GENI CROME on: 03/13/2024 05:34 PM   Modules accepted: Orders

## 2024-03-13 NOTE — Telephone Encounter (Signed)
 [facility] to patient to advise that Dr. Shlomo recommends to add chlorthalidone 25 mg daily along with K-Dur 20 mEq daily. Then repeat a BMP on Monday and have him check BP twice daily for a week and call with results. Orders placed, patient notified of instructions over [facility].

## 2024-03-14 ENCOUNTER — Ambulatory Visit: Payer: Self-pay | Admitting: Cardiology

## 2024-03-14 LAB — BASIC METABOLIC PANEL WITH GFR
BUN/Creatinine Ratio: 14 (ref 9–20)
BUN: 13 mg/dL (ref 6–24)
CO2: 19 mmol/L — ABNORMAL LOW (ref 20–29)
Calcium: 9.2 mg/dL (ref 8.7–10.2)
Chloride: 97 mmol/L (ref 96–106)
Creatinine, Ser: 0.9 mg/dL (ref 0.76–1.27)
Glucose: 123 mg/dL — ABNORMAL HIGH (ref 70–99)
Sodium: 130 mmol/L — ABNORMAL LOW (ref 134–144)
eGFR: 106 mL/min/1.73 (ref >59)

## 2024-03-20 DIAGNOSIS — G4733 Obstructive sleep apnea (adult) (pediatric): Secondary | ICD-10-CM | POA: Diagnosis not present

## 2024-03-27 DIAGNOSIS — R109 Unspecified abdominal pain: Secondary | ICD-10-CM | POA: Diagnosis not present

## 2024-03-27 DIAGNOSIS — I119 Hypertensive heart disease without heart failure: Secondary | ICD-10-CM | POA: Diagnosis not present

## 2024-03-27 DIAGNOSIS — R1084 Generalized abdominal pain: Secondary | ICD-10-CM | POA: Diagnosis not present

## 2024-03-27 DIAGNOSIS — R7989 Other specified abnormal findings of blood chemistry: Secondary | ICD-10-CM | POA: Diagnosis not present

## 2024-03-27 DIAGNOSIS — Z1389 Encounter for screening for other disorder: Secondary | ICD-10-CM | POA: Diagnosis not present

## 2024-04-02 ENCOUNTER — Encounter: Payer: Self-pay | Admitting: Cardiology

## 2024-04-15 ENCOUNTER — Ambulatory Visit: Admitting: Cardiology

## 2024-04-15 ENCOUNTER — Ambulatory Visit: Attending: Cardiology | Admitting: Cardiology

## 2024-04-15 ENCOUNTER — Encounter: Payer: Self-pay | Admitting: Cardiology

## 2024-04-15 VITALS — BP 134/80 | HR 78 | Ht 69.0 in | Wt 253.0 lb

## 2024-04-15 DIAGNOSIS — E785 Hyperlipidemia, unspecified: Secondary | ICD-10-CM

## 2024-04-15 DIAGNOSIS — I1A0 Resistant hypertension: Secondary | ICD-10-CM

## 2024-04-15 DIAGNOSIS — G4733 Obstructive sleep apnea (adult) (pediatric): Secondary | ICD-10-CM

## 2024-04-15 DIAGNOSIS — Z9861 Coronary angioplasty status: Secondary | ICD-10-CM

## 2024-04-15 DIAGNOSIS — Z794 Long term (current) use of insulin: Secondary | ICD-10-CM

## 2024-04-15 DIAGNOSIS — E781 Pure hyperglyceridemia: Secondary | ICD-10-CM

## 2024-04-15 DIAGNOSIS — Z6837 Body mass index (BMI) 37.0-37.9, adult: Secondary | ICD-10-CM

## 2024-04-15 DIAGNOSIS — E118 Type 2 diabetes mellitus with unspecified complications: Secondary | ICD-10-CM

## 2024-04-15 DIAGNOSIS — E66812 Obesity, class 2: Secondary | ICD-10-CM

## 2024-04-15 DIAGNOSIS — I2111 ST elevation (STEMI) myocardial infarction involving right coronary artery: Secondary | ICD-10-CM

## 2024-04-15 DIAGNOSIS — I2510 Atherosclerotic heart disease of native coronary artery without angina pectoris: Secondary | ICD-10-CM

## 2024-04-15 DIAGNOSIS — I10 Essential (primary) hypertension: Secondary | ICD-10-CM

## 2024-04-15 NOTE — Patient Instructions (Addendum)
 Medication Instructions:   Not changes *If you need a refill on your cardiac medications before your next appointment, please call your pharmacy*   Lab Work: Not needed    Testing/Procedures:  Not needed  Follow-Up: At [facility], you and your health needs are our priority.  As part of our continuing mission to provide you with exceptional heart care, we have created designated Provider Care Teams.  These Care Teams include your primary Cardiologist (physician) and Advanced Practice Providers (APPs -  Physician Assistants and Nurse Practitioners) who all work together to provide you with the care you need, when you need it.     Your next appointment:   7 month(s)  The format for your next appointment:   In Person  Provider:   Dr Anner

## 2024-04-15 NOTE — Progress Notes (Signed)
 Cardiology Office Note:  .   Date:  04/19/2024  ID:  CURLEY HOGEN, DOB 12/30/1975, MRN [id number] PCP: Onita Rush, MD  [facility] Providers Cardiologist:  Alm Clay, MD Sleep Medicine:  Wilbert Bihari, MD     Chief Complaint  Patient presents with   Follow-up    Establish new Primary Cardiologist.  Apparently Dr. Bihari only 1 to treat OSA.   Coronary Artery Disease    No angina    Patient Profile: .     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  PMH: CAD/inferior STEMI (09/15/2015): Large dominant RCA with distal lesion and embolization down to PDA => DES PCI to distal RCA with Xience Alpine 3.5 x 15 mm stent postdilated to 3.75 mm, and PTCA of the mid PDA. Echo (09/18/2015): EF 65 to 70%.  No RWMA.  G1 DD. OSA-on CPAP Using a [employer] 11 AutoSet -> as March 2024 was using 100% compliance Now following Dr. Bihari Hyperlipidemia-well-controlled On Inclisiran (Leqvio ), Crestor  20 really add Vascepa  2 g twice daily Hypertension-well-controlled: Amlodipine  10 mg daily, chlorthalidone  25 mg daily, Bystolic  5 mg daily and Diovan  320 mg daily. DM-2-now on Lantus.    Richard Hull was last seen by Dr. Charlena Sor on November 12, 2022 was having insurance issues with getting inclisiran injections covered..    Subjective  Discussed the use of AI scribe software for clinical note transcription with the patient, who gave verbal consent to proceed.  History of Present Illness Richard Hull is a 48 year old male with coronary artery disease and sleep apnea who presents for follow-up regarding his cardiac health and management of his conditions.  He has a history of a myocardial infarction in January 2017, which occurred during sleep and  was associated with chest pain. A heart catheterization revealed a 99% occlusion in the posterior descending artery, treated with a stent. Subsequent stress tests in February 2018 showed some concerns, but he has not experienced any chest pain, pressure, or tightness since starting CPAP therapy. No heart racing, skipping, or shortness of breath when lying flat.  He uses CPAP for sleep apnea and reports sleeping well with it. 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  He has a history of high triglycerides, initially in the 1200s, but improved to 189 as of January 2025. He attributes a past episode of pancreatitis in October 2023 to a reaction to GLP-1 drugs, specifically Ozempic and Mojaro, which also led to a diagnosis of diabetes. He manages his diabetes with Lantus and his cholesterol with rosuvastatin  20 mg and Vascepa , taken as two tablets twice a day.  For hypertension, he is on Bystolic  5 mg, valsartan  320 mg, amlodipine  10 mg, and chlorthalidone . He also takes aspirin  81 mg daily for his coronary artery disease. His blood pressure has been well-controlled with these medications, noting a significant improvement since starting Bystolic . He monitors his blood pressure at home and reports it to his primary care physician, with some readings as low as 117/73.  No swelling in his legs, dizziness, lightheadedness, or blood in his stools or urine. He is active, engaging in hiking, walking, and  weightlifting, although he notes some difficulty when carrying heavy loads uphill. He has gained weight over the past five years, which he attributes to changes in his activity level and eating habits. He stays hydrated, especially when working outside, and uses electrolytes to maintain balance.    Objective    Studies Reviewed: SABRA   EKG Interpretation Date/Time:  Wednesday April 15 2024 08:30:17 EDT Ventricular Rate:  78 PR  Interval:  150 QRS Duration:  114 QT Interval:  378 QTC Calculation: 430 R Axis:   7  Text Interpretation: Normal sinus rhythm Minimal voltage criteria for LVH, may be normal variant ( Cornell product ) When compared with ECG of 07-Jun-2023 05:43, Vent. rate has decreased BY  63 BPM Left posterior fascicular block is no longer Present Confirmed by Anner Lenis ([id number]) on 04/15/2024 8:47:23 AM    Results LABS from PCP Total cholesterol: 103 (09/06/2023) Triglycerides: 189 (09/06/2023) HDL: 36 (09/06/2023) LDL: 29 (09/06/2023) HbA1c: 7.2 (01/03/2024)  DIAGNOSTIC Cardiac Catheterization-PCI: 30% stenosis near to distal LAD, 30% stenosis at the ostium of the proximal right coronary artery, 30% stenosis/ulcerated plaque distal RCA., 99% stenosis in the posterior descending artery => DES PCI dRCA with a 3.5x15 mm Zions Alpine stent deployed to 3.75 mm & PDA PTCA (09/15/2015) Follow-up Catheterization for Abnormal Stress Test: Widely patent distal RCA stent but distal PDA CTO.  Proximal RCA had 25%.  D2 40%.  Mid of distal LAD 25%.  (11/28/2016)   Echocardiogram: Normal LV size.  Severe basal and moderate concentric hypertrophy of the LV.  Decreased EF 65 to 70%.  No RWMA.  GR 1 DD.  Otherwise normal valves.  Normal RV size and function. Myoview : LOW, RISK with ischemia noted in the mid to basal inferior and inferoseptal wall.  EF 54%.  => cardiac cath revealed patent RCA stent with distal PDA occlusion with left-to-right collaterals.  Risk Assessment/Calculations:          Physical Exam:   VS:  BP 134/80   Pulse 78   Ht 5' 9 (1.753 m)   Wt 253 lb (114.8 kg)   SpO2 97%   BMI 37.36 kg/m    Wt Readings from Last 3 Encounters:  04/15/24 253 lb (114.8 kg)  03/03/24 248 lb 6.4 oz (112.7 kg)  06/07/23 250 lb (113.4 kg)     GEN: Well nourished, well groomed; in no acute distress; moderately obese NECK: No JVD; No carotid bruits CARDIAC: Normal S1, S2; RRR, no murmurs, rubs,  gallops RESPIRATORY:  Clear to auscultation without rales, wheezing or rhonchi ; nonlabored, good air movement. ABDOMEN: Soft, non-tender, non-distended EXTREMITIES:  No edema; No deformity     ASSESSMENT AND PLAN: .    Problem List Items Addressed This Visit       Cardiology Problems   CAD S/P percutaneous coronary angioplasty - Primary (Chronic)   8-1/2 years out from Inferior STEMI with RCA PCI-doing well with no active anginal symptoms.  Follow-up cath in 2018 showed occluded PDA which corresponds with the infarct in peri-infarct ischemia noted on Myoview .   No angina or arrhythmia. Normal ejection fraction. Controlled blood pressure. No longer on Thienopyridine.  Tolerating aspirin  well. - Continue aspirin  81 mg daily,  - Hold aspirin  for 5 days prior to procedures if needed. -Continue GDMT with beta blocker, ARB, and calcium  channel blocker: Amlodipine  10 mg daily, Bystolic  5 mg daily, valsartan  320 mg daily, - Continue 20 mg rosuvastatin  daily along with Vascepa  2 and Leqvio  infusions every 6 months  Hyperlipidemia with target low density lipoprotein (LDL) cholesterol less than 55 mg/dL (Chronic)   Labs followed by PCP.  Most recent LDL was 29 as of January 2025. Excellent control with a combination of rosuvastatin  20 Miller daily, Vascepa  2 g twice daily and inclisiran infusions. -Continue current regimen.       Relevant Orders   EKG 12-Lead (Completed)   Hypertriglyceridemia (Chronic)   Triglycerides improved from 1200s to 189 with Leqvio , rosuvastatin  and Vascepa . -Continue Vascepa  2 g twice daily      Resistant hypertension (Chronic)   Well controlled with Bystolic  5 mg, valsartan  320 mg, amlodipine  10 mg, and chlorthalidone  25 mg-Daily.  No dizziness or lightheadedness. - Continue current antihypertensive regimen. - Monitor blood pressure at home and report to primary care.      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  Follow-up cath in 2018 showed occluded PDA which corresponds with the infarct in peri-infarct ischemia noted on Myoview .  Continues to be doing well: No angina or arrhythmia. Normal ejection fraction. Controlled blood pressure. Stable regimen as noted.        Other   Obesity with serious comorbidity   Recent weight gain with decreased physical activity. Engages in hiking, walking, and weight lifting. - Encourage regular physical activity and weight management strategies.      OSA on CPAP (Chronic)   Near 100% compliance.  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   is Now following up with Dr. Shlomo      Type 2 diabetes mellitus with complication, with long-term current use of insulin (HCC) (Chronic)   Managed by PCP.  Last A1c was 7.2. Currently 1 Lantus insulin  Continue per PCP               Follow-Up: Return in about 7 months (around 11/13/2024) for Routine follow up with me, [employer].  I spent 56 minutes in the care of TRELYN VANDERLINDE today including reviewing outside labs from PCPs office from [facility] (1 minute), reviewing studies (cardiac cath films reviewed along with echocardiogram and Myoview -9 minutes), face to face time discussing treatment options (21 minutes), reviewing records from Dr. Burnard and Dr. Dorine notes as well as discharge summary from STEMI (9 minutes), 15 minutes dictating, and documenting in the encounter.      Signed, Alm MICAEL Clay, MD, MS Alm Clay, M.D., M.S. Interventional Chartered certified accountant  Pager # [phone]

## 2024-04-19 ENCOUNTER — Encounter: Payer: Self-pay | Admitting: Cardiology

## 2024-04-19 DIAGNOSIS — E669 Obesity, unspecified: Secondary | ICD-10-CM | POA: Insufficient documentation

## 2024-04-19 DIAGNOSIS — Z794 Long term (current) use of insulin: Secondary | ICD-10-CM | POA: Insufficient documentation

## 2024-04-19 NOTE — Assessment & Plan Note (Signed)
 Triglycerides improved from 1200s to 189 with Leqvio , rosuvastatin  and Vascepa . -Continue Vascepa  2 g twice daily

## 2024-04-19 NOTE — Assessment & Plan Note (Signed)
 Recent weight gain with decreased physical activity. Engages in hiking, walking, and weight lifting. - Encourage regular physical activity and weight management strategies.

## 2024-04-19 NOTE — Assessment & Plan Note (Signed)
 Managed by PCP.  Last A1c was 7.2. Currently 1 Lantus insulin  Continue per PCP

## 2024-04-19 NOTE — Assessment & Plan Note (Addendum)
 8-1/2 years out from Inferior STEMI with RCA PCI-doing well with no active anginal symptoms.  Follow-up cath in 2018 showed occluded PDA which corresponds with the infarct in peri-infarct ischemia noted on Myoview .  Continues to be doing well: No angina or arrhythmia. Normal ejection fraction. Controlled blood pressure. Stable regimen as noted.

## 2024-04-19 NOTE — Assessment & Plan Note (Signed)
 8-1/2 years out from Inferior STEMI with RCA PCI-doing well with no active anginal symptoms.  Follow-up cath in 2018 showed occluded PDA which corresponds with the infarct in peri-infarct ischemia noted on Myoview .   No angina or arrhythmia. Normal ejection fraction. Controlled blood pressure. No longer on Thienopyridine.  Tolerating aspirin  well. - Continue aspirin  81 mg daily,  - Hold aspirin  for 5 days prior to procedures if needed. -Continue GDMT with beta blocker, ARB, and calcium  channel blocker: Amlodipine  10 mg daily, Bystolic  5 mg daily, valsartan  320 mg daily, - Continue 20 mg rosuvastatin  daily along with Vascepa  2 and Leqvio  infusions every 6 months

## 2024-04-19 NOTE — Assessment & Plan Note (Addendum)
 Near 100% compliance.  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   is Now following up with Dr. Shlomo

## 2024-04-19 NOTE — Assessment & Plan Note (Signed)
 Labs followed by PCP.  Most recent LDL was 29 as of January 2025. Excellent control with a combination of rosuvastatin  20 Miller daily, Vascepa  2 g twice daily and inclisiran infusions. -Continue current regimen.

## 2024-04-19 NOTE — Assessment & Plan Note (Signed)
 Well controlled with Bystolic  5 mg, valsartan  320 mg, amlodipine  10 mg, and chlorthalidone  25 mg-Daily.  No dizziness or lightheadedness. - Continue current antihypertensive regimen. - Monitor blood pressure at home and report to primary care.

## 2024-04-20 DIAGNOSIS — G4733 Obstructive sleep apnea (adult) (pediatric): Secondary | ICD-10-CM | POA: Diagnosis not present

## 2024-04-27 ENCOUNTER — Other Ambulatory Visit: Payer: Self-pay

## 2024-04-29 MED ORDER — ROSUVASTATIN CALCIUM 20 MG PO TABS: 20.0000 mg | ORAL_TABLET | Freq: Every day | ORAL | 3 refills | Status: AC

## 2024-05-04 DIAGNOSIS — E119 Type 2 diabetes mellitus without complications: Secondary | ICD-10-CM | POA: Diagnosis not present

## 2024-05-04 DIAGNOSIS — E785 Hyperlipidemia, unspecified: Secondary | ICD-10-CM | POA: Diagnosis not present

## 2024-05-04 DIAGNOSIS — R946 Abnormal results of thyroid function studies: Secondary | ICD-10-CM | POA: Diagnosis not present

## 2024-05-04 DIAGNOSIS — Z125 Encounter for screening for malignant neoplasm of prostate: Secondary | ICD-10-CM | POA: Diagnosis not present

## 2024-05-11 DIAGNOSIS — Z1389 Encounter for screening for other disorder: Secondary | ICD-10-CM | POA: Diagnosis not present

## 2024-05-11 DIAGNOSIS — Z1331 Encounter for screening for depression: Secondary | ICD-10-CM | POA: Diagnosis not present

## 2024-05-11 DIAGNOSIS — Z0000 Encounter for general adult medical examination without abnormal findings: Secondary | ICD-10-CM | POA: Diagnosis not present

## 2024-05-11 DIAGNOSIS — E119 Type 2 diabetes mellitus without complications: Secondary | ICD-10-CM | POA: Diagnosis not present

## 2024-05-11 DIAGNOSIS — R82998 Other abnormal findings in urine: Secondary | ICD-10-CM | POA: Diagnosis not present

## 2024-05-11 DIAGNOSIS — Z23 Encounter for immunization: Secondary | ICD-10-CM | POA: Diagnosis not present

## 2024-06-03 ENCOUNTER — Encounter: Payer: Self-pay | Admitting: Cardiology

## 2024-06-05 NOTE — Progress Notes (Unsigned)
 Cardiology Office Note    Date:  06/08/2024   ID:  Richard Hull, DOB 29-Jun-1976, MRN [id number]  PCP:  Onita Rush, MD  Cardiologist:  Alm Clay, MD  Electrophysiologist:  None   Chief Complaint: Lightheadedness   History of Present Illness:   Richard Hull is a 48 y.o. male with history of CAD with inferior ST elevation MI status post PCI/DES to the RCA in 08/2015, HTN, HLD, and OSA on CPAP who presents for evaluation of positional lightheadedness.  He was admitted to the hospital in 08/2015 with an inferior ST elevation MI.  LHC at that time showed 30% mid to distal LAD stenosis, 30% ostial to proximal RCA stenosis, 30% distal RCA stenosis, and 99% RPDA stenosis.  MI was felt to be secondary to ulcerated plaque in the distal RCA with evidence of distal embolization into the mid PDA vessel.  He underwent PCI/DES to the RCA and PTCA to the PDA.  Echo post procedure showed an EF of 65 to 70%, normal wall motion, grade 1 diastolic dysfunction, normal RV systolic function, normal RVSP, and trivial tricuspid regurgitation.  Lexiscan  MPI in 09/2016 showed a medium defect of severe severity present in the basal inferior and mid inferior location with an EF of 54%.  Overall, this was a low risk study.  Subsequent LHC in 11/2016 showed mid to distal LAD 25% stenosis, 40% stenosis in the ostial D2, ostial to RCA 25% stenosis, distal RCA 0% stenosis, and 100% stenosed small RPDA lesion with antegrade and retrograde collateralization.  Medical therapy was recommended.    More recently, in the summer 2025 he underwent antihypertensive medication changes and a titration including Nebivolol  replacing metoprolol , titration of valsartan , and initiation of chlorthalidone .  He was last seen in the office in 03/2024 with well-controlled blood pressure on Bystolic  5 mg, valsartan  320 mg, amlodipine  10 mg, and chlorthalidone  25 mg.  He sent a MyChart message on 06/03/2024 reporting generalized weakness and  lightheadedness with positional changes.  BP averaging 115/72.  In this setting appointment was scheduled for today.  He comes in noting a 3 to 4-week history of positional lightheadedness with associated near syncope at times, particularly when transitioning from laying to standing.  He reports at home his blood pressures have been in the 1 teens mmHg systolic during these episodes.  No frank syncope.  He maintains adequate hydration.  No lower extremity swelling or progressive orthopnea.  No chest pain.  He does notice some exertional fatigue and shortness of breath that is limited his prior hiking.  Currently takes amlodipine  10 mg and valsartan  320 mg in the evening with Bystolic  5 mg and chlorthalidone  25 mg in the mornings.  He does note arthralgias associated with rosuvastatin , though does not want to pursue PCSK9 inhibitor at this time due to concern for off target effect.   Labs independently reviewed: 04/2024 - TSH normal 03/2024 - BUN 16, serum creatinine 0.89, potassium 3.9, albumin 4.5, AST 57, ALT 60 06/2023 - Hgb 15.9, PLT 287 05/2023 - magnesium  1.8 08/2022 - TC 76, TG 151, HDL 27, LDL 19  Past Medical History:  Diagnosis Date   Cervical radiculopathy at C5 01/27/2016   left   Coronary artery disease 08/2015   Single-vessel CAD of RCA with distal lesion treated with DES PCI and PDA now occluded.   Depression    GERD (gastroesophageal reflux disease)    History of Inferior STEMI 08/2015   Ulcerated plaque in the distal RCA with  distal embolization PDA.  Distal RCA treated with DES PCI and PTCA of distal PDA (PDA now occluded   History of kidney stones    Hyperlipidemia LDL goal <70    Hypertension    Hypertriglyceridemia    Left arm weakness    OCD (obsessive compulsive disorder)    Sleep apnea     Past Surgical History:  Procedure Laterality Date   ANTERIOR CERVICAL DECOMP/DISCECTOMY FUSION N/A 05/01/2017   Procedure: Anterior Cervical Decompression Fusion Cervical  Four-Five Cervical Five-Six;  Surgeon: Alix Charleston, MD;  Location: [facility] OR;  Service: Neurosurgery;  Laterality: N/A;  Anterior Cervical Decompression Fusion Cervical Four-Five Cervical Five-Six   CARDIAC CATHETERIZATION N/A 09/15/2015   Procedure: Left Heart Cath and Coronary Angiography;  Surgeon: Debby DELENA Sor, MD; LAD 30%, pRCA 30%, dRCA 30%, RPDA 99%, nl LV function  = DES PCI dRCA & PTCA of PDA   CARDIAC CATHETERIZATION N/A 09/15/2015   Procedure: Coronary Stent Intervention;  Surgeon: Debby DELENA Sor, MD; 3.515 mm Xience Alpine DES to dRCA/RPDA>> 5% residual      LEFT HEART CATH AND CORONARY ANGIOGRAPHY N/A 11/28/2016   Procedure: Left Heart Cath and Coronary Angiography;  Surgeon: Debby DELENA Sor, MD;  Location: [facility];  Service: Cardiovascular;  prox RCA 25%, patent dRCA stent, 100% m-dPDA (L-R collaterals); Ost D2 40% & m-dLAD 25%.   TONSILLECTOMY      Current Medications: No outpatient medications have been marked as taking for the 06/08/24 encounter (Office Visit) with Abigail Bernardino HERO, PA-C.    Allergies:   Benicar  [olmesartan ], Coreg [carvedilol], Mounjaro [tirzepatide], Ozempic (0.25 or 0.5 mg-dose) [semaglutide(0.25 or 0.5mg -dos)], Amoxil [amoxicillin], and Penicillins   Social History   Socioeconomic History   Marital status: Married    Spouse name: Not on file   Number of children: 3   Years of education: Some Coll   Highest education level: Not on file  Occupational History   Occupation: Occupational psychologist: [employer]  Tobacco Use   Smoking status: Never   Smokeless tobacco: Never  Vaping Use   Vaping status: Never Used  Substance and Sexual Activity   Alcohol use: Yes    Alcohol/week: 0.0 standard drinks of alcohol    Comment: Rarely   Drug use: No   Sexual activity: Yes  Other Topics Concern   Not on file  Social History Narrative   Multiple family members on his father's side have/had CAD at a young age, uncles and grandparents.    Lives at home with his wife and three children.   Right-handed.   2-3 cups caffeine per week.   Social Drivers of Corporate investment banker Strain: High Risk (07/23/2023)   Overall Financial Resource Strain (CARDIA)    Difficulty of Paying Living Expenses: Hard  Food Insecurity: No Food Insecurity (07/23/2023)   Hunger Vital Sign    Worried About Running Out of Food in the Last Year: Never true    Ran Out of Food in the Last Year: Never true  Transportation Needs: No Transportation Needs (07/23/2023)   PRAPARE - Administrator, Civil Service (Medical): No    Lack of Transportation (Non-Medical): No  Physical Activity: Not on file  Stress: Not on file  Social Connections: Not on file     Family History:  The patient's family history includes Diabetes in his maternal grandfather and paternal grandfather; Heart attack (age of onset: 33) in his father; Heart disease in  his paternal grandfather; Hyperlipidemia in his sister; Hypertension in his father, maternal grandfather, maternal grandmother, paternal grandfather, and paternal grandmother; Skin cancer in his mother; Stroke in his father and paternal grandmother.  ROS:   12-point review of systems is negative unless otherwise noted in the HPI.   EKGs/Labs/Other Studies Reviewed:    Studies reviewed were summarized above. The additional studies were reviewed today:  LHC/06/2017: Dist RCA lesion, 0 %stenosed. RPDA lesion, 100 %stenosed. Ost 2nd Diag to 2nd Diag lesion, 40 %stenosed. Mid LAD to Dist LAD lesion, 25 %stenosed. Ost RCA to Prox RCA lesion, 25 %stenosed. The left ventricular ejection fraction is 55-65% by visual estimate. The left ventricular systolic function is normal. LV end diastolic pressure is normal.   Normal LV function without significant wall motion abnormalities.   Mild CAD with 40% smooth ostial narrowing of the DX1 and 25% mid LAD stenosis; normal ramus and left circumflex vessel; mild 25%  proximal RCA narrrowing with widely patent RCA stent beyond the acute margin and total mid occlusion of a small PDA vessel with antegrade and retrograde collateralization.   RECOMMENDATION: Medical therapy.  The patient will be given clearance for his cervical neck surgery. He will need to hold ASA/Brilinta  for at least 5-7 days prior to neurosurgery.  __________  Nuclear stress test 09/20/2016:   Nuclear stress EF: 54%. The left ventricular ejection fraction is mildly decreased (45-54%). There was no ST segment deviation noted during stress. Defect 1: There is a medium defect of severe severity present in the basal inferior and mid inferior location. This is a low risk study.   Low risk stress nuclear study with mild ischemia in the inferior wall; EF 54 with mild hypokinesis of the mid inferior wall. __________  2D echo 09/18/2015: - Left ventricle: The cavity size was normal. There was severe    focal basal and moderate concentric hypertrophy of the let    ventricle. Systolic function was vigorous. The estimated ejection    fraction was in the range of 65% to 70%. Wall motion was normal;    there were no regional wall motion abnormalities. Doppler    parameters are consistent with abnormal left ventricular    relaxation (grade 1 diastolic dysfunction). Doppler parameters    are consistent with elevated ventricular end-diastolic filling    pressure.  - Aortic valve: There was no regurgitation.  - Aortic root: The aortic root was normal in size.  - Mitral valve: There was no regurgitation.  - Left atrium: The atrium was normal in size.  - Right ventricle: Systolic function was normal.  - Right atrium: The atrium was normal in size.  - Tricuspid valve: There was trivial regurgitation.  - Pulmonic valve: There was no regurgitation.  - Pulmonary arteries: Systolic pressure was within the normal    range.  - Inferior vena cava: The vessel was normal in size.  - Pericardium,  extracardiac: There was no pericardial effusion.   Impressions:   - The study more consistent with hypertensive heart disease rather    than hypertrophic cardiomyopathy.  __________  LHC 09/15/2015: Mid LAD to Dist LAD lesion, 30% stenosed. Ost RCA to Prox RCA lesion, 30% stenosed. Dist RCA lesion, 30% stenosed. Post intervention, there is a 0% residual stenosis. RPDA lesion, 99% stenosed. Post intervention, there is a 5% residual stenosis. The left ventricular systolic function is normal.   Acute inferior ST segment elevation myocardial infarction secondary to an ulcerated plaque in the distal RCA with evidence for  distal embolization to the mid PDA vessel.   Coronary anatomy demonstrating a normal left main coronary artery, large LAD vessel with 30% smooth mid narrowing after the second diagonal branch; normal ramus intermediate vessel; normal left circumflex coronary artery; and large dominant RCA with mild 30% narrowing, an ulcerated plaque beyond the acute margin before the PDA takeoff and 99% distal PDA stenosis.   Successful acute coronary intervention to the RCA with PTCA of the PDA vessel with a 99% stenosis being reduced to 5%, and primary stenting of the ulcerated plaque being reduced to 0% with insertion of a 3.515 mm Xience Alpine DES stent postdilated to 3.71 mm.   RECOMMENDATION: The patient will be on dual antiplatelet therapy for minimum of 1 year.  He will need to be treated with aggressive lipid lowering therapy as well as blood pressure control with beta blocker and ACE-I.    EKG:  EKG is ordered today.  The EKG ordered today demonstrates NSR, 85 bpm, poor R wave progression along the precordial leads, no acute st/t changes  Recent Labs: 06/12/2023: ALT 79; Hemoglobin 15.3; Magnesium  1.8; Platelets 214 03/13/2024: BUN 13; Creatinine, Ser 0.90; Potassium CANCELED; Sodium 130  Recent Lipid Panel    Component Value Date/Time   CHOL 76 09/07/2022 1200   CHOL 122  02/21/2022 1213   TRIG 151 (H) 09/07/2022 1200   HDL 27 (L) 09/07/2022 1200   HDL 32 (L) 02/21/2022 1213   CHOLHDL 2.8 09/07/2022 1200   VLDL 30 09/07/2022 1200   LDLCALC 19 09/07/2022 1200   LDLCALC 28 02/21/2022 1213    PHYSICAL EXAM:    VS:  BP 137/82 (BP Location: Left Arm, Patient Position: Sitting, Cuff Size: Large)   Pulse 85   Ht 5' 9 (1.753 m)   Wt 256 lb 2 oz (116.2 kg)   SpO2 98%   BMI 37.82 kg/m   BMI: Body mass index is 37.82 kg/m.  Physical Exam Vitals reviewed.  Constitutional:      Appearance: He is well-developed.  HENT:     Head: Normocephalic and atraumatic.  Eyes:     General:        Right eye: No discharge.        Left eye: No discharge.  Cardiovascular:     Rate and Rhythm: Normal rate and regular rhythm.     Heart sounds: Normal heart sounds, S1 normal and S2 normal. Heart sounds not distant. No midsystolic click and no opening snap. No murmur heard.    No friction rub.  Pulmonary:     Effort: Pulmonary effort is normal. No respiratory distress.     Breath sounds: Normal breath sounds. No decreased breath sounds, wheezing, rhonchi or rales.  Musculoskeletal:     Cervical back: Normal range of motion.     Right lower leg: No edema.     Left lower leg: No edema.  Skin:    General: Skin is warm and dry.     Nails: There is no clubbing.  Neurological:     Mental Status: He is alert and oriented to person, place, and time.  Psychiatric:        Speech: Speech normal.        Behavior: Behavior normal.        Thought Content: Thought content normal.        Judgment: Judgment normal.     Orthostatic Vital Signs: Lying: 133/80, 88 bpm Sitting: 121/70, 87 bpm, lightheaded Standing: 126/80, 90 bpm Standing x 3  minutes: 133/80, 91 bpm    Wt Readings from Last 3 Encounters:  06/08/24 256 lb 2 oz (116.2 kg)  04/15/24 253 lb (114.8 kg)  03/03/24 248 lb 6.4 oz (112.7 kg)     ASSESSMENT & PLAN:   Resistant hypertension with associated  orthostasis: Blood pressure is mildly elevated in the office today, though suspect this is in the setting of him missing evening amlodipine  and valsartan  2 out of the past 3 days and with [place] food last evening.  Orthostatic vital signs are negative in the office, though his dizziness is exacerbated with positional changes, particularly when transitioning from lying to standing.  Obtain echo to evaluate for significant structural abnormality.  Reduce valsartan  to 160 mg in the evening with continuation of amlodipine  10 mg in the evening, chlorthalidone  25 mg in the morning and Bystolic  5 mg in the morning.  CAD involving native coronary arteries without angina: No symptoms concerning for angina or cardiac decompensation.  Continue aggressive risk factor modification and secondary prevention including aspirin  81 mg, rosuvastatin  20 mg, and Vascepa  1 g twice daily.  HLD/hypertriglyceridemia: LDL 19 with a triglyceride of 151 in 08/2023.  Overall, triglycerides are improved from the 1200s.  Continue rosuvastatin  20 mg and Vascepa  1 g twice daily.  Hesitant to try PCSK9 inhibitor due to concern for off target effect.  Obesity with OSA: Heart healthy diet and regular exercise recommended.  CPAP.  This was not discussed at today's visit.     Disposition: F/u with Dr. Anner or an APP in 1 month.   Medication Adjustments/Labs and Tests Ordered: Current medicines are reviewed at length with the patient today.  Concerns regarding medicines are outlined above. Medication changes, Labs and Tests ordered today are summarized above and listed in the Patient Instructions accessible in Encounters.   Signed, Bernardino Bring, PA-C 06/08/2024 4:26 PM     [facility] [place] [address] [place], [place] [address] [phone]

## 2024-06-08 ENCOUNTER — Encounter: Payer: Self-pay | Admitting: Physician Assistant

## 2024-06-08 ENCOUNTER — Ambulatory Visit: Attending: Physician Assistant | Admitting: Physician Assistant

## 2024-06-08 VITALS — BP 137/82 | HR 85 | Ht 69.0 in | Wt 256.1 lb

## 2024-06-08 DIAGNOSIS — E118 Type 2 diabetes mellitus with unspecified complications: Secondary | ICD-10-CM

## 2024-06-08 DIAGNOSIS — I2510 Atherosclerotic heart disease of native coronary artery without angina pectoris: Secondary | ICD-10-CM

## 2024-06-08 DIAGNOSIS — I1A0 Resistant hypertension: Secondary | ICD-10-CM | POA: Diagnosis not present

## 2024-06-08 DIAGNOSIS — Z6837 Body mass index (BMI) 37.0-37.9, adult: Secondary | ICD-10-CM

## 2024-06-08 DIAGNOSIS — G4733 Obstructive sleep apnea (adult) (pediatric): Secondary | ICD-10-CM

## 2024-06-08 DIAGNOSIS — E781 Pure hyperglyceridemia: Secondary | ICD-10-CM

## 2024-06-08 DIAGNOSIS — I951 Orthostatic hypotension: Secondary | ICD-10-CM | POA: Diagnosis not present

## 2024-06-08 DIAGNOSIS — R0602 Shortness of breath: Secondary | ICD-10-CM

## 2024-06-08 DIAGNOSIS — Z794 Long term (current) use of insulin: Secondary | ICD-10-CM

## 2024-06-08 DIAGNOSIS — E66812 Obesity, class 2: Secondary | ICD-10-CM

## 2024-06-08 DIAGNOSIS — E785 Hyperlipidemia, unspecified: Secondary | ICD-10-CM | POA: Diagnosis not present

## 2024-06-08 MED ORDER — VALSARTAN 160 MG PO TABS: 160.0000 mg | ORAL_TABLET | Freq: Every day | ORAL | 0 refills | Status: DC

## 2024-06-08 NOTE — Patient Instructions (Signed)
 Medication Instructions:  DECREASE the Valsartan  to 160 mg once daily in the evening  *If you need a refill on your cardiac medications before your next appointment, please call your pharmacy*  Lab Work: None ordered If you have labs (blood work) drawn today and your tests are completely normal, you will receive your results only by: MyChart Message (if you have MyChart) OR A paper copy in the mail If you have any lab test that is abnormal or we need to change your treatment, we will call you to review the results.  Testing/Procedures: Your physician has requested that you have an echocardiogram. Echocardiography is a painless test that uses sound waves to create images of your heart. It provides your doctor with information about the size and shape of your heart and how well your heart's chambers and valves are working.   You may receive an ultrasound enhancing agent through an IV if needed to better visualize your heart during the echo. This procedure takes approximately one hour.  There are no restrictions for this procedure.  This will take place at [address] [facility] [address], [place] [address]  Please note: We ask at that you not bring children with you during ultrasound (echo/ vascular) testing. Due to room size and safety concerns, children are not allowed in the ultrasound rooms during exams. Our front office staff cannot provide observation of children in our lobby area while testing is being conducted. An adult accompanying a patient to their appointment will only be allowed in the ultrasound room at the discretion of the ultrasound technician under special circumstances. We apologize for any inconvenience.   Follow-Up: At [facility], you and your health needs are our priority.  As part of our continuing mission to provide you with exceptional heart care, our providers are all part of one team.  This team includes your primary Cardiologist  (physician) and Advanced Practice Providers or APPs (Physician Assistants and Nurse Practitioners) who all work together to provide you with the care you need, when you need it.  Your next appointment:   1 month(s)  Provider:   Bernardino Bring, PA-C    We recommend signing up for the patient portal called MyChart.  Sign up information is provided on this After Visit Summary.  MyChart is used to connect with patients for Virtual Visits (Telemedicine).  Patients are able to view lab/test results, encounter notes, upcoming appointments, etc.  Non-urgent messages can be sent to your provider as well.   To learn more about what you can do with MyChart, go to [email or web address].

## 2024-07-08 ENCOUNTER — Ambulatory Visit: Admitting: Physician Assistant

## 2024-07-22 ENCOUNTER — Ambulatory Visit

## 2024-07-30 NOTE — Progress Notes (Signed)
 Cardiology Office Note    Date:  08/05/2024   ID:  Richard Hull, DOB 13-Apr-1976, MRN [id number]  PCP:  Onita Rush, MD  Cardiologist:  Alm Clay, MD  Electrophysiologist:  None   Chief Complaint: Follow up  History of Present Illness:   Richard Hull is a 48 y.o. male with history of CAD with inferior ST elevation MI status post PCI/DES to the RCA in 08/2015, HTN, HLD, and OSA on CPAP who presents for follow-up of HTN and echo.  He was admitted to the hospital in 08/2015 with an inferior ST elevation MI.  LHC at that time showed 30% mid to distal LAD stenosis, 30% ostial to proximal RCA stenosis, 30% distal RCA stenosis, and 99% RPDA stenosis.  MI was felt to be secondary to ulcerated plaque in the distal RCA with evidence of distal embolization into the mid PDA vessel.  He underwent PCI/DES to the RCA and PTCA to the PDA.  Echo post procedure showed an EF of 65 to 70%, normal wall motion, grade 1 diastolic dysfunction, normal RV systolic function, normal RVSP, and trivial tricuspid regurgitation.  Lexiscan  MPI in 09/2016 showed a medium defect of severe severity present in the basal inferior and mid inferior location with an EF of 54%.  Overall, this was a low risk study.  Subsequent LHC in 11/2016 showed mid to distal LAD 25% stenosis, 40% stenosis in the ostial D2, ostial to RCA 25% stenosis, distal RCA 0% stenosis, and 100% stenosed small RPDA lesion with antegrade and retrograde collateralization.  Medical therapy was recommended.     More recently, in the summer 2025 he underwent antihypertensive medication changes and a titration including nebivolol  replacing metoprolol , titration of valsartan , and initiation of chlorthalidone .  He was seen in the office in 03/2024 with well-controlled blood pressure on Bystolic  5 mg, valsartan  320 mg, amlodipine  10 mg, and chlorthalidone  25 mg.  He sent a MyChart message on 06/03/2024 reporting generalized weakness and lightheadedness with positional  changes.  BP averaging 115/72.  He followed up in the office on 06/08/2024 reporting a 3 to 4-week history of positional lightheadedness with associated near syncope at times, particularly when transitioning from laying to standing.  Blood pressures at home were in the 1 teens mmHg systolic during these episodes.  BP was mildly elevated in the office at 137/82.  This was suspected to be in the setting of him missing evening amlodipine  and valsartan  a couple days leading up to his appointment and with a high sodium diet the night before.  Orthostatics were negative in the office, though dizziness was exacerbated with positional changes.  Valsartan  was reduced to 160 mg in the evening with continuation of amlodipine  10 mg in the evening, chlorthalidone  25 mg in the morning, and Bystolic  5 mg in the morning.  Echo was recommended and is pending at this time.  He comes in doing reasonably well from a cardiac perspective and is currently without symptoms of angina or cardiac decompensation.  Since he was last seen he has noted some intermittent episodes of chest discomfort that are randomly occurring and seem to be positional in etiology.  However, he also reports an episode of chest discomfort while hiking recently that improved with resting and leaning up against a tree.  The symptoms did not feel similar to his prior angina.  Dizziness has not fully resolved, though has improved.  He notes dizziness is better if he sits rather than lays, upon standing.  No near-syncope  or frank syncope.  Adherent and tolerating cardiac pharmacotherapy without off target effect.  Longstanding fatigue persists which she believes is multifactorial.   Labs independently reviewed: 04/2024 - TSH normal 03/2024 - BUN 16, serum creatinine 0.89, potassium 3.9, albumin 4.5, AST 57, ALT 60 06/2023 - Hgb 15.9, PLT 287 05/2023 - magnesium  1.8 08/2022 - TC 76, TG 151, HDL 27, LDL 19  Past Medical History:  Diagnosis Date   Cervical  radiculopathy at C5 01/27/2016   left   Coronary artery disease 08/2015   Single-vessel CAD of RCA with distal lesion treated with DES PCI and PDA now occluded.   Depression    GERD (gastroesophageal reflux disease)    History of Inferior STEMI 08/2015   Ulcerated plaque in the distal RCA with distal embolization PDA.  Distal RCA treated with DES PCI and PTCA of distal PDA (PDA now occluded   History of kidney stones    Hyperlipidemia LDL goal <70    Hypertension    Hypertriglyceridemia    Left arm weakness    OCD (obsessive compulsive disorder)    Sleep apnea     Past Surgical History:  Procedure Laterality Date   ANTERIOR CERVICAL DECOMP/DISCECTOMY FUSION N/A 05/01/2017   Procedure: Anterior Cervical Decompression Fusion Cervical Four-Five Cervical Five-Six;  Surgeon: Alix Charleston, MD;  Location: [facility] OR;  Service: Neurosurgery;  Laterality: N/A;  Anterior Cervical Decompression Fusion Cervical Four-Five Cervical Five-Six   CARDIAC CATHETERIZATION N/A 09/15/2015   Procedure: Left Heart Cath and Coronary Angiography;  Surgeon: Debby DELENA Sor, MD; LAD 30%, pRCA 30%, dRCA 30%, RPDA 99%, nl LV function  = DES PCI dRCA & PTCA of PDA   CARDIAC CATHETERIZATION N/A 09/15/2015   Procedure: Coronary Stent Intervention;  Surgeon: Debby DELENA Sor, MD; 3.515 mm Xience Alpine DES to dRCA/RPDA>> 5% residual      LEFT HEART CATH AND CORONARY ANGIOGRAPHY N/A 11/28/2016   Procedure: Left Heart Cath and Coronary Angiography;  Surgeon: Debby DELENA Sor, MD;  Location: [facility];  Service: Cardiovascular;  prox RCA 25%, patent dRCA stent, 100% m-dPDA (L-R collaterals); Ost D2 40% & m-dLAD 25%.   TONSILLECTOMY      Current Medications: Active Medications[1]  Allergies:   Benicar  [olmesartan ], Coreg [carvedilol], Mounjaro [tirzepatide], Ozempic (0.25 or 0.5 mg-dose) [semaglutide(0.25 or 0.5mg -dos)], Amoxil [amoxicillin], and Penicillins   Social History   Socioeconomic History   Marital  status: Married    Spouse name: Not on file   Number of children: 3   Years of education: Some Coll   Highest education level: Not on file  Occupational History   Occupation: Occupational Psychologist: [employer]  Tobacco Use   Smoking status: Never   Smokeless tobacco: Never  Vaping Use   Vaping status: Never Used  Substance and Sexual Activity   Alcohol  use: Yes    Alcohol /week: 0.0 standard drinks of alcohol     Comment: Rarely   Drug use: No   Sexual activity: Yes  Other Topics Concern   Not on file  Social History Narrative   Multiple family members on his father's side have/had CAD at a young age, uncles and grandparents.   Lives at home with his wife and three children.   Right-handed.   2-3 cups caffeine per week.   Social Drivers of Health   Tobacco Use: Low Risk (06/08/2024)   Patient History    Smoking Tobacco Use: Never    Smokeless Tobacco Use: Never  Passive Exposure: Not on file  Financial Resource Strain: High Risk (07/23/2023)   Overall Financial Resource Strain (CARDIA)    Difficulty of Paying Living Expenses: Hard  Food Insecurity: No Food Insecurity (07/23/2023)   Hunger Vital Sign    Worried About Running Out of Food in the Last Year: Never true    Ran Out of Food in the Last Year: Never true  Transportation Needs: No Transportation Needs (07/23/2023)   PRAPARE - Administrator, Civil Service (Medical): No    Lack of Transportation (Non-Medical): No  Physical Activity: Not on file  Stress: Not on file  Social Connections: Not on file  Depression (EYV7-0): Not on file  Alcohol  Screen: Not on file  Housing: Low Risk (07/23/2023)   Housing    Last Housing Risk Score: 0  Utilities: Not At Risk (07/23/2023)   AHC Utilities    Threatened with loss of utilities: No  Health Literacy: Not on file     Family History:  The patient's family history includes Diabetes in his maternal grandfather and paternal grandfather; Heart attack  (age of onset: 38) in his father; Heart disease in his paternal grandfather; Hyperlipidemia in his sister; Hypertension in his father, maternal grandfather, maternal grandmother, paternal grandfather, and paternal grandmother; Skin cancer in his mother; Stroke in his father and paternal grandmother.  ROS:   12-point review of systems is negative unless otherwise noted in the HPI.   EKGs/Labs/Other Studies Reviewed:    Studies reviewed were summarized above. The additional studies were reviewed today:  2D echo 08/05/2024: Formal read pending ___________  LHC/06/2017: Dist RCA lesion, 0 %stenosed. RPDA lesion, 100 %stenosed. Ost 2nd Diag to 2nd Diag lesion, 40 %stenosed. Mid LAD to Dist LAD lesion, 25 %stenosed. Ost RCA to Prox RCA lesion, 25 %stenosed. The left ventricular ejection fraction is 55-65% by visual estimate. The left ventricular systolic function is normal. LV end diastolic pressure is normal.   Normal LV function without significant wall motion abnormalities.   Mild CAD with 40% smooth ostial narrowing of the DX1 and 25% mid LAD stenosis; normal ramus and left circumflex vessel; mild 25% proximal RCA narrrowing with widely patent RCA stent beyond the acute margin and total mid occlusion of a small PDA vessel with antegrade and retrograde collateralization.   RECOMMENDATION: Medical therapy.  The patient will be given clearance for his cervical neck surgery. He will need to hold ASA/Brilinta  for at least 5-7 days prior to neurosurgery.  __________   Nuclear stress test 09/20/2016:   Nuclear stress EF: 54%. The left ventricular ejection fraction is mildly decreased (45-54%). There was no ST segment deviation noted during stress. Defect 1: There is a medium defect of severe severity present in the basal inferior and mid inferior location. This is a low risk study.   Low risk stress nuclear study with mild ischemia in the inferior wall; EF 54 with mild hypokinesis of the  mid inferior wall. __________   2D echo 09/18/2015: - Left ventricle: The cavity size was normal. There was severe    focal basal and moderate concentric hypertrophy of the let    ventricle. Systolic function was vigorous. The estimated ejection    fraction was in the range of 65% to 70%. Wall motion was normal;    there were no regional wall motion abnormalities. Doppler    parameters are consistent with abnormal left ventricular    relaxation (grade 1 diastolic dysfunction). Doppler parameters    are consistent with  elevated ventricular end-diastolic filling    pressure.  - Aortic valve: There was no regurgitation.  - Aortic root: The aortic root was normal in size.  - Mitral valve: There was no regurgitation.  - Left atrium: The atrium was normal in size.  - Right ventricle: Systolic function was normal.  - Right atrium: The atrium was normal in size.  - Tricuspid valve: There was trivial regurgitation.  - Pulmonic valve: There was no regurgitation.  - Pulmonary arteries: Systolic pressure was within the normal    range.  - Inferior vena cava: The vessel was normal in size.  - Pericardium, extracardiac: There was no pericardial effusion.   Impressions:   - The study more consistent with hypertensive heart disease rather    than hypertrophic cardiomyopathy.  __________   LHC 09/15/2015: Mid LAD to Dist LAD lesion, 30% stenosed. Ost RCA to Prox RCA lesion, 30% stenosed. Dist RCA lesion, 30% stenosed. Post intervention, there is a 0% residual stenosis. RPDA lesion, 99% stenosed. Post intervention, there is a 5% residual stenosis. The left ventricular systolic function is normal.   Acute inferior ST segment elevation myocardial infarction secondary to an ulcerated plaque in the distal RCA with evidence for distal embolization to the mid PDA vessel.   Coronary anatomy demonstrating a normal left main coronary artery, large LAD vessel with 30% smooth mid narrowing after the second  diagonal branch; normal ramus intermediate vessel; normal left circumflex coronary artery; and large dominant RCA with mild 30% narrowing, an ulcerated plaque beyond the acute margin before the PDA takeoff and 99% distal PDA stenosis.   Successful acute coronary intervention to the RCA with PTCA of the PDA vessel with a 99% stenosis being reduced to 5%, and primary stenting of the ulcerated plaque being reduced to 0% with insertion of a 3.515 mm Xience Alpine DES stent postdilated to 3.71 mm.   RECOMMENDATION: The patient will be on dual antiplatelet therapy for minimum of 1 year.  He will need to be treated with aggressive lipid lowering therapy as well as blood pressure control with beta blocker and ACE-I.   EKG:  EKG is not ordered today.    Recent Labs: 03/13/2024: BUN 13; Creatinine, Ser 0.90; Potassium CANCELED; Sodium 130  Recent Lipid Panel    Component Value Date/Time   CHOL 76 09/07/2022 1200   CHOL 122 02/21/2022 1213   TRIG 151 (H) 09/07/2022 1200   HDL 27 (L) 09/07/2022 1200   HDL 32 (L) 02/21/2022 1213   CHOLHDL 2.8 09/07/2022 1200   VLDL 30 09/07/2022 1200   LDLCALC 19 09/07/2022 1200   LDLCALC 28 02/21/2022 1213    PHYSICAL EXAM:    VS:  BP 126/75   Pulse 83   Ht 5' 9 (1.753 m)   Wt 251 lb 6.4 oz (114 kg)   SpO2 97%   BMI 37.13 kg/m   BMI: Body mass index is 37.13 kg/m.  Physical Exam Constitutional:      Appearance: He is well-developed.  HENT:     Head: Normocephalic and atraumatic.  Eyes:     General:        Right eye: No discharge.        Left eye: No discharge.  Cardiovascular:     Rate and Rhythm: Normal rate and regular rhythm.     Heart sounds: Normal heart sounds, S1 normal and S2 normal. Heart sounds not distant. No midsystolic click and no opening snap. No murmur heard.    No  friction rub.  Pulmonary:     Effort: Pulmonary effort is normal. No respiratory distress.     Breath sounds: Normal breath sounds. No decreased breath sounds,  wheezing, rhonchi or rales.  Musculoskeletal:     Cervical back: Normal range of motion.  Skin:    General: Skin is warm and dry.     Nails: There is no clubbing.  Neurological:     Mental Status: He is alert and oriented to person, place, and time.  Psychiatric:        Speech: Speech normal.        Behavior: Behavior normal.        Thought Content: Thought content normal.        Judgment: Judgment normal.     Wt Readings from Last 3 Encounters:  08/05/24 251 lb 6.4 oz (114 kg)  06/08/24 256 lb 2 oz (116.2 kg)  04/15/24 253 lb (114.8 kg)     ASSESSMENT & PLAN:   Resistant hypertension with associated orthostasis: Blood pressure is well-controlled in the office today with some improvement in positional dizziness.  Continue amlodipine  10 mg, chlorthalidone  25 mg, Bystolic  5 mg, and valsartan  160 mg.  CAD involving the native coronary arteries with chest pain moderate risk for cardiac etiology: He reports some intermittent atypical chest pain that appears to be consistent with musculoskeletal etiology.  However, he also reports an episode of chest discomfort while hiking recently.  These episodes of chest discomfort did not feel similar to prior angina.  Currently without symptoms of angina.  Schedule treadmill MPI to evaluate for high risk ischemia.  Otherwise, continue aggressive risk factor modification and secondary prevention including aspirin  81 mg, rosuvastatin  20 mg, and Vascepa  1 g twice daily.  HLD/hypertriglyceridemia: LDL 19 in 08/2022.  Remains on rosuvastatin  20 mg and Vascepa  1 g twice daily.  PCP is working with prior authorization for Vascepa , notified patient to contact our office if there are any issues with this medication.  Placed future orders for fasting lipid panel and CMP.  Has historically been hesitant to try PCSK9 inhibitor due to concern for off target effect.  Obesity with OSA: Heart healthy diet and regular exercise recommended.  Continue CPAP.  This was not  discussed at today's visit.   Informed Consent   Shared Decision Making/Informed Consent{  The risks [chest pain, shortness of breath, cardiac arrhythmias, dizziness, blood pressure fluctuations, myocardial infarction, stroke/transient ischemic attack, nausea, vomiting, allergic reaction, radiation exposure, metallic taste sensation and life-threatening complications (estimated to be 1 in 10,000)], benefits (risk stratification, diagnosing coronary artery disease, treatment guidance) and alternatives of a nuclear stress test were discussed in detail with Mr. Larmon and he agrees to proceed.        Disposition: F/u with Dr. Anner or an APP in 6 months, sooner if needed.   Medication Adjustments/Labs and Tests Ordered: Current medicines are reviewed at length with the patient today.  Concerns regarding medicines are outlined above. Medication changes, Labs and Tests ordered today are summarized above and listed in the Patient Instructions accessible in Encounters.   SignedBernardino Bring, PA-C 08/05/2024 4:23 PM     [facility] [place] [address] [place], [place] [address] [phone]     [1]  Current Meds  Medication Sig   amLODipine  (NORVASC ) 10 MG tablet Take 1 tablet by mouth every evening.   aspirin  EC 81 MG EC tablet Take 1 tablet (81 mg total) by mouth daily. (Patient taking  differently: Take 81 mg by mouth every evening.)   chlorthalidone  (HYGROTON ) 25 MG tablet Take 1 tablet (25 mg total) by mouth daily.   Cholecalciferol (VITAMIN D3) 1.25 MG (50000 UT) CAPS Take 1.25 mg by mouth every Friday.   LANTUS SOLOSTAR 100 UNIT/ML Solostar Pen Inject 12 Units into the skin daily.   Magnesium  Gluconate 250 MG TABS Take 1 tablet by mouth daily.   naproxen sodium (ALEVE) 220 MG tablet Take 660 mg by mouth daily as needed (pain).   nebivolol  (BYSTOLIC ) 5 MG tablet Take 1 tablet (5 mg total) by mouth daily.   nitroGLYCERIN  (NITROSTAT ) 0.4 MG SL tablet  Place 1 tablet (0.4 mg total) under the tongue every 5 (five) minutes as needed for chest pain (CP or SOB).   potassium chloride  SA (KLOR-CON  M) 20 MEQ tablet Take 1 tablet (20 mEq total) by mouth daily.   rosuvastatin  (CRESTOR ) 20 MG tablet Take 1 tablet (20 mg total) by mouth daily.   valsartan  (DIOVAN ) 160 MG tablet Take 1 tablet (160 mg total) by mouth daily.

## 2024-08-05 ENCOUNTER — Ambulatory Visit: Attending: Physician Assistant | Admitting: Physician Assistant

## 2024-08-05 ENCOUNTER — Ambulatory Visit

## 2024-08-05 VITALS — BP 126/75 | HR 83 | Ht 69.0 in | Wt 251.4 lb

## 2024-08-05 DIAGNOSIS — I951 Orthostatic hypotension: Secondary | ICD-10-CM | POA: Diagnosis not present

## 2024-08-05 DIAGNOSIS — G4733 Obstructive sleep apnea (adult) (pediatric): Secondary | ICD-10-CM | POA: Diagnosis not present

## 2024-08-05 DIAGNOSIS — R079 Chest pain, unspecified: Secondary | ICD-10-CM | POA: Diagnosis not present

## 2024-08-05 DIAGNOSIS — E781 Pure hyperglyceridemia: Secondary | ICD-10-CM | POA: Diagnosis not present

## 2024-08-05 DIAGNOSIS — I25118 Atherosclerotic heart disease of native coronary artery with other forms of angina pectoris: Secondary | ICD-10-CM

## 2024-08-05 DIAGNOSIS — E785 Hyperlipidemia, unspecified: Secondary | ICD-10-CM | POA: Diagnosis not present

## 2024-08-05 DIAGNOSIS — I1A0 Resistant hypertension: Secondary | ICD-10-CM

## 2024-08-05 DIAGNOSIS — R0602 Shortness of breath: Secondary | ICD-10-CM | POA: Diagnosis not present

## 2024-08-05 DIAGNOSIS — Z6837 Body mass index (BMI) 37.0-37.9, adult: Secondary | ICD-10-CM | POA: Diagnosis not present

## 2024-08-05 DIAGNOSIS — E66812 Obesity, class 2: Secondary | ICD-10-CM

## 2024-08-05 LAB — ECHOCARDIOGRAM COMPLETE
AR max vel: 3.03 cm2
AV Area VTI: 3.22 cm2
AV Area mean vel: 2.97 cm2
AV Mean grad: 4 mmHg
AV Peak grad: 7.1 mmHg
Ao pk vel: 1.33 m/s
Area-P 1/2: 4.15 cm2
Calc EF: 56.2 %
S' Lateral: 3.3 cm
Single Plane A2C EF: 53.5 %
Single Plane A4C EF: 59.9 %

## 2024-08-05 NOTE — Patient Instructions (Signed)
 Medication Instructions:  Your physician recommends that you continue on your current medications as directed. Please refer to the Current Medication list given to you today.   *If you need a refill on your cardiac medications before your next appointment, please call your pharmacy*  Lab Work: Your provider would like for you to return in asap to have the following labs drawn: fasting lipid and LFT.   Please go to [facility] [address] ([facility]) [address], [place] [address] You do not need an appointment.  They are open from 8 am- 4:30 pm.  Lunch from 1:00 pm- 2:00 pm You DO need to be fasting.  If you have labs (blood work) drawn today and your tests are completely normal, you will receive your results only by: MyChart Message (if you have MyChart) OR A paper copy in the mail If you have any lab test that is abnormal or we need to change your treatment, we will call you to review the results.  Testing/Procedures: Your provider has ordered a Lexiscan / Exercise Myoview  Stress test. This will take place at [facility]. Please report to the [facility] medical mall entrance. The volunteers at the first desk will direct you where to go.  [facility] MYOVIEW   Your provider has ordered a Stress Test with nuclear imaging. The purpose of this test is to evaluate the blood supply to your heart muscle. This procedure is referred to as a Non-Invasive Stress Test. This is because other than having an IV started in your vein, nothing is inserted or invades your body. Cardiac stress tests are done to find areas of poor blood flow to the heart by determining the extent of coronary artery disease (CAD). Some patients exercise on a treadmill, which naturally increases the blood flow to your heart, while others who are unable to walk on a treadmill due to physical limitations will have a pharmacologic/chemical stress agent called Lexiscan  . This medicine will mimic walking on a treadmill by  temporarily increasing your coronary blood flow.   Please note: these test may take anywhere between 2-4 hours to complete  How to prepare for your Myoview  test:  Nothing to eat for 6 hours prior to the test No caffeine for 24 hours prior to test No smoking 24 hours prior to test. Your medication may be taken with water.  If your doctor stopped a medication because of this test, do not take that medication. Ladies, please do not wear dresses.  Skirts or pants are appropriate. Please wear a short sleeve shirt. No perfume, cologne or lotion. Wear comfortable walking shoes. No heels!   PLEASE NOTIFY THE OFFICE AT LEAST 24 HOURS IN ADVANCE IF YOU ARE UNABLE TO KEEP YOUR APPOINTMENT.  [phone] AND  PLEASE NOTIFY NUCLEAR MEDICINE AT [facility] AT LEAST 24 HOURS IN ADVANCE IF YOU ARE UNABLE TO KEEP YOUR APPOINTMENT. [phone]   Follow-Up: At [facility], you and your health needs are our priority.  As part of our continuing mission to provide you with exceptional heart care, our providers are all part of one team.  This team includes your primary Cardiologist (physician) and Advanced Practice Providers or APPs (Physician Assistants and Nurse Practitioners) who all work together to provide you with the care you need, when you need it.  Your next appointment:   6 month(s)  Provider:   Bernardino Bring, PA-C    We recommend signing up for the patient portal called MyChart.  Sign up information is provided on this After  Visit Summary.  MyChart is used to connect with patients for Virtual Visits (Telemedicine).  Patients are able to view lab/test results, encounter notes, upcoming appointments, etc.  Non-urgent messages can be sent to your provider as well.   To learn more about what you can do with MyChart, go to [email or web address].

## 2024-08-06 ENCOUNTER — Ambulatory Visit: Payer: Self-pay | Admitting: Physician Assistant

## 2024-08-11 ENCOUNTER — Encounter
Admission: RE | Admit: 2024-08-11 | Discharge: 2024-08-11 | Disposition: A | Discharge status: Home / Self Care | Source: Ambulatory Visit | Attending: Physician Assistant | Admitting: Physician Assistant

## 2024-08-11 ENCOUNTER — Ambulatory Visit: Payer: Self-pay | Admitting: Physician Assistant

## 2024-08-11 DIAGNOSIS — I25118 Atherosclerotic heart disease of native coronary artery with other forms of angina pectoris: Secondary | ICD-10-CM | POA: Insufficient documentation

## 2024-08-11 DIAGNOSIS — R079 Chest pain, unspecified: Secondary | ICD-10-CM | POA: Diagnosis not present

## 2024-08-11 LAB — NM MYOCAR MULTI W/SPECT W/WALL MOTION / EF
Angina Index: 0
Duke Treadmill Score: 10
Estimated workload: 10.6
Exercise duration (min): 10 min
Exercise duration (sec): 0 s
LV dias vol: 115 mL (ref 62–150)
LV sys vol: 63 mL
MPHR: 147 {beats}/min
Nuc Stress EF: 63 %
Peak HR: 151 {beats}/min
Percent HR: 87 %
Rest HR: 78 {beats}/min
Rest Nuclear Isotope Dose: 10 mCi
SDS: 4
SRS: 2
SSS: 3
ST Depression (mm): 0.5 mm
Stress Nuclear Isotope Dose: 30.5 mCi
TID: 0.88

## 2024-08-11 MED ORDER — TECHNETIUM TC 99M TETROFOSMIN IV KIT
9.9700 | PACK | Freq: Once | INTRAVENOUS | Status: AC | PRN
  Administered 2024-08-11: 9.97 via INTRAVENOUS

## 2024-08-11 MED ORDER — TECHNETIUM TC 99M TETROFOSMIN IV KIT
30.5300 | PACK | Freq: Once | INTRAVENOUS | Status: AC | PRN
  Administered 2024-08-11: 30.53 via INTRAVENOUS

## 2024-08-16 DIAGNOSIS — G4733 Obstructive sleep apnea (adult) (pediatric): Secondary | ICD-10-CM | POA: Diagnosis not present

## 2024-09-06 ENCOUNTER — Other Ambulatory Visit: Payer: Self-pay | Admitting: Physician Assistant

## 2025-02-09 ENCOUNTER — Ambulatory Visit: Admitting: Physician Assistant
